# Patient Record
Sex: Male | Born: 1960 | Race: White | Hispanic: No | Marital: Single | Smoking: Current every day smoker
Health system: Southern US, Community
[De-identification: ages and names within clinical notes are randomized; demographics above are authoritative.]

## PROBLEM LIST (undated history)

## (undated) DIAGNOSIS — F1010 Alcohol abuse, uncomplicated: Secondary | ICD-10-CM

## (undated) DIAGNOSIS — M1990 Unspecified osteoarthritis, unspecified site: Secondary | ICD-10-CM

## (undated) DIAGNOSIS — Z720 Tobacco use: Secondary | ICD-10-CM

## (undated) DIAGNOSIS — E119 Type 2 diabetes mellitus without complications: Secondary | ICD-10-CM

## (undated) DIAGNOSIS — I4891 Unspecified atrial fibrillation: Secondary | ICD-10-CM

## (undated) DIAGNOSIS — I5022 Chronic systolic (congestive) heart failure: Secondary | ICD-10-CM

## (undated) DIAGNOSIS — C3490 Malignant neoplasm of unspecified part of unspecified bronchus or lung: Secondary | ICD-10-CM

## (undated) HISTORY — DX: Type 2 diabetes mellitus without complications: E11.9

## (undated) HISTORY — DX: Unspecified atrial fibrillation: I48.91

## (undated) HISTORY — DX: Chronic systolic (congestive) heart failure: I50.22

---

## 2000-10-22 ENCOUNTER — Encounter: Admission: RE | Admit: 2000-10-22 | Discharge: 2000-10-22 | Payer: Self-pay | Admitting: *Deleted

## 2000-10-22 ENCOUNTER — Encounter: Payer: Self-pay | Admitting: *Deleted

## 2011-01-17 ENCOUNTER — Other Ambulatory Visit: Payer: Self-pay

## 2011-01-17 ENCOUNTER — Emergency Department (HOSPITAL_COMMUNITY): Payer: BC Managed Care – PPO

## 2011-01-17 ENCOUNTER — Encounter: Payer: Self-pay | Admitting: Emergency Medicine

## 2011-01-17 ENCOUNTER — Inpatient Hospital Stay (HOSPITAL_COMMUNITY)
Admission: EM | Admit: 2011-01-17 | Discharge: 2011-01-22 | DRG: 544 | Disposition: A | Discharge status: Home / Self Care | Payer: BC Managed Care – PPO | Attending: Internal Medicine | Admitting: Internal Medicine

## 2011-01-17 DIAGNOSIS — I5022 Chronic systolic (congestive) heart failure: Secondary | ICD-10-CM

## 2011-01-17 DIAGNOSIS — E119 Type 2 diabetes mellitus without complications: Secondary | ICD-10-CM | POA: Diagnosis present

## 2011-01-17 DIAGNOSIS — Z6841 Body Mass Index (BMI) 40.0 and over, adult: Secondary | ICD-10-CM

## 2011-01-17 DIAGNOSIS — I429 Cardiomyopathy, unspecified: Secondary | ICD-10-CM

## 2011-01-17 DIAGNOSIS — I4891 Unspecified atrial fibrillation: Principal | ICD-10-CM | POA: Diagnosis present

## 2011-01-17 DIAGNOSIS — I5021 Acute systolic (congestive) heart failure: Secondary | ICD-10-CM | POA: Diagnosis present

## 2011-01-17 DIAGNOSIS — F17200 Nicotine dependence, unspecified, uncomplicated: Secondary | ICD-10-CM

## 2011-01-17 DIAGNOSIS — I509 Heart failure, unspecified: Secondary | ICD-10-CM | POA: Diagnosis present

## 2011-01-17 DIAGNOSIS — I519 Heart disease, unspecified: Secondary | ICD-10-CM | POA: Diagnosis present

## 2011-01-17 DIAGNOSIS — F1010 Alcohol abuse, uncomplicated: Secondary | ICD-10-CM | POA: Diagnosis present

## 2011-01-17 HISTORY — DX: Unspecified osteoarthritis, unspecified site: M19.90

## 2011-01-17 HISTORY — DX: Tobacco use: Z72.0

## 2011-01-17 HISTORY — DX: Alcohol abuse, uncomplicated: F10.10

## 2011-01-17 LAB — CBC
HCT: 42.4 % (ref 39.0–52.0)
Hemoglobin: 14.2 g/dL (ref 13.0–17.0)
MCH: 32.9 pg (ref 26.0–34.0)
MCHC: 33.5 g/dL (ref 30.0–36.0)
MCV: 98.4 fL (ref 78.0–100.0)
Platelets: 266 10*3/uL (ref 150–400)
RBC: 4.31 MIL/uL (ref 4.22–5.81)
RDW: 12.3 % (ref 11.5–15.5)
WBC: 9.2 10*3/uL (ref 4.0–10.5)

## 2011-01-17 LAB — DIFFERENTIAL
Basophils Absolute: 0.1 10*3/uL (ref 0.0–0.1)
Basophils Relative: 1 % (ref 0–1)
Lymphocytes Relative: 23 % (ref 12–46)
Monocytes Absolute: 1.1 10*3/uL — ABNORMAL HIGH (ref 0.1–1.0)
Neutro Abs: 5.8 10*3/uL (ref 1.7–7.7)
Neutrophils Relative %: 63 % (ref 43–77)

## 2011-01-17 LAB — PROTIME-INR: INR: 1.16 (ref 0.00–1.49)

## 2011-01-17 LAB — APTT: aPTT: 30 seconds (ref 24–37)

## 2011-01-17 LAB — COMPREHENSIVE METABOLIC PANEL
ALT: 18 U/L (ref 0–53)
AST: 16 U/L (ref 0–37)
Albumin: 3.5 g/dL (ref 3.5–5.2)
Alkaline Phosphatase: 98 U/L (ref 39–117)
CO2: 23 mEq/L (ref 19–32)
Chloride: 100 mEq/L (ref 96–112)
Creatinine, Ser: 0.68 mg/dL (ref 0.50–1.35)
GFR calc non Af Amer: >90 mL/min (ref >90)
Potassium: 4.1 mEq/L (ref 3.5–5.1)
Total Bilirubin: 1.3 mg/dL — ABNORMAL HIGH (ref 0.3–1.2)

## 2011-01-17 LAB — GLUCOSE, CAPILLARY: Glucose-Capillary: 173 mg/dL — ABNORMAL HIGH (ref 70–99)

## 2011-01-17 LAB — T4: T4, Total: 8.8 ug/dL (ref 5.0–12.5)

## 2011-01-17 LAB — CARDIAC PANEL(CRET KIN+CKTOT+MB+TROPI)
Relative Index: INVALID (ref 0.0–2.5)
Total CK: 71 U/L (ref 7–232)

## 2011-01-17 LAB — TSH: TSH: 1.561 u[IU]/mL (ref 0.350–4.500)

## 2011-01-17 MED ORDER — METOPROLOL TARTRATE 1 MG/ML IV SOLN
5.0000 mg | Freq: Four times a day (QID) | INTRAVENOUS | Status: DC
  Administered 2011-01-17 – 2011-01-19 (×7): 5 mg via INTRAVENOUS
  Filled 2011-01-17 (×10): qty 5

## 2011-01-17 MED ORDER — DIGOXIN 0.25 MG/ML IJ SOLN
0.5000 mg | Freq: Once | INTRAMUSCULAR | Status: AC
  Administered 2011-01-17: 0.5 mg via INTRAVENOUS
  Filled 2011-01-17: qty 2

## 2011-01-17 MED ORDER — SODIUM CHLORIDE 0.9 % IV SOLN
Freq: Once | INTRAVENOUS | Status: AC
Start: 2011-01-17 — End: 2011-01-17
  Administered 2011-01-17: 13:00:00 via INTRAVENOUS

## 2011-01-17 MED ORDER — WARFARIN SODIUM 10 MG PO TABS
10.0000 mg | ORAL_TABLET | Freq: Once | ORAL | Status: AC
  Administered 2011-01-18: 10 mg via ORAL
  Filled 2011-01-17: qty 1

## 2011-01-17 MED ORDER — HEPARIN (PORCINE) IN NACL 100-0.45 UNIT/ML-% IJ SOLN
2200.0000 [IU]/h | INTRAMUSCULAR | Status: DC
  Administered 2011-01-17: 1700 [IU]/h via INTRAVENOUS
  Administered 2011-01-18: 2200 [IU]/h via INTRAVENOUS
  Filled 2011-01-17 (×5): qty 250

## 2011-01-17 MED ORDER — DILTIAZEM HCL 100 MG IV SOLR
5.0000 mg/h | INTRAVENOUS | Status: AC
  Administered 2011-01-17: 5 mg/h via INTRAVENOUS
  Filled 2011-01-17: qty 100

## 2011-01-17 MED ORDER — DILTIAZEM HCL 25 MG/5ML IV SOLN
15.0000 mg | Freq: Once | INTRAVENOUS | Status: AC
  Administered 2011-01-17: 15 mg via INTRAVENOUS

## 2011-01-17 MED ORDER — HEPARIN BOLUS VIA INFUSION
5000.0000 [IU] | Freq: Once | INTRAVENOUS | Status: AC
  Administered 2011-01-17: 5000 [IU] via INTRAVENOUS
  Filled 2011-01-17: qty 5000

## 2011-01-17 MED ORDER — ALBUTEROL SULFATE (5 MG/ML) 0.5% IN NEBU
INHALATION_SOLUTION | RESPIRATORY_TRACT | Status: AC
  Administered 2011-01-17: 12:00:00
  Filled 2011-01-17: qty 1

## 2011-01-17 MED ORDER — DEXTROSE 5 % IV SOLN
5.0000 mg/h | INTRAVENOUS | Status: DC
  Administered 2011-01-17 – 2011-01-18 (×2): 5 mg/h via INTRAVENOUS
  Filled 2011-01-17 (×2): qty 100

## 2011-01-17 NOTE — ED Notes (Signed)
Ordered pt dinner tray and gave him a glass of water

## 2011-01-17 NOTE — ED Notes (Signed)
Attempt to call report x 3, RN remains unable.  Asked to speak to charge RN, was told no.  ED CN made aware, called unit and was told that the RN was going to call back in 10 minutes.

## 2011-01-17 NOTE — Progress Notes (Signed)
ANTICOAGULATION CONSULT NOTE - Initial Consult  Pharmacy Consult for Heparin Indication: atrial fibrillation  No Known Allergies  Patient Measurements: Height: 5\' 10"  (177.8 cm) Weight: 310 lb (140.615 kg) IBW/kg (Calculated) : 73  Adjusted Body Weight: 106 kg  Vital Signs: Temp: 98 F (36.7 C) (11/23 1820) Temp src: Oral (11/23 1820) BP: 126/89 mmHg (11/23 1828) Pulse Rate: 109  (11/23 1820)  Labs:  Basename 01/17/11 1251 01/17/11 1247  HGB -- 14.2  HCT -- 42.4  PLT -- 266  APTT -- --  LABPROT -- --  INR -- --  HEPARINUNFRC -- --  CREATININE -- 0.68  CKTOTAL 71 --  CKMB 4.0 --  TROPONINI <0.30 --   Estimated Creatinine Clearance: 152.8 ml/min (by C-G formula based on Cr of 0.68).  Medical History: Past Medical History  Diagnosis Date  . Hyperglycemia     Noted in ER this admission. Strong family history of DM.  Marland Kitchen Alcohol abuse   . Tobacco abuse     Medications:  None PTA  Assessment: 50 yo male admitted with Afib/RVR - to be started on anticoagulation.  He has known history for ETOH use (8cans/week) but LFT's/Tbili stable.  He is morbid obese and heparin will be dosed using adjusted body weight.  Goal of Therapy:  Heparin level 0.3-0.7 units/ml INR 2.0-3.0   Plan:  1)  Heparin 5000 units IV bolus x1 2)  Heparin 1700 units/hr 3)  Warfarin 10mg  x 1 4)  Heparin level in six hours 5)  PT/INR daily 6)  Monitor for s/s of bleeding.  Nadara Mustard [place] 01/17/2011,6:48 PM

## 2011-01-17 NOTE — ED Notes (Signed)
Spoke with pt about taking asa, pt continues to refuse due to an ulcer that he has.  Pt reassured.

## 2011-01-17 NOTE — ED Notes (Signed)
Attempt to call report x 2, RN hanging blood, unable.  To call back.

## 2011-01-17 NOTE — ED Notes (Signed)
Attempt to call report x 1, RN unable.  

## 2011-01-17 NOTE — ED Provider Notes (Signed)
History     CSN: [id number] Arrival date & time: 01/17/2011 11:55 AM   First MD Initiated Contact with Patient 01/17/11 1220      Chief Complaint  Patient presents with  . Atrial Fibrillation  . Wheezing    (Consider location/radiation/quality/duration/timing/severity/associated sxs/prior treatment) Patient is a 50 y.o. male presenting with atrial fibrillation and wheezing. The history is provided by the patient.  Atrial Fibrillation  Wheezing  Associated symptoms include wheezing.   patient with acute onset of his dyspnea for 48 hours. He notes orthopnea and dyspnea on exertion. Denies any chest pain or chest pressure. Does note some lower extremity swelling, it denies any palpitations. No syncopal events noted. No prior history of atrial fibrillation or congestive heart failure. Patient went to his primary care doctor's office today where he was found to be in possible heart failure with wheezing and new onset atrial fibrillation, EMS was called patient given oxygen and transported here.  History reviewed. No pertinent past medical history.  No past surgical history on file.  No family history on file.  History  Substance Use Topics  . Smoking status: Not on file  . Smokeless tobacco: Not on file  . Alcohol Use: Not on file      Review of Systems  Respiratory: Positive for wheezing.   All other systems reviewed and are negative.    Allergies  Review of patient's allergies indicates no known allergies.  Home Medications  No current outpatient prescriptions on file.  BP 126/95  Pulse 164  Temp(Src) 98 F (36.7 C) (Oral)  Resp 22  SpO2 97%  Physical Exam  Nursing note and vitals reviewed. Constitutional: He is oriented to person, place, and time. He appears well-developed and well-nourished.  Non-toxic appearance. No distress.  HENT:  Head: Normocephalic and atraumatic.  Eyes: Conjunctivae, EOM and lids are normal. Pupils are equal, round, and reactive to  light.  Neck: Normal range of motion. Neck supple. No tracheal deviation present. No mass present.  Cardiovascular: Normal heart sounds.  An irregularly irregular rhythm present. Tachycardia present.  Exam reveals no gallop.   No murmur heard. Pulmonary/Chest: No stridor. Tachypnea noted. No respiratory distress. He has no decreased breath sounds. He has wheezes. He has rhonchi. He has no rales.  Abdominal: Soft. Normal appearance and bowel sounds are normal. He exhibits no distension. There is no tenderness. There is no rebound and no CVA tenderness.  Musculoskeletal: Normal range of motion. He exhibits no edema and no tenderness.  Neurological: He is alert and oriented to person, place, and time. He has normal strength. No cranial nerve deficit or sensory deficit. GCS eye subscore is 4. GCS verbal subscore is 5. GCS motor subscore is 6.  Skin: Skin is warm and dry. No abrasion and no rash noted.  Psychiatric: He has a normal mood and affect. His speech is normal and behavior is normal.    ED Course  Procedures (including critical care time)   Labs Reviewed  CBC  DIFFERENTIAL  COMPREHENSIVE METABOLIC PANEL  PRO B NATRIURETIC PEPTIDE  CARDIAC PANEL(CRET KIN+CKTOT+MB+TROPI)   No results found.   No diagnosis found.    MDM   Date: 01/17/2011  Rate: 156  Rhythm: atrial fibrillation  QRS Axis: normal  Intervals: normal  ST/T Wave abnormalities: nonspecific ST changes  Conduction Disutrbances:none  Narrative Interpretation:   Old EKG Reviewed: none available   2:05 PM Patient given Cardizem IV push 20 mg and started on a Cardizem drip at 5 mg  per minute. We assessed 30 minutes later and remains in A. fib with a rapid response of between 1:30 to 140. The patient's Cardizem drip rate increased to 2 mg per minute. Patient remains hemodynamically stable. Discussed with cardiology will come to admit patient  CRITICAL CARE Performed by: Toy Baker   Total critical care  time: 75  Critical care time was exclusive of separately billable procedures and treating other patients.  Critical care was necessary to treat or prevent imminent or life-threatening deterioration.  Critical care was time spent personally by me on the following activities: development of treatment plan with patient and/or surrogate as well as nursing, discussions with consultants, evaluation of patient's response to treatment, examination of patient, obtaining history from patient or surrogate, ordering and performing treatments and interventions, ordering and review of laboratory studies, ordering and review of radiographic studies, pulse oximetry and re-evaluation of patient's condition.        Toy Baker, MD 01/17/11 [phone]

## 2011-01-17 NOTE — ED Notes (Signed)
Pt here with wheezing and SOB for 1 month, went to dr today for bilateral swollen lower extremities for past 3 days.  Put on monitor at dr's and found to be in afib with rvr- rate 150-180

## 2011-01-17 NOTE — ED Notes (Signed)
Assumed care of pt.  No distress noted.  Pt sitting on the side of bed.  Pt denies needs, SOB or pain.  Skin warm, dry and intact.  Updated on POC.

## 2011-01-17 NOTE — H&P (Signed)
History and Physical  Patient ID: Bryan Rice MRN: [id number], DOB/AGE: [birth year] 52 y.o. Date of Encounter: 01/17/2011  Primary Physician: No current PCP. Today was his first appointment with Dr. Cathey Endow. Primary Cardiologist: New, being seen by Dr. Tenny Craw  Chief Complaint: shortness of breath Reason for Admission: new onset atrial fibrillation with RVR  HPI: Mr. Bryan Rice is a 50 y/o M with no prior cardiac history but hx of EtOH abuse, tobacco abuse, and hyperglycemia noted here in the ER who presents with a 3-week history of worsening dyspnea on exertion. He denies any SOB at rest, but has noted over the last several days he is unable to do his ADLs without becoming quite dyspneic. He also has had a nonproductive cough and possibly some orthopnea. About 3 weeks ago, he had several episodes of PND during the night which he attributed to a clogged furnace filter, but even after changing it, he still hasn't felt back to baseline. 2 days ago he woke up with "elephant legs" (LEE) which was much worse than usual. This has since gone down, but he made an appointment to establish care today with a PCP to be evaluated. At that visit he was found to be in atrial fibrillation with RVR, HR in the 160's. He was subsequently transferred to [facility]. He received 20mg  IV diltiazem bolus followed by 5mg /hr gtt which was uptitrated to 10mg /hr, and is receiving another 15mg  bolus per the EDP since his HR is still in the 130's. The patient is completely asymptomatic at rest. He is unaware of his heart rhythm/rate and denies any palpitations or chest pain whatsoever. CE's neg x 1, BNP 1218, glucose 170, and CXR shows a R pleural effusion.  Past Medical History  Diagnosis Date  . Hyperglycemia     Noted in ER this admission. Strong family history of DM.  Marland Kitchen Alcohol abuse   . Tobacco abuse    No prior cardiac workup, or history of bleeding problems or TIA/CVA  Surgical History: None  Home Meds:  None  Allergies: No Known Allergies  History   Social History  . Marital Status: Single. Lives alone   Occupational History  . Service technician     Working with [employer]   Social History Main Topics  . Smoking status: Current Everyday Smoker -- 2.0 packs/day for 35 years    Types: Cigarettes  . Smokeless tobacco: Not on file  . Alcohol Use: 4.8 oz/week    8 Cans of beer per week     In winter, 4 beers 1-2x/week. In summer, >6 beers daily.  . Drug Use: No  . Sexually Active: Not on file   Family History  Problem Relation Age of Onset  . Diabetes Father     Multiple relatives in his family as well  . Peripheral vascular disease Mother     Died from complications from an aortic surgery   Review of Systems: General: negative for chills, fever, night sweats Cardiovascular: negative for chest pain or palpitations. Positive for dyspnea on exertion, edema, orthopnea, paroxysmal nocturnal dyspnea Dermatological: negative for rash Respiratory: Positive for nonproductive cough Urologic: negative for hematuria Abdominal: negative for nausea, vomiting, diarrhea, bright red blood per rectum, melena, or hematemesis Neurologic: negative for visual changes, syncope, or dizziness All other systems reviewed and are otherwise negative except as noted above.  Labs:   Lab Results  Component Value Date   WBC 9.2 01/17/2011   HGB 14.2 01/17/2011  HCT 42.4 01/17/2011   MCV 98.4 01/17/2011   PLT 266 01/17/2011    Lab 01/17/11 1247  NA 134*  K 4.1  CL 100  CO2 23  BUN 10  CREATININE 0.68  CALCIUM 9.4  PROT 7.3  BILITOT 1.3*  ALKPHOS 98  ALT 18  AST 16  GLUCOSE 170*    Basename 01/17/11 1251  CKTOTAL 71  CKMB 4.0  TROPONINI <0.30   Radiology/Studies:  1. Chest 2 View 01/17/2011 IMPRESSION:  1.  Right pleural effusion with decreased aeration to the right lung base.     EKG: Atrial fibrillation with RVR 156 bpm with Q's anteriorly  and NSST changes (difficult to tell, but appears that he has TWI I, avL, V5-V6). Otherwise no acute changes  Physical Exam: Blood pressure 126/95, pulse 164, temperature 98 F (36.7 C), temperature source Oral, resp. rate 22, SpO2 97.00%. General: Overweight WM, well developed in no acute distress. Head: Normocephalic, atraumatic, sclera are mildly icteric. No xanthomas, nares are without discharge.  Neck: Negative for carotid bruits. JVD is elevated. Lungs: Absent breath sounds at the right base to about 1/3 of the way up. Faint rales right base. Otherwise no wheezes or rhonchi. Breathing is unlabored while sitting stationary, but does have noticeable increased WOB after changing positions Heart: Irregularly irregular, tachycardic with S1 S2. No murmurs, rubs, or gallops appreciated. Abdomen: Obese, soft, non-tender, non-distended with normoactive bowel sounds. No hepatomegaly. No rebound/guarding. No obvious abdominal masses. Msk:  Strength and tone appear normal for age. Extremities: No clubbing or cyanosis. 1+ LEE with some erythematous stasis changes on LEs.  Distal pedal pulses are 1+ and equal bilaterally. Neuro: Alert and oriented X 3. Moves all extremities spontaneously. Psych:  Responds to questions appropriately with a normal affect.   ASSESSMENT AND PLAN:  1. New onset atrial fibrillation with RVR, duration unknown. This is likely what has been driving his underlying DOE. CHADS2 score pre-admission would've been 0, but he may have newly diagnosed CHF and diabetes this admission. Will discuss anticoagulation with MD in light of his EtOH use (although H/H are normal). He needs a different strategy for rate control as diltiazem doesn't seem to be slowing him down. 2. Possible acute diastolic CHF, although EF is unknown. 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 Has right pleural effusion on CXR, recent increase in LEE. Check 2D echocardiogram. Will discuss possible  diuresis with MD. 3. Hyperglycemia - check A1C to rule out diabetes mellitus. 4. EtOH abuse - place on CIWA protocol. Patient was counseled on cutting down. Elevated total bilirubin at baseline and patient's sclera and mildly icteric. Check baseline coags.  5. Tobacco abuse - patient counseled regarding cessation.  ADDENDUM: patient seen and examined with Dr. Tenny Craw - see below for her comments. EF is down on bedside echo. Will decrease dilt and rx digoxin, IV lopressor, IV Lasix 40mg  bid and check formal echo. Will start heparin/Coumadin as well.  Signed, Dayna Dunn PA-C 01/17/2011, 2:52 PM I have seen and examined patient.  Worsening SOB>  Portable echo shows severe LV dysfunction. Plan to Rx with b blocker/heparin/coumadin.  ON exam evidence of marked volume overload.  Plan to diurese with IV lasix.   If rates cannot be controlled TEE cardioversion.  OHterwise cardiovert after 4 wks of adeq anticoagulation.

## 2011-01-18 LAB — PROTIME-INR
INR: 1.16 (ref 0.00–1.49)
INR: 1.16 (ref 0.00–1.49)
Prothrombin Time: 15 seconds (ref 11.6–15.2)
Prothrombin Time: 15 seconds (ref 11.6–15.2)

## 2011-01-18 LAB — HEMOGLOBIN A1C: Hgb A1c MFr Bld: 7.1 % — ABNORMAL HIGH (ref <5.7)

## 2011-01-18 LAB — BASIC METABOLIC PANEL
Chloride: 101 mEq/L (ref 96–112)
Creatinine, Ser: 0.77 mg/dL (ref 0.50–1.35)
GFR calc Af Amer: >90 mL/min (ref >90)
GFR calc non Af Amer: >90 mL/min (ref >90)
Potassium: 3.8 mEq/L (ref 3.5–5.1)

## 2011-01-18 LAB — CARDIAC PANEL(CRET KIN+CKTOT+MB+TROPI)
CK, MB: 3.3 ng/mL (ref 0.3–4.0)
Relative Index: INVALID (ref 0.0–2.5)
Troponin I: <0.3 ng/mL (ref <0.30)
Troponin I: <0.3 ng/mL (ref <0.30)

## 2011-01-18 LAB — GLUCOSE, CAPILLARY: Glucose-Capillary: 153 mg/dL — ABNORMAL HIGH (ref 70–99)

## 2011-01-18 MED ORDER — HEPARIN (PORCINE) IN NACL 100-0.45 UNIT/ML-% IJ SOLN
2800.0000 [IU]/h | INTRAMUSCULAR | Status: DC
  Administered 2011-01-18 – 2011-01-20 (×5): 2600 [IU]/h via INTRAVENOUS
  Administered 2011-01-21 – 2011-01-22 (×3): 2800 [IU]/h via INTRAVENOUS
  Filled 2011-01-18 (×13): qty 250

## 2011-01-18 MED ORDER — WARFARIN VIDEO
Freq: Once | Status: AC
  Administered 2011-01-18: 12:00:00
  Filled 2011-01-18: qty 1

## 2011-01-18 MED ORDER — LORAZEPAM 2 MG/ML IJ SOLN: 1.0000 mg | Freq: Four times a day (QID) | INTRAMUSCULAR | Status: AC | PRN

## 2011-01-18 MED ORDER — FOLIC ACID 1 MG PO TABS
1.0000 mg | ORAL_TABLET | Freq: Every day | ORAL | Status: DC
  Administered 2011-01-18 – 2011-01-22 (×5): 1 mg via ORAL
  Filled 2011-01-18 (×5): qty 1

## 2011-01-18 MED ORDER — SODIUM CHLORIDE 0.9 % IJ SOLN
3.0000 mL | Freq: Two times a day (BID) | INTRAMUSCULAR | Status: DC
  Administered 2011-01-18 – 2011-01-22 (×3): 3 mL via INTRAVENOUS

## 2011-01-18 MED ORDER — LORAZEPAM 1 MG PO TABS: 0.0000 mg | ORAL_TABLET | Freq: Two times a day (BID) | ORAL | Status: AC

## 2011-01-18 MED ORDER — WARFARIN SODIUM 10 MG PO TABS
10.0000 mg | ORAL_TABLET | Freq: Once | ORAL | Status: AC
  Administered 2011-01-18: 10 mg via ORAL
  Filled 2011-01-18: qty 1

## 2011-01-18 MED ORDER — DIGOXIN 0.25 MG/ML IJ SOLN
0.2500 mg | Freq: Every day | INTRAMUSCULAR | Status: AC
  Administered 2011-01-18: 0.25 mg via INTRAVENOUS
  Filled 2011-01-18 (×2): qty 1

## 2011-01-18 MED ORDER — VITAMIN B-1 100 MG PO TABS
100.0000 mg | ORAL_TABLET | Freq: Every day | ORAL | Status: DC
  Administered 2011-01-18 – 2011-01-22 (×5): 100 mg via ORAL
  Filled 2011-01-18 (×5): qty 1

## 2011-01-18 MED ORDER — THERA M PLUS PO TABS
1.0000 | ORAL_TABLET | Freq: Every day | ORAL | Status: DC
  Administered 2011-01-18 – 2011-01-22 (×5): 1 via ORAL
  Filled 2011-01-18 (×6): qty 1

## 2011-01-18 MED ORDER — ONDANSETRON HCL 4 MG/2ML IJ SOLN: 4.0000 mg | Freq: Four times a day (QID) | INTRAMUSCULAR | Status: DC | PRN

## 2011-01-18 MED ORDER — HEPARIN BOLUS VIA INFUSION
4000.0000 [IU] | Freq: Once | INTRAVENOUS | Status: AC
  Administered 2011-01-18: 4000 [IU] via INTRAVENOUS
  Filled 2011-01-18: qty 4000

## 2011-01-18 MED ORDER — SODIUM CHLORIDE 0.9 % IV SOLN: 250.0000 mL | INTRAVENOUS | Status: DC

## 2011-01-18 MED ORDER — COUMADIN BOOK
Freq: Once | Status: AC
  Administered 2011-01-18: 01:00:00
  Filled 2011-01-18: qty 1

## 2011-01-18 MED ORDER — LORAZEPAM 1 MG PO TABS: 1.0000 mg | ORAL_TABLET | Freq: Four times a day (QID) | ORAL | Status: AC | PRN

## 2011-01-18 MED ORDER — ASPIRIN EC 81 MG PO TBEC
81.0000 mg | DELAYED_RELEASE_TABLET | Freq: Every day | ORAL | Status: DC
  Administered 2011-01-18 – 2011-01-22 (×5): 81 mg via ORAL
  Filled 2011-01-18 (×5): qty 1

## 2011-01-18 MED ORDER — DIGOXIN 250 MCG PO TABS
0.2500 mg | ORAL_TABLET | Freq: Every day | ORAL | Status: DC
  Administered 2011-01-18 – 2011-01-22 (×5): 0.25 mg via ORAL
  Filled 2011-01-18 (×5): qty 1

## 2011-01-18 MED ORDER — THIAMINE HCL 100 MG/ML IJ SOLN
100.0000 mg | Freq: Every day | INTRAMUSCULAR | Status: DC
  Filled 2011-01-18 (×5): qty 1

## 2011-01-18 MED ORDER — LORAZEPAM 1 MG PO TABS
0.0000 mg | ORAL_TABLET | Freq: Four times a day (QID) | ORAL | Status: AC
  Administered 2011-01-18 – 2011-01-19 (×4): 1 mg via ORAL
  Filled 2011-01-18 (×4): qty 1

## 2011-01-18 MED ORDER — ACETAMINOPHEN 325 MG PO TABS: 650.0000 mg | ORAL_TABLET | ORAL | Status: DC | PRN

## 2011-01-18 MED ORDER — DIGOXIN 0.25 MG/ML IJ SOLN
0.5000 mg | Freq: Once | INTRAMUSCULAR | Status: AC
  Administered 2011-01-18: 0.5 mg via INTRAVENOUS
  Filled 2011-01-18: qty 2

## 2011-01-18 MED ORDER — HEPARIN BOLUS VIA INFUSION
3000.0000 [IU] | Freq: Once | INTRAVENOUS | Status: AC
  Administered 2011-01-18: 3000 [IU] via INTRAVENOUS
  Filled 2011-01-18: qty 3000

## 2011-01-18 MED ORDER — SODIUM CHLORIDE 0.9 % IJ SOLN: 3.0000 mL | INTRAMUSCULAR | Status: DC | PRN

## 2011-01-18 MED ORDER — FUROSEMIDE 10 MG/ML IJ SOLN
40.0000 mg | Freq: Two times a day (BID) | INTRAMUSCULAR | Status: DC
  Administered 2011-01-18 – 2011-01-21 (×9): 40 mg via INTRAVENOUS
  Filled 2011-01-18 (×12): qty 4

## 2011-01-18 NOTE — Progress Notes (Signed)
Patient seen and examined with Ms. [name].  He has rates that are better controlled at this point.  Will stop diltiazem presently, continue IV metoprolol until tomorrow, then transition to PO.  He will need rate control, with eventual conversion to NSR.   His dentition is also poor, and we will get dental consult Monday if he is still here.    Bryan Rice 01/18/2011 2:28 PM

## 2011-01-18 NOTE — Progress Notes (Signed)
ANTICOAGULATION CONSULT NOTE - Follow Up Consult  Pharmacy Consult for heparin Indication: atrial fibrillation  No Known Allergies  Patient Measurements: Height: 5\' 10"  (177.8 cm) Weight: 312 lb 9.8 oz (141.8 kg) (Scale A/Retake perRN) IBW/kg (Calculated) : 73  Adjusted Body Weight: 106.4  Vital Signs: Temp: 97.1 F (36.2 C) (11/23 2125) Temp src: Oral (11/23 2125) BP: 97/67 mmHg (11/23 2125) Pulse Rate: 91  (11/23 2125)  Labs:  Basename 01/18/11 0207 01/17/11 1828 01/17/11 1251 01/17/11 1247  HGB -- -- -- 14.2  HCT -- -- -- 42.4  PLT -- -- -- 266  APTT -- 30 -- --  LABPROT 15.0 15.0 -- --  INR 1.16 1.16 -- --  HEPARINUNFRC <0.10* -- -- --  CREATININE -- -- -- 0.68  CKTOTAL -- -- 71 --  CKMB -- -- 4.0 --  TROPONINI -- -- <0.30 --   Estimated Creatinine Clearance: 153.5 ml/min (by C-G formula based on Cr of 0.68).   Medications:  Scheduled:    . sodium chloride   Intravenous Once  . albuterol      . aspirin EC  81 mg Oral Daily  . coumadin book   Does not apply Once  . digoxin  0.5 mg Intravenous Once  . digoxin  0.5 mg Intravenous Once  . digoxin  0.25 mg Oral Daily  . diltiazem (CARDIZEM) infusion  5-15 mg/hr Intravenous To Major  . diltiazem  15 mg Intravenous Once  . folic acid  1 mg Oral Daily  . furosemide  40 mg Intravenous BID  . heparin  3,000 Units Intravenous Once  . heparin  5,000 Units Intravenous Once  . LORazepam  0-4 mg Oral Q6H   Followed by  . LORazepam  0-4 mg Oral Q12H  . metoprolol  5 mg Intravenous Q6H  . multivitamins ther. w/minerals  1 tablet Oral Daily  . sodium chloride  3 mL Intravenous Q12H  . thiamine  100 mg Oral Daily   Or  . thiamine  100 mg Intravenous Daily  . warfarin  10 mg Oral Once  . warfarin   Does not apply Once   Infusions:    . sodium chloride    . diltiazem (CARDIZEM) infusion 5 mg/hr (01/17/11 1823)  . heparin 1,700 Units/hr (01/17/11 1850)   Assessment: 50yo male undetectable on heparin with initial  dosing for Afib w/ RVR.  Goal of Therapy:  Heparin level 0.3-0.7 units/ml   Plan:  Will give bolus of 3000 units followed by rate increase of ~4 units/kg/hr to 2200 units/hr and check level in 6hr.  Colleen Can PharmD BCPS 01/18/2011,3:59 AM

## 2011-01-18 NOTE — Progress Notes (Signed)
INITIAL ADULT NUTRITION ASSESSMENT Date: 01/18/2011   Time: 3:41 PM Reason for Assessment: consult new onset heart failure  ASSESSMENT: Male 50 y.o.  Dx: new onset Afib, possible acute systolic CHF  Hx:  Past Medical History  Diagnosis Date  . Hyperglycemia     Noted in ER this admission. Strong family history of DM.  Marland Kitchen Alcohol abuse   . Tobacco abuse    Related Meds:     . aspirin EC  81 mg Oral Daily  . coumadin book   Does not apply Once  . digoxin  0.25 mg Intravenous Daily  . digoxin  0.5 mg Intravenous Once  . digoxin  0.5 mg Intravenous Once  . digoxin  0.25 mg Oral Daily  . folic acid  1 mg Oral Daily  . furosemide  40 mg Intravenous BID  . heparin  3,000 Units Intravenous Once  . heparin  5,000 Units Intravenous Once  . LORazepam  0-4 mg Oral Q6H   Followed by  . LORazepam  0-4 mg Oral Q12H  . metoprolol  5 mg Intravenous Q6H  . multivitamins ther. w/minerals  1 tablet Oral Daily  . sodium chloride  3 mL Intravenous Q12H  . thiamine  100 mg Oral Daily   Or  . thiamine  100 mg Intravenous Daily  . warfarin  10 mg Oral Once  . warfarin  10 mg Oral ONCE-1800  . warfarin   Does not apply Once   Ht: 5\' 10"  (177.8 cm)  Wt: 312 lb 9.8 oz (141.8 kg) (Scale A/Retake perRN)  Ideal Wt: 75.3 kg % Ideal Wt: 188%  Usual Wt: n/a % Usual Wt: n/a  Body mass index is 44.86 kg/(m^2). consistent with morbid obesity  Food/Nutrition Related Hx: Regular diet PTA.  8 cans beer per week in winter, >6 beers daily in summer  Labs: A1c 7.1  CBG (last 3)   Basename 01/17/11 2240 01/17/11 2036  GLUCAP 153* 173*   BMET    Component Value Date/Time   NA 136 01/18/2011 0700   K 3.8 01/18/2011 0700   CL 101 01/18/2011 0700   CO2 25 01/18/2011 0700   GLUCOSE 141* 01/18/2011 0700   BUN 11 01/18/2011 0700   CREATININE 0.77 01/18/2011 0700   CALCIUM 9.1 01/18/2011 0700   GFRNONAA >90 01/18/2011 0700   GFRAA >90 01/18/2011 0700    Intake/Output Summary (Last 24  hours) at 01/18/11 1546 Last data filed at 01/18/11 1200  Gross per 24 hour  Intake 544.47 ml  Output   1750 ml  Net -1205.53 ml   Diet Order: Cardiac, 2 g Na, 1500 ml FR  Supplements/Tube Feeding:  none  IVF:    sodium chloride   heparin Last Rate: 2,200 Units/hr (01/18/11 0900)  DISCONTD: diltiazem (CARDIZEM) infusion Last Rate: 5 mg/hr (01/18/11 1327)    Estimated Nutritional Needs:   Kcal: 2000-2200 Protein: 75-110 Fluid: 1.5 L fluid restriction  NUTRITION DIAGNOSIS: -Food and nutrition related knowledge deficit (NB-1.1).  Status: Ongoing  RELATED TO: no previous education  AS EVIDENCE BY: new onset heart failure, hyperglycemia noted this admission  MONITORING/EVALUATION(Goals): Compliance with therapeutic diet restrictions to promote normoglycemia and gradual weight loss  EDUCATION NEEDS: -Education needs addressed  INTERVENTION: Education provided on heart healthy diet and general, healthful eating patterns. RD to follow for further ed needs and changes in plan of care.   Dietitian [phone]  DOCUMENTATION CODES Per approved criteria  -Morbid Obesity    Sanjuan Dame, Sheliah Hatch 01/18/2011,  3:41 PM

## 2011-01-18 NOTE — Progress Notes (Addendum)
Patient Name: Bryan Rice Date of Encounter: 01/18/2011  Subjective: Feels as though breathing is better with ambulation, but not back to baseline. LEE is slightly better. Vigorous UOP. No CP/palps.  Objective: Telemetry: Continues to be in afib RVR - occasionally up to 150's (likely with ambulation) and now 90's-100's.  Physical Exam: T97.1, P 110, BP 97/67 (range is this - 126/90), RR 20, POx 93% RA General: Overweight WM, well developed in no acute distress.  Head: Normocephalic, atraumatic, sclera are mildly icteric. No xanthomas, nares are without discharge.  Neck: Negative for carotid bruits. JVD is elevated.  Lungs: Decreased breath sounds at the right base to about 1/3 of the way up but definitely more aeration than yesterday. Clear left side. Otherwise no wheezes or rhonchi.  Heart: Irregularly irregular, slightly tachycardic with S1 S2. No murmurs, rubs, or gallops appreciated.  Abdomen: Obese, soft, non-tender, non-distended with normoactive bowel sounds. No hepatomegaly. No rebound/guarding. No obvious abdominal masses.  Msk: Strength and tone appear normal for age.  Extremities: No clubbing or cyanosis. 1+ LEE with some erythematous stasis changes on LEs. Distal pedal pulses are 1+ and equal bilaterally.  Neuro: Alert and oriented X 3. Moves all extremities spontaneously.  Psych: Responds to questions appropriately with a normal affect.    Intake/Output Summary (Last 24 hours) at 01/18/11 1401 Last data filed at 01/18/11 1200  Gross per 24 hour  Intake 544.47 ml  Output   1750 ml  Net -1205.53 ml  Patient reports almost 2L UOP, states they started keeping track late.  Labs:  [facility] 01/18/11 0700 01/17/11 1247  NA 136 134*  K 3.8 4.1  CL 101 100  CO2 25 23  GLUCOSE 141* 170*  BUN 11 10  CREATININE 0.77 0.68  CALCIUM 9.1 9.4  MG -- --  PHOS -- --    Basename 01/17/11 1247  AST 16  ALT 18  ALKPHOS 98  BILITOT 1.3*  PROT 7.3  ALBUMIN 3.5     Basename  01/17/11 1247  WBC 9.2  NEUTROABS 5.8  HGB 14.2  HCT 42.4  MCV 98.4  PLT 266    Basename 01/18/11 0700 01/17/11 1251  CKTOTAL 64 71  CKMB 3.6 4.0  TROPONINI <0.30 <0.30    Basename 01/18/11 0700 01/17/11 1250  POCBNP 1014.0* 1218.0*    No results found for this basename: HGBA1C in the last 72 hours -- pending No results found for this basename: CHOL,HDL,LDLCALC,TRIG,CHOLHDL in the last 72 hours - pending  Basename 01/17/11 1405  TSH 1.561  T4TOTAL 8.8  T3FREE --  THYROIDAB --     Radiology/Studies:  Dg Chest 2 View 01/17/2011    IMPRESSION:  1.  Right pleural effusion with decreased aeration to the right lung base.    Assessment and Plan: 1. New onset atrial fibrillation with RVR, duration unknown. This is likely what has been driving his underlying DOE. On heparin -> Coumadin. Needs improved rate control. Will discuss with Dr. Riley Kill - will likely consider an additional dose of IV digoxin. D/C diltiazem as I don't think it is doing much at this point, and is not an ideal long-term med for him if his EF is indeed down.  2. Possible acute systolic CHF - EF down on bedside echo. 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 2D echo pending. Continue IV Lasix another day. Hold off on ACEI with hypotension.  3. Hyperglycemia - A1C pending.  4. EtOH abuse - on CIWA protocol. Patient was counseled  on cutting down. Elevated total bilirubin at baseline and patient's sclera and mildly icteric.   5. Tobacco abuse - patient counseled regarding cessation. Offered nicotine patch but he declined.   Signed, Ronie Spies PA-C

## 2011-01-18 NOTE — Progress Notes (Signed)
ANTICOAGULATION CONSULT NOTE - Follow Up Consult  Pharmacy Consult for heparin Indication: atrial fibrillation  No Known Allergies  Patient Measurements: Height: 5\' 10"  (177.8 cm) Weight: 312 lb 9.8 oz (141.8 kg) (Scale A/Retake perRN) IBW/kg (Calculated) : 73  Adjusted Body Weight: 106.4  Vital Signs: Temp: 97.2 F (36.2 C) (11/24 1545) Temp src: Oral (11/24 1545) BP: 118/62 mmHg (11/24 1545) Pulse Rate: 81  (11/24 1545)  Labs:  Basename 01/18/11 1535 01/18/11 0900 01/18/11 0700 01/18/11 0207 01/17/11 1828 01/17/11 1251 01/17/11 1247  HGB -- -- -- -- -- -- 14.2  HCT -- -- -- -- -- -- 42.4  PLT -- -- -- -- -- -- 266  APTT -- -- -- -- 30 -- --  LABPROT -- -- 15.0 15.0 15.0 -- --  INR -- -- 1.16 1.16 1.16 -- --  HEPARINUNFRC 0.11* <0.10* -- <0.10* -- -- --  CREATININE -- -- 0.77 -- -- -- 0.68  CKTOTAL 62 -- 64 -- -- 71 --  CKMB 3.3 -- 3.6 -- -- 4.0 --  TROPONINI <0.30 -- <0.30 -- -- <0.30 --   Estimated Creatinine Clearance: 153.5 ml/min (by C-G formula based on Cr of 0.77).   Medications:  Scheduled:     . aspirin EC  81 mg Oral Daily  . coumadin book   Does not apply Once  . digoxin  0.25 mg Intravenous Daily  . digoxin  0.5 mg Intravenous Once  . digoxin  0.25 mg Oral Daily  . folic acid  1 mg Oral Daily  . furosemide  40 mg Intravenous BID  . heparin  3,000 Units Intravenous Once  . heparin  5,000 Units Intravenous Once  . LORazepam  0-4 mg Oral Q6H   Followed by  . LORazepam  0-4 mg Oral Q12H  . metoprolol  5 mg Intravenous Q6H  . multivitamins ther. w/minerals  1 tablet Oral Daily  . sodium chloride  3 mL Intravenous Q12H  . thiamine  100 mg Oral Daily   Or  . thiamine  100 mg Intravenous Daily  . warfarin  10 mg Oral Once  . warfarin  10 mg Oral ONCE-1800  . warfarin   Does not apply Once   Infusions:     . sodium chloride    . heparin 2,200 Units/hr (01/18/11 0900)  . DISCONTD: diltiazem (CARDIZEM) infusion 5 mg/hr (01/18/11 1327)    Assessment: Afib with RVR: Heparin level remains subtherapeutic but is finally detectable after multiple rate increases.  Goal of Therapy:  Heparin level 0.3-0.7 units/ml   Plan:  Heparin 4000 units IV bolus Increase Heparin drip to 2600 units/hr Check Heparin level in 6 hours.  Estella Husk, Pharm.D., BCPS Clinical Pharmacist  Pager [phone] 01/18/2011, 5:43 PM

## 2011-01-19 DIAGNOSIS — I5022 Chronic systolic (congestive) heart failure: Secondary | ICD-10-CM

## 2011-01-19 DIAGNOSIS — E119 Type 2 diabetes mellitus without complications: Secondary | ICD-10-CM

## 2011-01-19 DIAGNOSIS — F17200 Nicotine dependence, unspecified, uncomplicated: Secondary | ICD-10-CM

## 2011-01-19 DIAGNOSIS — I4891 Unspecified atrial fibrillation: Principal | ICD-10-CM

## 2011-01-19 DIAGNOSIS — I369 Nonrheumatic tricuspid valve disorder, unspecified: Secondary | ICD-10-CM

## 2011-01-19 LAB — BASIC METABOLIC PANEL
BUN: 12 mg/dL (ref 6–23)
CO2: 28 mEq/L (ref 19–32)
Calcium: 9.3 mg/dL (ref 8.4–10.5)
Calcium: 9.2 mg/dL (ref 8.4–10.5)
Chloride: 95 mEq/L — ABNORMAL LOW (ref 96–112)
Creatinine, Ser: 0.78 mg/dL (ref 0.50–1.35)
Creatinine, Ser: 0.76 mg/dL (ref 0.50–1.35)
GFR calc non Af Amer: >90 mL/min (ref >90)
Glucose, Bld: 122 mg/dL — ABNORMAL HIGH (ref 70–99)
Glucose, Bld: 189 mg/dL — ABNORMAL HIGH (ref 70–99)
Sodium: 135 mEq/L (ref 135–145)

## 2011-01-19 LAB — CARDIAC PANEL(CRET KIN+CKTOT+MB+TROPI)
CK, MB: 2.8 ng/mL (ref 0.3–4.0)
Total CK: 57 U/L (ref 7–232)
Troponin I: <0.3 ng/mL (ref <0.30)

## 2011-01-19 LAB — LIPID PANEL
LDL Cholesterol: 57 mg/dL (ref 0–99)
Triglycerides: 96 mg/dL (ref <150)

## 2011-01-19 LAB — CBC
HCT: 41.3 % (ref 39.0–52.0)
Hemoglobin: 13.6 g/dL (ref 13.0–17.0)
MCH: 32.3 pg (ref 26.0–34.0)
MCHC: 32.9 g/dL (ref 30.0–36.0)
MCV: 98.1 fL (ref 78.0–100.0)
RBC: 4.21 MIL/uL — ABNORMAL LOW (ref 4.22–5.81)

## 2011-01-19 LAB — GLUCOSE, CAPILLARY: Glucose-Capillary: 152 mg/dL — ABNORMAL HIGH (ref 70–99)

## 2011-01-19 MED ORDER — METOPROLOL TARTRATE 25 MG PO TABS
25.0000 mg | ORAL_TABLET | Freq: Four times a day (QID) | ORAL | Status: DC
  Administered 2011-01-19 – 2011-01-22 (×12): 25 mg via ORAL
  Filled 2011-01-19 (×19): qty 1

## 2011-01-19 MED ORDER — WARFARIN SODIUM 7.5 MG PO TABS
15.0000 mg | ORAL_TABLET | Freq: Once | ORAL | Status: AC
  Administered 2011-01-19: 15 mg via ORAL
  Filled 2011-01-19: qty 2

## 2011-01-19 NOTE — Progress Notes (Signed)
  Echocardiogram 2D Echocardiogram has been performed.  Cathie Beams Deneen 01/19/2011, 1:57 PM

## 2011-01-19 NOTE — Progress Notes (Signed)
Heparin level is 0.36 at current rate of 2600 units/hr with no reported complications or bleeding. Will f/u daily levels and continue at current rate.

## 2011-01-19 NOTE — Progress Notes (Addendum)
ANTICOAGULATION CONSULT NOTE - Follow Up Consult  Pharmacy Consult for heparin and Coumadin Indication: atrial fibrillation  No Known Allergies  Patient Measurements: Height: 5\' 10"  (177.8 cm) Weight: 298 lb 8.1 oz (135.4 kg) IBW/kg (Calculated) : 73  Adjusted Body Weight: 106.4  Vital Signs: Temp: 98.2 F (36.8 C) (11/25 0619) Temp src: Oral (11/25 0619) BP: 130/71 mmHg (11/25 0619) Pulse Rate: 136  (11/25 0619)  Labs:  Basename 01/19/11 0700 01/19/11 0113 01/18/11 2351 01/18/11 1535 01/18/11 0700 01/18/11 0207 01/17/11 1828 01/17/11 1247  HGB 13.6 -- -- -- -- -- -- 14.2  HCT 41.3 -- -- -- -- -- -- 42.4  PLT 226 -- -- -- -- -- -- 266  APTT -- -- -- -- -- -- 30 --  LABPROT 15.0 -- -- -- 15.0 15.0 -- --  INR 1.16 -- -- -- 1.16 1.16 -- --  HEPARINUNFRC 0.35 0.36 -- 0.11* -- -- -- --  CREATININE 0.78 -- -- -- 0.77 -- -- 0.68  CKTOTAL -- -- 57 62 64 -- -- --  CKMB -- -- 2.8 3.3 3.6 -- -- --  TROPONINI -- -- <0.30 <0.30 <0.30 -- -- --   Estimated Creatinine Clearance: 149.7 ml/min (by C-G formula based on Cr of 0.78).   Medications:  Scheduled:     . aspirin EC  81 mg Oral Daily  . digoxin  0.25 mg Intravenous Daily  . digoxin  0.25 mg Oral Daily  . diltiazem (CARDIZEM) infusion  5-15 mg/hr Intravenous To Major  . folic acid  1 mg Oral Daily  . furosemide  40 mg Intravenous BID  . heparin  4,000 Units Intravenous Once  . LORazepam  0-4 mg Oral Q6H   Followed by  . LORazepam  0-4 mg Oral Q12H  . metoprolol tartrate  25 mg Oral QID  . multivitamins ther. w/minerals  1 tablet Oral Daily  . sodium chloride  3 mL Intravenous Q12H  . thiamine  100 mg Oral Daily   Or  . thiamine  100 mg Intravenous Daily  . warfarin  10 mg Oral ONCE-1800  . warfarin   Does not apply Once  . DISCONTD: metoprolol  5 mg Intravenous Q6H   Infusions:     . sodium chloride    . heparin 26 mL/hr (01/19/11 0640)  . DISCONTD: diltiazem (CARDIZEM) infusion 5 mg/hr (01/18/11 1327)  .  DISCONTD: heparin 2,200 Units/hr (01/18/11 0900)   Assessment: 50 yo male with atrial fibrillation on heparin and Coumadin. HL is at-goal, but INR has not increased despite 2 days of Coumadin 10mg  daily.   Goal of Therapy:  Heparin level 0.3-0.7 units/ml INR 2-3   Plan:  1. Continue heparin IV infusion at 2600 units/hr.  2. Coumadin 15mg  po today.  3. F/u AM INR, HL  Lorre Munroe, PharmD 01/19/2011, 9:09 AM

## 2011-01-19 NOTE — Progress Notes (Signed)
Subjective: Patient feels OK.  NO CP.  BReathing some better. Objective: Filed Vitals:   01/18/11 1545 01/18/11 2229 01/19/11 0100 01/19/11 0619  BP: 118/62 123/85 119/71 130/71  Pulse: 81 95 109 136  Temp: 97.2 F (36.2 C) 97.7 F (36.5 C) 98 F (36.7 C) 98.2 F (36.8 C)  TempSrc: Oral Oral Oral Oral  Resp: 18 20 20 20   Height:      Weight:    298 lb 8.1 oz (135.4 kg)  SpO2: 96% 92% 92% 93%   Weight change: -11 lb 8 oz (-5.215 kg)  Intake/Output Summary (Last 24 hours) at 01/19/11 0823 Last data filed at 01/19/11 0640  Gross per 24 hour  Intake 1366.96 ml  Output   3550 ml  Net -2183.04 ml    General: Alert, awake, oriented x3, in no acute distress.  HEENT: No bruits, no goiter.  Heart: Irregular rate and rhythm, without murmurs, rubs, gallops.  Lungs: Rel clear. bilateral air movement.  Abdomen: Soft, nontender, nondistended, positive bowel sounds.  Neuro: Grossly intact, nonfocal. Ext:  1-2+edema   Lab Results: Results for orders placed during the hospital encounter of 01/17/11 (from the past 24 hour(s))  HEPARIN LEVEL (UNFRACTIONATED)     Status: Abnormal   Collection Time   01/18/11  9:00 AM      Component Value Range   Heparin Unfractionated <0.10 (*) 0.30 - 0.70 (IU/mL)  CARDIAC PANEL(CRET KIN+CKTOT+MB+TROPI)     Status: Normal   Collection Time   01/18/11  3:35 PM      Component Value Range   Total CK 62  7 - 232 (U/L)   CK, MB 3.3  0.3 - 4.0 (ng/mL)   Troponin I <0.30  <0.30 (ng/mL)   Relative Index RELATIVE INDEX IS INVALID  0.0 - 2.5   HEPARIN LEVEL (UNFRACTIONATED)     Status: Abnormal   Collection Time   01/18/11  3:35 PM      Component Value Range   Heparin Unfractionated 0.11 (*) 0.30 - 0.70 (IU/mL)  CARDIAC PANEL(CRET KIN+CKTOT+MB+TROPI)     Status: Normal   Collection Time   01/18/11 11:51 PM      Component Value Range   Total CK 57  7 - 232 (U/L)   CK, MB 2.8  0.3 - 4.0 (ng/mL)   Troponin I <0.30  <0.30 (ng/mL)   Relative Index  RELATIVE INDEX IS INVALID  0.0 - 2.5   LIPID PANEL     Status: Abnormal   Collection Time   01/18/11 11:51 PM      Component Value Range   Cholesterol 110  0 - 200 (mg/dL)   Triglycerides 96  <865 (mg/dL)   HDL 34 (*) >78 (mg/dL)   Total CHOL/HDL Ratio 3.2     VLDL 19  0 - 40 (mg/dL)   LDL Cholesterol 57  0 - 99 (mg/dL)  HEPARIN LEVEL (UNFRACTIONATED)     Status: Normal   Collection Time   01/19/11  1:13 AM      Component Value Range   Heparin Unfractionated 0.36  0.30 - 0.70 (IU/mL)  BASIC METABOLIC PANEL     Status: Abnormal   Collection Time   01/19/11  7:00 AM      Component Value Range   Sodium 135  135 - 145 (mEq/L)   Potassium 3.9  3.5 - 5.1 (mEq/L)   Chloride 96  96 - 112 (mEq/L)   CO2 27  19 - 32 (mEq/L)   Glucose, Bld 122 (*)  70 - 99 (mg/dL)   BUN 12  6 - 23 (mg/dL)   Creatinine, Ser 5.28  0.50 - 1.35 (mg/dL)   Calcium 9.3  8.4 - 41.3 (mg/dL)   GFR calc non Af Amer >90  >90 (mL/min)   GFR calc Af Amer >90  >90 (mL/min)  PROTIME-INR     Status: Normal   Collection Time   01/19/11  7:00 AM      Component Value Range   Prothrombin Time 15.0  11.6 - 15.2 (seconds)   INR 1.16  0.00 - 1.49   HEPARIN LEVEL (UNFRACTIONATED)     Status: Normal   Collection Time   01/19/11  7:00 AM      Component Value Range   Heparin Unfractionated 0.35  0.30 - 0.70 (IU/mL)  CBC     Status: Abnormal   Collection Time   01/19/11  7:00 AM      Component Value Range   WBC 7.5  4.0 - 10.5 (K/uL)   RBC 4.21 (*) 4.22 - 5.81 (MIL/uL)   Hemoglobin 13.6  13.0 - 17.0 (g/dL)   HCT 24.4  01.0 - 27.2 (%)   MCV 98.1  78.0 - 100.0 (fL)   MCH 32.3  26.0 - 34.0 (pg)   MCHC 32.9  30.0 - 36.0 (g/dL)   RDW 53.6  64.4 - 03.4 (%)   Platelets 226  150 - 400 (K/uL)    Studies/Results: Dg Chest 2 View  01/17/2011  *RADIOLOGY REPORT*  Clinical Data: shortness of breath.  Cough and smoker.  CHEST - 2 VIEW  Comparison: None  Findings:  The heart size appears normal.  There is a moderate-sized right  pleural effusion.  There is associated decreased aeration to the right lower lobe and right middle lobe.  Left lung is clear.  IMPRESSION:  1.  Right pleural effusion with decreased aeration to the right lung base.  Original Report Authenticated By: Rosealee Albee, M.D.    Medications: I have reviewed the patient's current medications.  Active Problems:  Atrial fibrillation  Rates are not controlled now.  Will add Lopressor PO on top of IV dose.  If rates do not come down then possible TEE cardioversion tomorro.  CHF (congestive heart failure)  Volume status is improving.  COntinue IV lasxi  Watch K.  Formal echo pending.  DM (diabetes mellitus)  New dx.  Dietary to see.  Would like to add carb modified to diet.  Check CS>  Tobacco use disorder  COunselled.   Patient Active Hospital Problem List: No active hospital problems.   LOS: 2 days   Dietrich Pates 01/19/2011, 8:23 AM

## 2011-01-20 ENCOUNTER — Encounter (HOSPITAL_COMMUNITY): Payer: Self-pay | Admitting: *Deleted

## 2011-01-20 ENCOUNTER — Encounter (HOSPITAL_COMMUNITY): Payer: Self-pay | Admitting: Certified Registered"

## 2011-01-20 ENCOUNTER — Inpatient Hospital Stay (HOSPITAL_COMMUNITY): Payer: BC Managed Care – PPO | Admitting: Certified Registered"

## 2011-01-20 ENCOUNTER — Encounter (HOSPITAL_COMMUNITY)
Admission: EM | Disposition: A | Discharge status: Home / Self Care | Payer: Self-pay | Source: Home / Self Care | Attending: Internal Medicine

## 2011-01-20 HISTORY — PX: TEE WITHOUT CARDIOVERSION: SHX5443

## 2011-01-20 HISTORY — PX: CARDIOVERSION: SHX1299

## 2011-01-20 LAB — PROTIME-INR
INR: 1.34 (ref 0.00–1.49)
Prothrombin Time: 16.8 seconds — ABNORMAL HIGH (ref 11.6–15.2)

## 2011-01-20 LAB — BASIC METABOLIC PANEL
CO2: 28 mEq/L (ref 19–32)
CO2: 31 mEq/L (ref 19–32)
Calcium: 9.6 mg/dL (ref 8.4–10.5)
Calcium: 9.5 mg/dL (ref 8.4–10.5)
Chloride: 98 mEq/L (ref 96–112)
Creatinine, Ser: 0.84 mg/dL (ref 0.50–1.35)
Creatinine, Ser: 0.84 mg/dL (ref 0.50–1.35)
GFR calc non Af Amer: >90 mL/min (ref >90)
Glucose, Bld: 126 mg/dL — ABNORMAL HIGH (ref 70–99)
Sodium: 138 mEq/L (ref 135–145)

## 2011-01-20 LAB — GLUCOSE, CAPILLARY

## 2011-01-20 SURGERY — ECHOCARDIOGRAM, TRANSESOPHAGEAL
Anesthesia: Moderate Sedation

## 2011-01-20 SURGERY — CARDIOVERSION
Anesthesia: Monitor Anesthesia Care | Wound class: Clean

## 2011-01-20 MED ORDER — SODIUM CHLORIDE 0.9 % IJ SOLN: 3.0000 mL | INTRAMUSCULAR | Status: DC | PRN

## 2011-01-20 MED ORDER — MIDAZOLAM HCL 10 MG/2ML IJ SOLN: 10.0000 mg | Freq: Once | INTRAMUSCULAR | Status: DC

## 2011-01-20 MED ORDER — WARFARIN SODIUM 7.5 MG PO TABS
15.0000 mg | ORAL_TABLET | Freq: Once | ORAL | Status: AC
  Administered 2011-01-20: 15 mg via ORAL
  Filled 2011-01-20: qty 2

## 2011-01-20 MED ORDER — FENTANYL CITRATE 0.05 MG/ML IJ SOLN
INTRAMUSCULAR | Status: DC | PRN
  Administered 2011-01-20 (×2): 25 ug via INTRAVENOUS

## 2011-01-20 MED ORDER — FENTANYL CITRATE 0.05 MG/ML IJ SOLN: 250.0000 ug | Freq: Once | INTRAMUSCULAR | Status: DC

## 2011-01-20 MED ORDER — FENTANYL CITRATE 0.05 MG/ML IJ SOLN
INTRAMUSCULAR | Status: AC
  Filled 2011-01-20: qty 2

## 2011-01-20 MED ORDER — SODIUM CHLORIDE 0.9 % IV SOLN
250.0000 mL | INTRAVENOUS | Status: DC
  Administered 2011-01-20: 250 mL via INTRAVENOUS

## 2011-01-20 MED ORDER — PROPOFOL 10 MG/ML IV EMUL
INTRAVENOUS | Status: DC | PRN
  Administered 2011-01-20: 12 mL via INTRAVENOUS

## 2011-01-20 MED ORDER — LIDOCAINE VISCOUS 2 % MT SOLN
OROMUCOSAL | Status: DC | PRN
  Administered 2011-01-20: 1 via OROMUCOSAL

## 2011-01-20 MED ORDER — BENZOCAINE 20 % MT SOLN
1.0000 "application " | OROMUCOSAL | Status: DC | PRN
  Filled 2011-01-20: qty 57

## 2011-01-20 MED ORDER — MIDAZOLAM HCL 10 MG/2ML IJ SOLN
INTRAMUSCULAR | Status: DC | PRN
  Administered 2011-01-20 (×2): 2 mg via INTRAVENOUS

## 2011-01-20 MED ORDER — SODIUM CHLORIDE 0.9 % IJ SOLN
3.0000 mL | Freq: Two times a day (BID) | INTRAMUSCULAR | Status: DC
  Administered 2011-01-21: 3 mL via INTRAVENOUS

## 2011-01-20 MED ORDER — LIDOCAINE VISCOUS 2 % MT SOLN
OROMUCOSAL | Status: AC
  Filled 2011-01-20: qty 15

## 2011-01-20 MED ORDER — SODIUM CHLORIDE 0.45 % IV SOLN: INTRAVENOUS | Status: DC

## 2011-01-20 MED ORDER — MIDAZOLAM HCL 10 MG/2ML IJ SOLN
INTRAMUSCULAR | Status: AC
  Filled 2011-01-20: qty 2

## 2011-01-20 NOTE — Transfer of Care (Signed)
Immediate Anesthesia Transfer of Care Note  Patient: Bryan Rice  Procedure(s) Performed:  TRANSESOPHAGEAL ECHOCARDIOGRAM (TEE); CARDIOVERSION  Patient Location: PACU and Endoscopy Unit  Anesthesia Type: General  Level of Consciousness: awake, alert  and oriented  Airway & Oxygen Therapy: Patient Spontanous Breathing and Patient connected to nasal cannula oxygen  Post-op Assessment: Report given to PACU RN, Post -op Vital signs reviewed and stable and Patient moving all extremities  Post vital signs: Reviewed and stable  Complications: No apparent anesthesia complications

## 2011-01-20 NOTE — Progress Notes (Signed)
Nutrition Note  RD consult received 01/19/11 for DM and CHF education. RD assessed pt per consult new onset CHF 01/18/11. RD educated pt on heart healthy and DM diet. Followed up with pt no additional questions or concerns at this time. Education needs satisfied.   Pager [phone]

## 2011-01-20 NOTE — Anesthesia Preprocedure Evaluation (Signed)
Anesthesia Evaluation  Patient identified by MRN, date of birth, ID band Patient awake    Reviewed: Allergy & Precautions, H&P , NPO status , Patient's Chart, lab work & pertinent test results  Airway Mallampati: II TM Distance: >3 FB Neck ROM: full    Dental  (+) Teeth Intact   Pulmonary shortness of breath,          Cardiovascular + dysrhythmias Atrial Fibrillation     Neuro/Psych    GI/Hepatic   Endo/Other  Diabetes mellitus-, Well Controlled, Type 2  Renal/GU      Musculoskeletal   Abdominal   Peds  Hematology   Anesthesia Other Findings   Reproductive/Obstetrics                           Anesthesia Physical Anesthesia Plan  ASA: III  Anesthesia Plan: General   Post-op Pain Management:    Induction:   Airway Management Planned:   Additional Equipment:   Intra-op Plan:   Post-operative Plan:   Informed Consent: I have reviewed the patients History and Physical, chart, labs and discussed the procedure including the risks, benefits and alternatives for the proposed anesthesia with the patient or authorized representative who has indicated his/her understanding and acceptance.   Dental Advisory Given  Plan Discussed with: Anesthesiologist and Surgeon  Anesthesia Plan Comments:         Anesthesia Quick Evaluation

## 2011-01-20 NOTE — Brief Op Note (Signed)
01/17/2011 - 01/20/2011  2:56 PM  PATIENT:  Bryan Rice  50 y.o. male  PRE-OPERATIVE DIAGNOSIS:  Atrial fibrillation  POST-OPERATIVE DIAGNOSIS:  * No post-op diagnosis entered *  PROCEDURE:  Procedure(s): TRANSESOPHAGEAL ECHOCARDIOGRAM (TEE) CARDIOVERSION  SURGEON:  Surgeon(s): Pricilla Riffle, MD  Full report to follow.

## 2011-01-20 NOTE — Procedures (Signed)
Patient sedated per anesthesia with 120 mg propofol IV.  With pads in AP position patient cardioverted to SR with 200J synchronized biphasic energy.  No complication.  EKG pending.

## 2011-01-20 NOTE — Progress Notes (Signed)
Subjective: Not SOB at rest.  Never has been. Objective: Filed Vitals:   01/19/11 1026 01/19/11 1444 01/19/11 2056 01/20/11 0609  BP:  124/76 138/76 94/68  Pulse: 142 123 98 116  Temp:  98.1 F (36.7 C) 97.8 F (36.6 C) 97.6 F (36.4 C)  TempSrc:  Oral    Resp:  18 18 18   Height:      Weight:    292 lb 6.4 oz (132.632 kg)  SpO2:  91% 94% 94%   Weight change: -6 lb 1.7 oz (-2.768 kg)  Intake/Output Summary (Last 24 hours) at 01/20/11 0808 Last data filed at 01/20/11 0749  Gross per 24 hour  Intake 1302.27 ml  Output   3000 ml  Net -1697.73 ml    General: Alert, awake, oriented x3, in no acute distress.  HEENT: No bruits, no goiter.  Heart: Irregular rate and rhythm, without murmurs, rubs, gallops.  Lungs: Crackles left side, bilateral air movement.  Abdomen: Soft, nontender, nondistended, positive bowel sounds.  Ext:  1+ edema. Neuro: Grossly intact, nonfocal.   Lab Results: Results for orders placed during the hospital encounter of 01/17/11 (from the past 24 hour(s))  BASIC METABOLIC PANEL     Status: Abnormal   Collection Time   01/19/11  8:50 AM      Component Value Range   Sodium 134 (*) 135 - 145 (mEq/L)   Potassium 3.8  3.5 - 5.1 (mEq/L)   Chloride 95 (*) 96 - 112 (mEq/L)   CO2 28  19 - 32 (mEq/L)   Glucose, Bld 189 (*) 70 - 99 (mg/dL)   BUN 11  6 - 23 (mg/dL)   Creatinine, Ser 1.61  0.50 - 1.35 (mg/dL)   Calcium 9.2  8.4 - 09.6 (mg/dL)   GFR calc non Af Amer >90  >90 (mL/min)   GFR calc Af Amer >90  >90 (mL/min)  GLUCOSE, CAPILLARY     Status: Abnormal   Collection Time   01/19/11  8:59 PM      Component Value Range   Glucose-Capillary 152 (*) 70 - 99 (mg/dL)   Comment 1 Notify RN     Comment 2 Documented in Chart    PRO B NATRIURETIC PEPTIDE     Status: Abnormal   Collection Time   01/20/11  5:55 AM      Component Value Range   BNP, POC 1815.0 (*) 0 - 125 (pg/mL)  BASIC METABOLIC PANEL     Status: Abnormal   Collection Time   01/20/11  5:55 AM        Component Value Range   Sodium 136  135 - 145 (mEq/L)   Potassium 3.8  3.5 - 5.1 (mEq/L)   Chloride 98  96 - 112 (mEq/L)   CO2 28  19 - 32 (mEq/L)   Glucose, Bld 126 (*) 70 - 99 (mg/dL)   BUN 14  6 - 23 (mg/dL)   Creatinine, Ser 0.45  0.50 - 1.35 (mg/dL)   Calcium 9.6  8.4 - 40.9 (mg/dL)   GFR calc non Af Amer >90  >90 (mL/min)   GFR calc Af Amer >90  >90 (mL/min)  PROTIME-INR     Status: Abnormal   Collection Time   01/20/11  5:55 AM      Component Value Range   Prothrombin Time 16.8 (*) 11.6 - 15.2 (seconds)   INR 1.34  0.00 - 1.49   HEPARIN LEVEL (UNFRACTIONATED)     Status: Normal   Collection Time  01/20/11  5:55 AM      Component Value Range   Heparin Unfractionated 0.32  0.30 - 0.70 (IU/mL)  GLUCOSE, CAPILLARY     Status: Abnormal   Collection Time   01/20/11  6:25 AM      Component Value Range   Glucose-Capillary 128 (*) 70 - 99 (mg/dL)   Comment 1 Notify RN      Studies/Results: No results found.  Medications: I have reviewed the patient's current medications.   Patient Active Hospital Problem List: Atrial fibrillation (01/19/2011)   Assessment: Rates still high, greater than 100 most of time.  Up to 160.     Plan: TEE with possible cardioversion.  Continue meds CHF (congestive heart failure) (01/19/2011)   Assessment: IMproving.  Still increased volume on exam.   Plan: Contineu to follow U.O.  Not done yesterday. DM (diabetes mellitus) (01/19/2011)   Assessment:    Plan: Tobacco use disorder (01/19/2011)   Assessment:   Plan: COunselled.   LOS: 3 days   Bryan Rice 01/20/2011, 8:08 AM

## 2011-01-20 NOTE — Progress Notes (Signed)
UR Completed. Bryan Rice  01/20/2011 [phone]   

## 2011-01-20 NOTE — Progress Notes (Signed)
ANTICOAGULATION CONSULT NOTE - Follow Up Consult  Pharmacy Consult for heparin and Coumadin Indication: atrial fibrillation  No Known Allergies  Patient Measurements: Height: 5\' 10"  (177.8 cm) Weight: 292 lb 6.4 oz (132.632 kg) (scale C) IBW/kg (Calculated) : 73  Adjusted Body Weight: 106.4  Vital Signs: Temp: 97.6 F (36.4 C) (11/26 0609) BP: 94/68 mmHg (11/26 0609) Pulse Rate: 116  (11/26 0609)  Labs:  Basename 01/20/11 0555 01/19/11 0850 01/19/11 0700 01/19/11 0113 01/18/11 2351 01/18/11 1535 01/18/11 0700 01/17/11 1828 01/17/11 1247  HGB -- -- 13.6 -- -- -- -- -- 14.2  HCT -- -- 41.3 -- -- -- -- -- 42.4  PLT -- -- 226 -- -- -- -- -- 266  APTT -- -- -- -- -- -- -- 30 --  LABPROT 16.8* -- 15.0 -- -- -- 15.0 -- --  INR 1.34 -- 1.16 -- -- -- 1.16 -- --  HEPARINUNFRC 0.32 -- 0.35 0.36 -- -- -- -- --  CREATININE 0.84 0.76 0.78 -- -- -- -- -- --  CKTOTAL -- -- -- -- 57 62 64 -- --  CKMB -- -- -- -- 2.8 3.3 3.6 -- --  TROPONINI -- -- -- -- <0.30 <0.30 <0.30 -- --   Estimated Creatinine Clearance: 140.8 ml/min (by C-G formula based on Cr of 0.84).   Medications:  Scheduled:     . aspirin EC  81 mg Oral Daily  . digoxin  0.25 mg Oral Daily  . folic acid  1 mg Oral Daily  . furosemide  40 mg Intravenous BID  . LORazepam  0-4 mg Oral Q6H   Followed by  . LORazepam  0-4 mg Oral Q12H  . metoprolol tartrate  25 mg Oral QID  . multivitamins ther. w/minerals  1 tablet Oral Daily  . sodium chloride  3 mL Intravenous Q12H  . sodium chloride  3 mL Intravenous Q12H  . thiamine  100 mg Oral Daily   Or  . thiamine  100 mg Intravenous Daily  . warfarin  15 mg Oral ONCE-1800   Infusions:     . sodium chloride    . sodium chloride    . heparin 26 mL/hr (01/20/11 0636)   Assessment: 50 yo male with atrial fibrillation on heparin and Coumadin. HL is at-goal.  INR has increased slightly.   Goal of Therapy:  Heparin level 0.3-0.7 units/ml INR 2-3   Plan:  1. Continue  heparin IV infusion at 2600 units/hr.  2. Coumadin 15mg  po today.  3. F/u AM INR, HL  Suriyah Vergara,Pharm D 01/20/2011, 9:26 AM

## 2011-01-20 NOTE — Progress Notes (Signed)
Attempted to see patient for d/c needs. Patient is off the unit for cardioversion. CM will f/u tomorrow. Donn Pierini Sadrac Zeoli, rn,msn

## 2011-01-20 NOTE — Anesthesia Postprocedure Evaluation (Signed)
  Anesthesia Post-op Note  Patient: Bryan Rice  Procedure(s) Performed:  TRANSESOPHAGEAL ECHOCARDIOGRAM (TEE); CARDIOVERSION  Patient Location: PACU and Endoscopy Unit  Anesthesia Type: General  Level of Consciousness: awake, alert  and oriented  Airway and Oxygen Therapy: Patient Spontanous Breathing and Patient connected to nasal cannula oxygen  Post-op Pain: none  Post-op Assessment: Post-op Vital signs reviewed, Patient's Cardiovascular Status Stable, Respiratory Function Stable, Patent Airway and No signs of Nausea or vomiting  Post-op Vital Signs: Reviewed and stable  Complications: No apparent anesthesia complications

## 2011-01-21 ENCOUNTER — Other Ambulatory Visit: Payer: Self-pay

## 2011-01-21 ENCOUNTER — Encounter (HOSPITAL_COMMUNITY): Payer: Self-pay

## 2011-01-21 ENCOUNTER — Encounter (HOSPITAL_COMMUNITY): Payer: Self-pay | Admitting: Internal Medicine

## 2011-01-21 LAB — PRO B NATRIURETIC PEPTIDE: Pro B Natriuretic peptide (BNP): 1065 pg/mL — ABNORMAL HIGH (ref 0–125)

## 2011-01-21 LAB — HEPARIN LEVEL (UNFRACTIONATED): Heparin Unfractionated: 0.4 IU/mL (ref 0.30–0.70)

## 2011-01-21 LAB — PROTIME-INR
INR: 1.92 — ABNORMAL HIGH (ref 0.00–1.49)
Prothrombin Time: 22.3 seconds — ABNORMAL HIGH (ref 11.6–15.2)

## 2011-01-21 LAB — GLUCOSE, CAPILLARY
Glucose-Capillary: 177 mg/dL — ABNORMAL HIGH (ref 70–99)
Glucose-Capillary: 163 mg/dL — ABNORMAL HIGH (ref 70–99)

## 2011-01-21 MED ORDER — LISINOPRIL 5 MG PO TABS
5.0000 mg | ORAL_TABLET | Freq: Every day | ORAL | Status: DC
  Administered 2011-01-21 – 2011-01-22 (×2): 5 mg via ORAL
  Filled 2011-01-21 (×2): qty 1

## 2011-01-21 MED ORDER — WARFARIN SODIUM 2.5 MG PO TABS
12.5000 mg | ORAL_TABLET | Freq: Once | ORAL | Status: AC
  Administered 2011-01-21: 12.5 mg via ORAL
  Filled 2011-01-21: qty 1

## 2011-01-21 NOTE — Progress Notes (Signed)
Discussed discharge planning. Lives alone and still works. Patient has living better with HF booklet. Patient does not want HH services at this time. No other d/c needs identified. Gustabo Gordillo, rn,msn

## 2011-01-21 NOTE — Progress Notes (Signed)
ANTICOAGULATION CONSULT NOTE - Follow Up Consult  Pharmacy Consult for Heparin-->Coumadin Indication: atrial fibrillation  No Known Allergies  Patient Measurements: Height: 5\' 10"  (177.8 cm) Weight: 292 lb 6.4 oz (132.632 kg) (scale C) IBW/kg (Calculated) : 73  Heparin dosing weight 103.7kg  Vital Signs: Temp: 97.1 F (36.2 C) (11/27 0607) Temp src: Oral (11/27 0607) BP: 127/74 mmHg (11/27 0607) Pulse Rate: 87  (11/27 0607)  Labs:  Basename 01/21/11 0600 01/20/11 0922 01/20/11 0555 01/19/11 0850 01/19/11 0700 01/18/11 2351 01/18/11 1535  HGB -- -- -- -- 13.6 -- --  HCT -- -- -- -- 41.3 -- --  PLT -- -- -- -- 226 -- --  APTT -- -- -- -- -- -- --  LABPROT 22.3* -- 16.8* -- 15.0 -- --  INR 1.92* -- 1.34 -- 1.16 -- --  HEPARINUNFRC 0.10* -- 0.32 -- 0.35 -- --  CREATININE -- 0.84 0.84 0.76 -- -- --  CKTOTAL -- -- -- -- -- 57 62  CKMB -- -- -- -- -- 2.8 3.3  TROPONINI -- -- -- -- -- <0.30 <0.30   Estimated Creatinine Clearance: 140.8 ml/min (by C-G formula based on Cr of 0.84).   Medications:  Heparin at 2600 units/hr  Assessment: 50yo morbidly obese male now s/p cardioversion continues on heparin and coumadin for afib. Heparin level of 0.10 has dropped and is now below goal. Patient reports that infusion was leaking around 0500 and he may have not been getting the heparin for ~ 1 hour. INR of 1.92 below goal as well but finally increasing after 2 x 15mg  doses.  Goal of Therapy:  Heparin level 0.3-0.7 INR 2-3   Plan:  1) Increase heparin drip to 2800 units/hr 2) 6h heparin level 3) Coumadin 12.5mg  x 1  Fredrik Rigger 01/21/2011,8:25 AM

## 2011-01-21 NOTE — Progress Notes (Signed)
MD notified regarding CBG checks without current orders for coverage.   Nino Glow RN

## 2011-01-21 NOTE — Progress Notes (Signed)
Patient Name: Bryan Rice Date of Encounter: 01/21/2011  Subjective: Feels as though breathing is better. Hasn't really ambulated yet. LEE is slightly better. Vigorous UOP. No CP/palps.  Objective: Telemetry: NSR since cardioversion. Rate 90.  Physical Exam: BP 127/74  Pulse 87  Temp(Src) 97.1 F (36.2 C) (Oral)  Resp 18  Ht 5\' 10"  (1.778 m)  Wt 132.632 kg (292 lb 6.4 oz)  BMI 41.96 kg/m2  SpO2 93%  General: Overweight WM, well developed in no acute distress.  Head: Normocephalic, atraumatic, sclera are mildly icteric. No xanthomas, nares are without discharge.  Neck: Negative for carotid bruits. JVD is elevated.  Lungs: Clear Heart: Regular with normal S1 S2. No murmurs, rubs, or gallops appreciated.  Abdomen: Obese, soft, non-tender, non-distended with normoactive bowel sounds. No hepatomegaly. No rebound/guarding. No obvious abdominal masses.  Msk: Strength and tone appear normal for age.  Extremities: No clubbing or cyanosis. 2+ LEE with some erythematous stasis changes on LEs. Distal pedal pulses are 1+ and equal bilaterally.  Neuro: Alert and oriented X 3. Moves all extremities spontaneously.  Psych: Responds to questions appropriately with a normal affect.    Intake/Output Summary (Last 24 hours) at 01/21/11 0845 Last data filed at 01/21/11 1610  Gross per 24 hour  Intake 969.15 ml  Output   2400 ml  Net -1430.85 ml    Labs:  [facility] 01/20/11 0922 01/20/11 0555  NA 138 136  K 3.7 3.8  CL 98 98  CO2 31 28  GLUCOSE 128* 126*  BUN 14 14  CREATININE 0.84 0.84  CALCIUM 9.5 9.6  MG -- --  PHOS -- --   No results found for this basename: AST:2,ALT:2,ALKPHOS:2,BILITOT:2,PROT:2,ALBUMIN:2 in the last 72 hours   Basename 01/19/11 0700  WBC 7.5  NEUTROABS --  HGB 13.6  HCT 41.3  MCV 98.1  PLT 226    Basename 01/18/11 2351 01/18/11 1535  CKTOTAL 57 62  CKMB 2.8 3.3  TROPONINI <0.30 <0.30    Basename 01/20/11 2357 01/20/11 0555  POCBNP 1065.0*  1815.0*    No results found for this basename: HGBA1C in the last 72 hours -- pending No results found for this basename: CHOL,HDL,LDLCALC,TRIG,CHOLHDL in the last 72 hours - pending No results found for this basename: TSH,T4TOTAL,FREET3,T3FREE,THYROIDAB in the last 72 hours     Assessment and Plan: 1. New onset atrial fibrillation with RVR, duration unknown. Now status post DCCV, maintaining NSR for now.  2. Acute systolic CHF - EF 25-30%. Still edematous. Continue IV Lasix another day. On metoprolol. Will add low dose ACEi. Need to watch BP carefully. Increase activity today.  3. DM  4. EtOH abuse - on CIWA protocol. Patient was counseled on cutting down.    5. Tobacco abuse - patient counseled regarding cessation. Offered nicotine patch but he declined.  6. Anticoagulation. INR is 1.9 today. Will continue IV heparin until coumadin is therapeutic. Signed, Marda Breidenbach [place] MD

## 2011-01-21 NOTE — Progress Notes (Signed)
Pharmacy: Heparin (Afib) Follow-up heparin level of 0.40 is therapeutic on 2800 unit/hr. Continue heparin at 2800 units/hr and follow up AM heparin level.

## 2011-01-22 ENCOUNTER — Telehealth: Payer: Self-pay | Admitting: Physician Assistant

## 2011-01-22 DIAGNOSIS — I429 Cardiomyopathy, unspecified: Secondary | ICD-10-CM

## 2011-01-22 DIAGNOSIS — F1010 Alcohol abuse, uncomplicated: Secondary | ICD-10-CM

## 2011-01-22 LAB — BASIC METABOLIC PANEL
BUN: 16 mg/dL (ref 6–23)
CO2: 29 mEq/L (ref 19–32)
Chloride: 96 mEq/L (ref 96–112)
Creatinine, Ser: 0.84 mg/dL (ref 0.50–1.35)

## 2011-01-22 MED ORDER — METOPROLOL TARTRATE 50 MG PO TABS: 50.0000 mg | ORAL_TABLET | Freq: Two times a day (BID) | ORAL | Status: DC

## 2011-01-22 MED ORDER — ASPIRIN 81 MG PO TBEC: 81.0000 mg | DELAYED_RELEASE_TABLET | Freq: Every day | ORAL | Status: DC

## 2011-01-22 MED ORDER — FUROSEMIDE 40 MG PO TABS
40.0000 mg | ORAL_TABLET | Freq: Two times a day (BID) | ORAL | Status: DC
  Administered 2011-01-22: 40 mg via ORAL
  Filled 2011-01-22 (×3): qty 1

## 2011-01-22 MED ORDER — LISINOPRIL 5 MG PO TABS: 5.0000 mg | ORAL_TABLET | Freq: Every day | ORAL | Status: DC

## 2011-01-22 MED ORDER — DIGOXIN 250 MCG PO TABS: 0.2500 mg | ORAL_TABLET | Freq: Every day | ORAL | Status: DC

## 2011-01-22 MED ORDER — WARFARIN SODIUM 10 MG PO TABS: 10.0000 mg | ORAL_TABLET | ORAL | Status: DC

## 2011-01-22 MED ORDER — WARFARIN SODIUM 2.5 MG PO TABS
12.5000 mg | ORAL_TABLET | Freq: Once | ORAL | Status: DC
  Filled 2011-01-22: qty 1

## 2011-01-22 MED ORDER — FUROSEMIDE 40 MG PO TABS: 40.0000 mg | ORAL_TABLET | Freq: Two times a day (BID) | ORAL | Status: DC

## 2011-01-22 NOTE — Telephone Encounter (Signed)
Received call from discharging nurse on 4700. Pt was discharged this morning and she did not give him the prescriptions that I left in his chart. (Procedure on 4700 per unit secretary has been to put the prescriptions in the patient's ghost chart, which was done.) The nurse was under the impression I called them in so discharged him without them. The patient is now at the pharmacy requesting his prescriptions, so I called into [facility] the same Rx's in the patient's discharge summary.

## 2011-01-22 NOTE — Progress Notes (Signed)
ANTICOAGULATION CONSULT NOTE - Follow Up Consult  Pharmacy Consult:  Coumadin Indication: atrial fibrillation  No Known Allergies  Patient Measurements: Height: 5\' 10"  (177.8 cm) Weight: 274 lb 11.1 oz (124.6 kg) IBW/kg (Calculated) : 73  Heparin dosing weight 103.7kg  Vital Signs: Temp: 97.8 F (36.6 C) (11/28 0619) Temp src: Oral (11/27 2049) BP: 102/62 mmHg (11/28 0619) Pulse Rate: 80  (11/28 0619)  Labs:  Basename 01/22/11 0549 01/21/11 1502 01/21/11 0600 01/20/11 0922 01/20/11 0555  HGB -- -- -- -- --  HCT -- -- -- -- --  PLT -- -- -- -- --  APTT -- -- -- -- --  LABPROT 23.3* -- 22.3* -- 16.8*  INR 2.03* -- 1.92* -- 1.34  HEPARINUNFRC 0.33 0.40 0.10* -- --  CREATININE 0.84 -- -- 0.84 0.84  CKTOTAL -- -- -- -- --  CKMB -- -- -- -- --  TROPONINI -- -- -- -- --   Estimated Creatinine Clearance: 136.2 ml/min (by C-G formula based on Cr of 0.84).    Assessment: 50yo morbidly obese male now s/p cardioversion and continues on Coumadin for Afib. Agree with d/c'ing heparin as INR therapeutic.  Noted plan to d/c patient today.  Goal of Therapy:  INR 2-3   Plan:  1.  Repeat Coumadin 12.5mg  PO today, give prior to d/c. 2.  Recommend d/c on Coumadin 10mg  PO daily with INR check by 01/24/11.   Laney Potash, Ashlee Bewley Dien 01/22/2011,8:15 AM

## 2011-01-22 NOTE — Progress Notes (Signed)
Patient Name: Bryan Rice Date of Encounter: 01/22/2011  Subjective: Ambulating in halls without difficulty. No SOB. Edema better. No complaints.   Objective: Telemetry: NSR since cardioversion. Rate 85.  Physical Exam: BP 102/62  Pulse 80  Temp(Src) 97.8 F (36.6 C) (Oral)  Resp 18  Ht 5\' 10"  (1.778 m)  Wt 124.6 kg (274 lb 11.1 oz)  BMI 39.41 kg/m2  SpO2 92%  General: Overweight WM, well developed in no acute distress.  Head: Normocephalic, atraumatic, sclera are mildly icteric. No xanthomas, nares are without discharge.  Neck: Negative for carotid bruits. JVD is elevated.  Lungs: Clear Heart: Regular with normal S1 S2. No murmurs, rubs, or gallops appreciated.  Abdomen: Obese, soft, non-tender, non-distended with normoactive bowel sounds. No hepatomegaly. No rebound/guarding. No obvious abdominal masses.  Msk: Strength and tone appear normal for age.  Extremities: No clubbing or cyanosis.1- 2+ LEE with some  stasis changes on LEs. Distal pedal pulses are 1+ and equal bilaterally.  Neuro: Alert and oriented X 3. Moves all extremities spontaneously.  Psych: Responds to questions appropriately with a normal affect.    Intake/Output Summary (Last 24 hours) at 01/22/11 0745 Last data filed at 01/22/11 0500  Gross per 24 hour  Intake 1752.6 ml  Output   3200 ml  Net -1447.4 ml    Labs:  Basename 01/22/11 0549 01/20/11 0922  NA 136 138  K 3.6 3.7  CL 96 98  CO2 29 31  GLUCOSE 124* 128*  BUN 16 14  CREATININE 0.84 0.84  CALCIUM 9.4 9.5  MG -- --  PHOS -- --   No results found for this basename: AST:2,ALT:2,ALKPHOS:2,BILITOT:2,PROT:2,ALBUMIN:2 in the last 72 hours  No results found for this basename: WBC:2,NEUTROABS:2,HGB:2,HCT:2,MCV:2,PLT:2 in the last 72 hours No results found for this basename: CKTOTAL:4,CKMB:4,TROPONINI:4 in the last 72 hours  Basename 01/20/11 2357 01/20/11 0555  POCBNP 1065.0* 1815.0*    No results found for this basename: HGBA1C in the  last 72 hours -- pending No results found for this basename: CHOL,HDL,LDLCALC,TRIG,CHOLHDL in the last 72 hours - pending No results found for this basename: TSH,T4TOTAL,FREET3,T3FREE,THYROIDAB in the last 72 hours     Assessment and Plan: 1. New onset atrial fibrillation with RVR, duration unknown. Now status post DCCV, maintaining NSR for now.  2. Acute systolic CHF - EF 25-30%. Edema improved.  On metoprolol and ACEi. Will change lasix to po. Stressed importance of sodium restriction. Will plan on discharge today with close follow up as outpatient with Dr. Tenny Craw.  3. DM  4. EtOH abuse - on CIWA protocol. Patient was counseled on stopping.  He is agreeable to do so.  5. Tobacco abuse - patient counseled regarding cessation. Offered nicotine patch but he declined.  6. Anticoagulation. INR is 2.0 today. Will discontinue IV heparin. Signed, Peter Swaziland MD

## 2011-01-22 NOTE — Discharge Summary (Signed)
Discharge Summary   Patient ID: Bryan Rice MRN: [id number], DOB/AGE: [birth year] 50 y.o. Admit date: 01/17/2011 D/C date:     01/22/2011   Primary Discharge Diagnoses:  1. Atrial fibrillation with RVR, newly diagnosed this admission - s/p TEE/DCCV 01/20/11 - started on Coumadin this admission for CHADS2 score of 2 2. Diabetes mellitus, newly diagnosed this admission - A1c 7.1 3. LV dysfunction with EF of 30%, newly diagnosed by echo 01/19/11 - EF 25-30% by TEE 01/20/11 - with acute systolic CHF requiring IV diuresis - for consideration of outpatient risk stratification 4. EtOH abuse - maintained on CIWA protocol without signs of full withdrawal - instructed to follow-up with PCP to follow 5. Tobacco abuse, counselled.  Hospital Course: Bryan Rice is a 50 y/o M with no prior cardiac history but hx of EtOH abuse & tobacco abuse who presented to the ER with a 3-week history of worsening dyspnea on exertion, especially over the last several days keeping him from doing his ADLs without becoming quite dyspneic. He also noted orthopnea, PND, & LEE. He made an appointment to establish care today with a PCP to be evaluated, and at that visit he was found to be in atrial fibrillation with RVR, HR in the 160's. He was subsequently transferred to [facility]. He received multiple doses of diltiazem but HR continued to be elevated, thus BB/digoxin were introduced. He was unaware of his heart rhythm/rate and denied any palpitations or chest pain whatsoever. CE's were negative initially, BNP was 1218, glucose 170, and CXR showed a R pleural effusion. He was subsequently found to be be a new diabetic with an A1C of 7.1. Dr. Tenny Craw performed a bedside echo and felt his LV function was depressed. He was initiated on IV diuresis over the next several days in addition to uptitration of rate control meds. Cardiac enzymes remained negative. Formal 2D echo demonstrated EF of 30% on 01/19/11.  Despite rate  control medicines, his rates continued to be high. He was kept NPO for plan for TEE/DCCV. This was done on 01/20/11 by Dr. Tenny Craw; the patient was converted to SR with 200J synchronized biphasic energy. He maintained SR the next 2 days, and was felt to be compensated from a heart failure standpoint today and was thus changed to po lasix today. He has also been initiated on ACEI given his LV dysfunction. Dr. Swaziland has seen and examined him today and feels he is stable for discharge. Dr. Swaziland feels at some point he may benefit from risk stratification given his decreased EF, but will have Dr. Tenny Craw determine that as an outpatient. We will defer intitiation of anti-hyperglycemic agent to primary care. He was given a hand-written prescription for a glucometer, diabetic lancets, and diabetic test strips. He was instructed to follow up with his PCP to discuss continued follow-up and safe strategies while he is cutting down on alcohol, as well as to discuss diabetes management.   Discharge Vitals: Blood pressure 102/62, pulse 80, temperature 97.8 F (36.6 C), temperature source Oral, resp. rate 18, height 5\' 10"  (1.778 m), weight 274 lb 11.1 oz (124.6 kg), SpO2 92.00%.  Labs: Lab Results  Component Value Date   WBC 7.5 01/19/2011   HGB 13.6 01/19/2011   HCT 41.3 01/19/2011   MCV 98.1 01/19/2011   PLT 226 01/19/2011    Lab 01/22/11 0549 01/17/11 1247  NA 136 --  K 3.6 --  CL 96 --  CO2 29 --  BUN 16 --  CREATININE  0.84 --  CALCIUM 9.4 --  PROT -- 7.3  BILITOT -- 1.3*  ALKPHOS -- 98  ALT -- 18  AST -- 16  GLUCOSE 124* --    Lab Results  Component Value Date   CHOL 110 01/18/2011   HDL 34* 01/18/2011   LDLCALC 57 01/18/2011   TRIG 96 01/18/2011    Diagnostic Studies/Procedures:  1. Chest 2 View 01/17/2011 -  IMPRESSION:  1.  Right pleural effusion with decreased aeration to the right lung base.   2. 2D echo 01/19/2011 - - Left ventricle: Endocardium not well seen Diffuse hypokinesis  worse in the inferior wall and septum Consider rechecking EF when rate better controlled The cavity size was moderately dilated. Wall thickness was increased in a pattern of mild LVH. The estimated ejection fraction was 30%. - Left atrium: The atrium was mildly dilated. - Right atrium: The atrium was mildly dilated. - Atrial septum: No defect or patent foramen ovale was identified. 3. TEE 01/20/11 - Left atrium: No evidence of thrombus in the atrial cavity or appendage. - Right atrium: No evidence of thrombus in the appendage. - Atrial septum: No defect or patent foramen ovale was identified. LVEF is severely depressed at approximately25 to 30%.    Discharge Medications   Current Discharge Medication List    START taking these medications   Details  aspirin EC 81 MG EC tablet Take 1 tablet (81 mg total) by mouth daily.    digoxin (LANOXIN) 0.25 MG tablet Take 1 tablet (0.25 mg total) by mouth daily. Qty: 30 tablet, Refills: 3    furosemide (LASIX) 40 MG tablet Take 1 tablet (40 mg total) by mouth 2 (two) times daily. Qty: 60 tablet, Refills: 6    lisinopril (PRINIVIL,ZESTRIL) 5 MG tablet Take 1 tablet (5 mg total) by mouth daily. Qty: 30 tablet, Refills: 6    metoprolol tartrate (LOPRESSOR) 50 MG tablet Take 1 tablet (50 mg total) by mouth 2 (two) times daily. Qty: 60 tablet, Refills: 6    warfarin (COUMADIN) 10 MG tablet Take 1 tablet (10 mg total) by mouth as directed. Take 1 tablet tomorrow (01/23/11). On 01/24/11, you will have your INR checked before taking your next dose to determine if any changes are needed. Qty: 30 tablet, Refills: 6      Per pharmacy recommendation, he will receive a 12.5mg  dose of Coumadin prior to D/C, then go home on 10mg  daily with INR check on Friday.  Disposition   The patient will be discharged in stable condition to home. Discharge Orders    Future Appointments: Provider: Department: Dept Phone: Center:   01/24/2011 4:15 PM Raul Del, RN  [facility] [phone] None   02/03/2011 11:00 AM Beatrice Lecher, PA [facility] [phone] [facility]     Future Orders Please Complete By Expires   Diet - low sodium heart healthy      Comments:   And diabetic diet. Talk to PCP about strategies for following you while you cut down on alcohol.   Increase activity slowly      Discharge instructions      Comments:   It is very important to weigh yourself daily - please see below for instructions for heart failure patients. Check blood sugar once per day and follow up with Primary Care Provider in the next several days to discuss management of diabetes.     Follow-up Information    Follow up with  [facility] ([facility] at our office -  you will come in for your blood test on 01/24/11  at 4:15pm)    Contact information:   [address] [place] [address]       Follow up with Tereso Newcomer, PA. Tereso Newcomer PA-C works closely with Dr. Tenny Craw - you will follow up with him at our office 12/10 at 11am)    Contact information:   [address] [place] [address] [phone]       Follow up with Primary Care Provider. (Please meet with your primary care doctor within the next few days to discuss your new diabetes management as well as strategies for safely cutting down on alcohol use. Check blood sugar once per day and take log to this appointment.)          At discharge, I discussed return-to-work recommendations with Dr. Swaziland who would like him to be seen in clinic prior to clearing him - his appt is 02/03/11 (Dr. Tenny Craw is not in clinic next week so this is the next available date).  Duration of Discharge Encounter: Greater than 30 minutes including physician and PA time.  Signed, Ronie Spies PA-C 01/22/2011, 9:59 AM

## 2011-01-22 NOTE — Discharge Summary (Signed)
Patient seen and examined and history reviewed. Agree with above findings and plan.  Thedora Hinders 01/22/2011 12:08 PM

## 2011-01-24 ENCOUNTER — Ambulatory Visit (INDEPENDENT_AMBULATORY_CARE_PROVIDER_SITE_OTHER): Payer: BC Managed Care – PPO | Admitting: *Deleted

## 2011-01-24 DIAGNOSIS — I4891 Unspecified atrial fibrillation: Secondary | ICD-10-CM

## 2011-01-24 DIAGNOSIS — Z7901 Long term (current) use of anticoagulants: Secondary | ICD-10-CM

## 2011-01-24 NOTE — Patient Instructions (Addendum)

## 2011-01-31 ENCOUNTER — Encounter: Payer: Self-pay | Admitting: *Deleted

## 2011-01-31 ENCOUNTER — Ambulatory Visit (INDEPENDENT_AMBULATORY_CARE_PROVIDER_SITE_OTHER): Payer: BC Managed Care – PPO | Admitting: *Deleted

## 2011-01-31 DIAGNOSIS — I4891 Unspecified atrial fibrillation: Secondary | ICD-10-CM

## 2011-01-31 DIAGNOSIS — Z7901 Long term (current) use of anticoagulants: Secondary | ICD-10-CM | POA: Insufficient documentation

## 2011-01-31 LAB — POCT INR: INR: 2.6

## 2011-02-03 ENCOUNTER — Encounter: Payer: Self-pay | Admitting: Physician Assistant

## 2011-02-03 ENCOUNTER — Ambulatory Visit (INDEPENDENT_AMBULATORY_CARE_PROVIDER_SITE_OTHER): Payer: BC Managed Care – PPO | Admitting: Physician Assistant

## 2011-02-03 VITALS — BP 110/70 | HR 100 | Ht 71.0 in | Wt 260.4 lb

## 2011-02-03 DIAGNOSIS — I5022 Chronic systolic (congestive) heart failure: Secondary | ICD-10-CM

## 2011-02-03 DIAGNOSIS — R0683 Snoring: Secondary | ICD-10-CM | POA: Insufficient documentation

## 2011-02-03 DIAGNOSIS — E119 Type 2 diabetes mellitus without complications: Secondary | ICD-10-CM

## 2011-02-03 DIAGNOSIS — R0609 Other forms of dyspnea: Secondary | ICD-10-CM

## 2011-02-03 DIAGNOSIS — F1010 Alcohol abuse, uncomplicated: Secondary | ICD-10-CM

## 2011-02-03 DIAGNOSIS — I429 Cardiomyopathy, unspecified: Secondary | ICD-10-CM

## 2011-02-03 DIAGNOSIS — I4891 Unspecified atrial fibrillation: Secondary | ICD-10-CM

## 2011-02-03 DIAGNOSIS — F17200 Nicotine dependence, unspecified, uncomplicated: Secondary | ICD-10-CM

## 2011-02-03 DIAGNOSIS — M109 Gout, unspecified: Secondary | ICD-10-CM | POA: Insufficient documentation

## 2011-02-03 LAB — BASIC METABOLIC PANEL
GFR: 86.15 mL/min (ref >60.00)
Glucose, Bld: 174 mg/dL — ABNORMAL HIGH (ref 70–99)
Potassium: 4.2 mEq/L (ref 3.5–5.1)
Sodium: 133 mEq/L — ABNORMAL LOW (ref 135–145)

## 2011-02-03 MED ORDER — AMIODARONE HCL 400 MG PO TABS: 400.0000 mg | ORAL_TABLET | Freq: Two times a day (BID) | ORAL | Status: DC

## 2011-02-03 NOTE — Assessment & Plan Note (Signed)
I have asked him to followup with his PCP for management.

## 2011-02-03 NOTE — Assessment & Plan Note (Signed)
He likely has sleep apnea.  Controlling this will be important in controlling his rhythm.  We will need to consider sleep testing after his rhythm is better controlled.

## 2011-02-03 NOTE — Assessment & Plan Note (Signed)
He denies any current use.

## 2011-02-03 NOTE — Progress Notes (Signed)
[address] [place], [place]  [address] Phone: [phone] Fax:  [phone]  Date:  02/03/2011   Name:  Bryan Rice       DOB:  Feb 02, 1961 MRN:  [id number]  PCP:  Dr. Cathey Endow Primary Cardiologist:  Dr. Dietrich Pates    History of Present Illness: Bryan Rice is a 50 y.o. male who presents for post hospital follow up.  He has a history of tobacco and alcohol abuse.  He was admitted 11/23-11/28 with acute systolic heart failure secondary to newly diagnosed cardiomyopathy with an ejection fraction of 30% in the setting of persistent atrial fibrillation with rapid ventricular rate.  He presented with a 3 week history of worsening dyspnea on exertion, orthopnea, PND and edema.  He was noted to be in atrial fibrillation with heart rates in the 160s.  Heart rate control was attempted with diltiazem, beta blocker and digoxin.  He had a right pleural effusion on chest x-ray and a markedly elevated BNP.  He was diuresed with IV Lasix.  Echo 01/19/11:  Diffuse HK worse in Inf wall and septum, mod LVE, mild LVH, EF 30%, mild BAE.  Heart rate continued to be difficult to control.  He underwent TEE-DCCV 01/20/11.  He maintained sinus rhythm at discharge.  EF was 25-30% by TEE.  He was placed on Coumadin.  He was maintained on CIWA protocol without signs of withdrawal.  A1c was elevated at 7.1 which was a new diagnosis of diabetes and he has been instructed to follow up with his PCP.  At discharge, it was felt that he may need outpatient risk stratification but does be left up to Dr. Tenny Craw.  Labs: Hgb 13.6, K 3.6, creatinine 0.84, ALT 18, TC 110, TG 96, HDL 34, LDL 57, BNP 1065.    Overall, he feels better.  His energy is not quite back to normal but getting better.  He denies chest pain.  He describes class II-IIb dyspnea.  He denies orthopnea, PND or significant edema.  He denies palpitations.  However, when he checks his pulse he notes that it has been irregular.  He is trying to quit  cigarettes.  He has not had any alcohol to drink since he left the hospital.  He does note right great toe and heel pain consistent with his previous gout.  Past Medical History  Diagnosis Date  . DM2 (diabetes mellitus, type 2)     Hemoglobin A1c 7.1 in 11/12  . Alcohol abuse   . Tobacco abuse   . Atrial fibrillation     s/p TEE-DCCV 11/12;  amiodarone started 02/03/11  . Chronic systolic heart failure   . Arthritis     left elbow  . Gout   . Cardiomyopathy     In the setting of afib with rvr 11/12;  Echo 01/19/11:  Diffuse HK worse in Inf wall and septum, mod LVE, mild LVH, EF 30%, mild BAE    Current Outpatient Prescriptions  Medication Sig Dispense Refill  . aspirin EC 81 MG EC tablet Take 1 tablet (81 mg total) by mouth daily.      . digoxin (LANOXIN) 0.25 MG tablet Take 1 tablet (0.25 mg total) by mouth daily.  30 tablet  3  . furosemide (LASIX) 40 MG tablet Take 1 tablet (40 mg total) by mouth 2 (two) times daily.  60 tablet  6  . lisinopril (PRINIVIL,ZESTRIL) 5 MG tablet Take 1 tablet (5 mg total) by mouth daily.  30 tablet  6  . metoprolol tartrate (LOPRESSOR) 50 MG tablet Take 1 tablet (50 mg total) by mouth 2 (two) times daily.  60 tablet  6  . warfarin (COUMADIN) 10 MG tablet Take 1 tablet (10 mg total) by mouth as directed. Take 1 tablet tomorrow (01/23/11). On 01/24/11, you will have your INR checked before taking your next dose to determine if any changes are needed.  30 tablet  6    Allergies: No Known Allergies  History  Substance Use Topics  . Smoking status: Current Everyday Smoker -- 2.0 packs/day for 35 years    Types: Cigarettes  . Smokeless tobacco: Not on file  . Alcohol Use: 4.8 oz/week    8 Cans of beer per week     In winter, 4 beers 1-2x/week. In summer, >6 beers daily.     ROS:  Please see the history of present illness.   No bleeding problems.  He notes a history of snoring.  He also admits to daytime hypersomnolence.  All other systems reviewed  and negative.   PHYSICAL EXAM: VS:  BP 110/70  Pulse 100  Ht 5\' 11"  (1.803 m)  Wt 260 lb 6.4 oz (118.117 kg)  BMI 36.32 kg/m2 Well nourished, well developed, in no acute distress HEENT: normal Neck: no JVD Cardiac:  normal S1, S2; Irregularly irregular; no murmur Lungs:  Decreased breath sounds bilaterally, no wheezing, rhonchi or rales Abd: soft, nontender, no hepatomegaly Ext: no edema Skin: warm and dry MSK: No significant redness noted over right great toe; mild erythema noted over right heel with mild tenderness Neuro:  CNs 2-12 intact, no focal abnormalities noted Psych: Normal affect  EKG:   Atrial fibrillation, heart rate 100, normal axis, nonspecific ST-T wave changes  ASSESSMENT AND PLAN:

## 2011-02-03 NOTE — Assessment & Plan Note (Signed)
Volume appears stable.  Check a basic metabolic panel today.  Continue Lasix.

## 2011-02-03 NOTE — Assessment & Plan Note (Signed)
Likely tachycardia mediated.  He will need risk stratification at some point.  I suspect we can proceed to stress testing once his rhythm issue is controlled.

## 2011-02-03 NOTE — Patient Instructions (Addendum)
Your physician recommends that you schedule a follow-up appointment in: 2 WEEKS WITH DR. Tenny Craw, IF NOT AVAILABLE THEN WITH SCOTT WEAVER, PA-C SAME DAY DR. Tenny Craw IS IN THE OFFICE  Your physician has recommended you make the following change in your medication: START AMIODARONE 400 MG 1 TABLET TWICE DAILY.  Your physician has recommended that you have a pulmonary function test  WITH [facility]; DX STARTING AMIODARONE. Pulmonary Function Tests are a group of tests that measure how well air moves in and out of your lungs.  Your physician recommends that you schedule a follow-up appointment in: YOU NEED TO FOLLOW UP WITH COUMADIN CLINIC ON A WEEKLY BASIS AT THIS TIME PER SCOTT WEAVER, PA-C PENDING A POSSIBLE CARDIOVERSION    Your physician recommends that you return for lab work in: BMET 428.22, 427.31

## 2011-02-03 NOTE — Assessment & Plan Note (Signed)
There is a significant risk of using colchicine with amiodarone.  Therefore, I have asked him to use his indomethacin for 3 days only.  If this does not improve his gout, he should followup with his PCP.  At that point, I would consider prednisone.

## 2011-02-03 NOTE — Assessment & Plan Note (Addendum)
He is back in atrial fibrillation.  Given his cardiomyopathy was probably tachycardia mediated, restoring sinus rhythm as important.  I discussed his case today with Dr. Charlton Haws (DOD).  We will place him on amiodarone 400 mg twice daily.  I will make sure that he has weekly followup with the Coumadin clinic should he need recurrent cardioversion.  He will be brought back in followup in 2 weeks with Dr. Dietrich Pates or me.  TSH and LFTs were normal in the hospital.  He has a significant smoking history and I will set him up with pulmonary function testing with [facility].  Lab Results  Component Value Date   INR 2.6 01/31/2011   INR 2.9 01/24/2011   INR 2.03* 01/22/2011

## 2011-02-03 NOTE — Assessment & Plan Note (Signed)
He is trying to quit.  

## 2011-02-07 ENCOUNTER — Ambulatory Visit (INDEPENDENT_AMBULATORY_CARE_PROVIDER_SITE_OTHER): Payer: BC Managed Care – PPO | Admitting: *Deleted

## 2011-02-07 ENCOUNTER — Ambulatory Visit (HOSPITAL_COMMUNITY)
Admission: RE | Admit: 2011-02-07 | Discharge: 2011-02-07 | Disposition: A | Discharge status: Home / Self Care | Payer: BC Managed Care – PPO | Source: Ambulatory Visit | Attending: Physician Assistant | Admitting: Physician Assistant

## 2011-02-07 DIAGNOSIS — Z79899 Other long term (current) drug therapy: Secondary | ICD-10-CM | POA: Insufficient documentation

## 2011-02-07 DIAGNOSIS — R0683 Snoring: Secondary | ICD-10-CM

## 2011-02-07 DIAGNOSIS — Z7901 Long term (current) use of anticoagulants: Secondary | ICD-10-CM

## 2011-02-07 DIAGNOSIS — I5022 Chronic systolic (congestive) heart failure: Secondary | ICD-10-CM

## 2011-02-07 DIAGNOSIS — I4891 Unspecified atrial fibrillation: Secondary | ICD-10-CM

## 2011-02-07 LAB — PULMONARY FUNCTION TEST

## 2011-02-07 MED ORDER — ALBUTEROL SULFATE (5 MG/ML) 0.5% IN NEBU
2.5000 mg | INHALATION_SOLUTION | Freq: Once | RESPIRATORY_TRACT | Status: AC
  Administered 2011-02-07: 2.5 mg via RESPIRATORY_TRACT

## 2011-02-10 ENCOUNTER — Encounter: Payer: Self-pay | Admitting: Internal Medicine

## 2011-02-14 ENCOUNTER — Ambulatory Visit (INDEPENDENT_AMBULATORY_CARE_PROVIDER_SITE_OTHER): Payer: BC Managed Care – PPO | Admitting: *Deleted

## 2011-02-14 DIAGNOSIS — I4891 Unspecified atrial fibrillation: Secondary | ICD-10-CM

## 2011-02-14 DIAGNOSIS — Z7901 Long term (current) use of anticoagulants: Secondary | ICD-10-CM

## 2011-02-14 LAB — POCT INR: INR: 4.2

## 2011-02-21 ENCOUNTER — Ambulatory Visit (INDEPENDENT_AMBULATORY_CARE_PROVIDER_SITE_OTHER): Payer: BC Managed Care – PPO | Admitting: *Deleted

## 2011-02-21 ENCOUNTER — Encounter: Payer: Self-pay | Admitting: Physician Assistant

## 2011-02-21 ENCOUNTER — Ambulatory Visit (INDEPENDENT_AMBULATORY_CARE_PROVIDER_SITE_OTHER): Payer: BC Managed Care – PPO | Admitting: Physician Assistant

## 2011-02-21 ENCOUNTER — Encounter (HOSPITAL_COMMUNITY): Payer: Self-pay | Admitting: Pharmacy Technician

## 2011-02-21 ENCOUNTER — Other Ambulatory Visit: Payer: Self-pay | Admitting: Physician Assistant

## 2011-02-21 ENCOUNTER — Encounter: Payer: Self-pay | Admitting: *Deleted

## 2011-02-21 VITALS — BP 104/66 | HR 78 | Resp 18 | Ht 71.0 in | Wt 252.1 lb

## 2011-02-21 DIAGNOSIS — I4891 Unspecified atrial fibrillation: Secondary | ICD-10-CM

## 2011-02-21 DIAGNOSIS — Z7901 Long term (current) use of anticoagulants: Secondary | ICD-10-CM

## 2011-02-21 DIAGNOSIS — I5022 Chronic systolic (congestive) heart failure: Secondary | ICD-10-CM

## 2011-02-21 MED ORDER — SODIUM CHLORIDE 0.9 % IV SOLN: 250.0000 mL | INTRAVENOUS | Status: DC

## 2011-02-21 MED ORDER — HYDROCORTISONE 1 % EX CREA: 1.0000 "application " | TOPICAL_CREAM | Freq: Three times a day (TID) | CUTANEOUS | Status: DC | PRN

## 2011-02-21 MED ORDER — SODIUM CHLORIDE 0.9 % IJ SOLN: 3.0000 mL | INTRAMUSCULAR | Status: DC | PRN

## 2011-02-21 MED ORDER — SODIUM CHLORIDE 0.9 % IJ SOLN: 3.0000 mL | Freq: Two times a day (BID) | INTRAMUSCULAR | Status: DC

## 2011-02-21 NOTE — Assessment & Plan Note (Signed)
Volume is stable.  Continue current medical therapy.  Consider risk stratification once his rhythm is returned to sinus.

## 2011-02-21 NOTE — Progress Notes (Signed)
[address] [place], [place]  [address] Phone: [phone] Fax:  [phone]  Date:  02/21/2011   Name:  Bryan Rice       DOB:  1960/04/29 MRN:  [id number]  PCP:  Dr. Cathey Endow Primary Cardiologist:  Dr. Dietrich Pates    History of Present Illness: Bryan Rice is a 50 y.o. male who presents for follow up.  He has a history of tobacco and alcohol abuse.  He was admitted 12/2010 with acute systolic heart failure secondary to newly diagnosed cardiomyopathy with an ejection fraction of 30% in the setting of persistent atrial fibrillation with rapid ventricular rate.  He had a right pleural effusion on chest x-ray and was diuresed.  Echo 01/19/11:  Diffuse HK worse in Inf wall and septum, mod LVE, mild LVH, EF 30%, mild BAE.  Heart rate continued to be difficult to control and he underwent TEE-DCCV 01/20/11.  EF was 25-30% by TEE.  He was placed on Coumadin.  A1c was elevated at 7.1 which was a new diagnosis of diabetes and he has been instructed to follow up with his PCP.  I saw him 12/10 in follow up.  He was back in atrial fibrillation with a heart rate of 100.  I placed him on amiodarone.  PFTs have been arranged.  He was brought back today for follow up.  Follow up labs: Sodium 133, potassium 4.2, creatinine 1.0, glucose 174.  The patient denies chest pain, shortness of breath, syncope, orthopnea, PND or significant pedal edema.  Denies palpitations.  He thought he was back in NSR.  Past Medical History  Diagnosis Date  . DM2 (diabetes mellitus, type 2)     Hemoglobin A1c 7.1 in 11/12  . Alcohol abuse   . Tobacco abuse   . Atrial fibrillation     s/p TEE-DCCV 11/12;  amiodarone started 02/03/11  . Chronic systolic heart failure   . Arthritis     left elbow  . Gout   . Cardiomyopathy     In the setting of afib with rvr 11/12;  Echo 01/19/11:  Diffuse HK worse in Inf wall and septum, mod LVE, mild LVH, EF 30%, mild BAE    Current Outpatient Prescriptions    Medication Sig Dispense Refill  . amiodarone (PACERONE) 400 MG tablet Take 1 tablet (400 mg total) by mouth 2 (two) times daily.  60 tablet  11  . aspirin EC 81 MG EC tablet Take 1 tablet (81 mg total) by mouth daily.      . digoxin (LANOXIN) 0.25 MG tablet Take 1 tablet (0.25 mg total) by mouth daily.  30 tablet  3  . furosemide (LASIX) 40 MG tablet Take 1 tablet (40 mg total) by mouth 2 (two) times daily.  60 tablet  6  . lisinopril (PRINIVIL,ZESTRIL) 5 MG tablet Take 1 tablet (5 mg total) by mouth daily.  30 tablet  6  . metoprolol tartrate (LOPRESSOR) 50 MG tablet Take 1 tablet (50 mg total) by mouth 2 (two) times daily.  60 tablet  6  . warfarin (COUMADIN) 10 MG tablet Take 1 tablet (10 mg total) by mouth as directed. Take 1 tablet tomorrow (01/23/11). On 01/24/11, you will have your INR checked before taking your next dose to determine if any changes are needed.  30 tablet  6    Allergies: No Known Allergies  History  Substance Use Topics  . Smoking status: Current Everyday Smoker -- 2.0 packs/day for 35  years    Types: Cigarettes  . Smokeless tobacco: Not on file  . Alcohol Use: 4.8 oz/week    8 Cans of beer per week     In winter, 4 beers 1-2x/week. In summer, >6 beers daily.     ROS:  Please see the history of present illness.    All other systems reviewed and negative.   PHYSICAL EXAM: VS:  BP 104/66  Pulse 78  Resp 18  Ht 5\' 11"  (1.803 m)  Wt 252 lb 1.9 oz (114.361 kg)  BMI 35.16 kg/m2 Well nourished, well developed, in no acute distress HEENT: normal Neck: no JVD Cardiac:  normal S1, S2; Irregularly irregular; no murmur Lungs:  Decreased breath sounds bilaterally, no wheezing, rhonchi or rales Abd: soft, nontender, no hepatomegaly Ext: no edema Skin: warm and dry Neuro:  CNs 2-12 intact, no focal abnormalities noted Psych: Normal affect  EKG:    Atrial fibrillation, heart rate 78, normal axis, no significant change when compared to prior tracing  ASSESSMENT  AND PLAN:

## 2011-02-21 NOTE — Assessment & Plan Note (Addendum)
Lab Results  Component Value Date   INR 4.2 02/14/2011   INR 3.4 02/07/2011   INR 2.6 01/31/2011    He remains in atrial fibrillation.  He has had therapeutic INRs since 12/7.  His INR will be checked again today.  I discussed his case with Dr. Tenny Craw.  We will plan on proceeding with elective cardioversion next week.  His INR will be checked the day prior.  Decrease amiodarone to 400 mg daily.  Followup in 2-3 weeks with Dr. Tenny Craw.

## 2011-02-21 NOTE — Patient Instructions (Signed)
Your physician has recommended you make the following change in your medication: Decrease Amiodarone to 400 mg, 1 tablet once daily   Your physician has recommended that you have a Cardioversion (DCCV). Electrical Cardioversion uses a jolt of electricity to your heart either through paddles or wired patches attached to your chest. This is a controlled, usually prescheduled, procedure. Defibrillation is done under light anesthesia in the hospital, and you usually go home the day of the procedure. This is done to get your heart back into a normal rhythm. You are not awake for the procedure. Please see the instruction sheet given to you today.  HOLD DIGOXIN THE MORNING OF DCCV.  Your physician recommends that you schedule a follow-up appointment in: 2-3 weeks with Dr. Tenny Craw after DCCV  You will need to have your INR checked the day before your Cardioversion.  You have a standing appointment with our Coumadin Clinic February 26, 2011.

## 2011-02-24 ENCOUNTER — Other Ambulatory Visit: Payer: Self-pay | Admitting: *Deleted

## 2011-02-24 MED ORDER — WARFARIN SODIUM 10 MG PO TABS: 10.0000 mg | ORAL_TABLET | ORAL | Status: DC

## 2011-02-26 ENCOUNTER — Ambulatory Visit (INDEPENDENT_AMBULATORY_CARE_PROVIDER_SITE_OTHER): Payer: BC Managed Care – PPO | Admitting: *Deleted

## 2011-02-26 DIAGNOSIS — I4891 Unspecified atrial fibrillation: Secondary | ICD-10-CM

## 2011-02-26 DIAGNOSIS — Z7901 Long term (current) use of anticoagulants: Secondary | ICD-10-CM

## 2011-02-26 LAB — POCT INR: INR: 3.7

## 2011-02-27 ENCOUNTER — Other Ambulatory Visit: Payer: Self-pay

## 2011-02-27 ENCOUNTER — Ambulatory Visit (HOSPITAL_COMMUNITY)
Admission: RE | Admit: 2011-02-27 | Discharge: 2011-02-27 | Disposition: A | Discharge status: Home / Self Care | Payer: BC Managed Care – PPO | Source: Ambulatory Visit | Attending: Cardiology | Admitting: Cardiology

## 2011-02-27 ENCOUNTER — Ambulatory Visit (HOSPITAL_COMMUNITY): Payer: BC Managed Care – PPO | Admitting: *Deleted

## 2011-02-27 ENCOUNTER — Encounter (HOSPITAL_COMMUNITY): Payer: Self-pay | Admitting: *Deleted

## 2011-02-27 ENCOUNTER — Encounter (HOSPITAL_COMMUNITY)
Admission: RE | Disposition: A | Discharge status: Home / Self Care | Payer: Self-pay | Source: Ambulatory Visit | Attending: Cardiology

## 2011-02-27 DIAGNOSIS — I4891 Unspecified atrial fibrillation: Secondary | ICD-10-CM | POA: Insufficient documentation

## 2011-02-27 HISTORY — PX: CARDIOVERSION: SHX1299

## 2011-02-27 LAB — CBC
Hemoglobin: 16.6 g/dL (ref 13.0–17.0)
MCH: 32.3 pg (ref 26.0–34.0)
MCHC: 36.1 g/dL — ABNORMAL HIGH (ref 30.0–36.0)
Platelets: 203 10*3/uL (ref 150–400)
RBC: 5.14 MIL/uL (ref 4.22–5.81)

## 2011-02-27 LAB — BASIC METABOLIC PANEL
Calcium: 9.4 mg/dL (ref 8.4–10.5)
GFR calc non Af Amer: 85 mL/min — ABNORMAL LOW (ref >90)
Glucose, Bld: 154 mg/dL — ABNORMAL HIGH (ref 70–99)
Potassium: 3.9 mEq/L (ref 3.5–5.1)
Sodium: 136 mEq/L (ref 135–145)

## 2011-02-27 LAB — PROTIME-INR
INR: 3.42 — ABNORMAL HIGH (ref 0.00–1.49)
Prothrombin Time: 35 seconds — ABNORMAL HIGH (ref 11.6–15.2)

## 2011-02-27 LAB — GLUCOSE, CAPILLARY: Glucose-Capillary: 166 mg/dL — ABNORMAL HIGH (ref 70–99)

## 2011-02-27 SURGERY — CARDIOVERSION
Anesthesia: Monitor Anesthesia Care | Wound class: Clean

## 2011-02-27 MED ORDER — PROPOFOL 10 MG/ML IV BOLUS
INTRAVENOUS | Status: DC | PRN
  Administered 2011-02-27: 70 mg via INTRAVENOUS

## 2011-02-27 MED ORDER — SODIUM CHLORIDE 0.9 % IV SOLN
250.0000 mL | INTRAVENOUS | Status: DC
  Administered 2011-02-27: 1000 mL via INTRAVENOUS

## 2011-02-27 MED ORDER — SODIUM CHLORIDE 0.9 % IJ SOLN: 3.0000 mL | Freq: Two times a day (BID) | INTRAMUSCULAR | Status: DC

## 2011-02-27 MED ORDER — SODIUM CHLORIDE 0.9 % IJ SOLN: 3.0000 mL | INTRAMUSCULAR | Status: DC | PRN

## 2011-02-27 MED ORDER — HYDROCORTISONE 1 % EX CREA
1.0000 "application " | TOPICAL_CREAM | Freq: Three times a day (TID) | CUTANEOUS | Status: DC | PRN
  Filled 2011-02-27: qty 28

## 2011-02-27 NOTE — Transfer of Care (Signed)
Immediate Anesthesia Transfer of Care Note  Patient: Bryan Rice  Procedure(s) Performed:  CARDIOVERSION  Patient Location: PACU and Short Stay  Anesthesia Type: MAC  Level of Consciousness: awake, alert  and oriented  Airway & Oxygen Therapy: Patient Spontanous Breathing and Patient connected to nasal cannula oxygen  Post-op Assessment: Report given to PACU RN and Post -op Vital signs reviewed and stable  Post vital signs: Reviewed and stable  Complications: No apparent anesthesia complications

## 2011-02-27 NOTE — Preoperative (Signed)
Beta Blockers   Reason not to administer Beta Blockers:Not Applicable 

## 2011-02-27 NOTE — Anesthesia Preprocedure Evaluation (Addendum)
Anesthesia Evaluation  Patient identified by MRN, date of birth, ID band Patient awake    Reviewed: Allergy & Precautions, H&P , NPO status , Patient's Chart, lab work & pertinent test results, reviewed documented beta blocker date and time   Airway Mallampati: II TM Distance: >3 FB Neck ROM: Full    Dental  (+) Dental Advisory Given and Poor Dentition   Pulmonary          Cardiovascular hypertension, Pt. on home beta blockers + CAD + dysrhythmias     Neuro/Psych    GI/Hepatic   Endo/Other  Diabetes mellitus-, Type 2, Oral Hypoglycemic Agents  Renal/GU      Musculoskeletal   Abdominal   Peds  Hematology   Anesthesia Other Findings   Reproductive/Obstetrics                          Anesthesia Physical Anesthesia Plan  ASA: III  Anesthesia Plan: MAC   Post-op Pain Management:    Induction: Intravenous  Airway Management Planned: Mask  Additional Equipment:   Intra-op Plan:   Post-operative Plan:   Informed Consent: I have reviewed the patients History and Physical, chart, labs and discussed the procedure including the risks, benefits and alternatives for the proposed anesthesia with the patient or authorized representative who has indicated his/her understanding and acceptance.   Dental advisory given  Plan Discussed with: Anesthesiologist and Surgeon  Anesthesia Plan Comments:         Anesthesia Quick Evaluation

## 2011-02-27 NOTE — Anesthesia Postprocedure Evaluation (Signed)
  Anesthesia Post-op Note  Patient: Bryan Rice  Procedure(s) Performed:  CARDIOVERSION  Patient Location: PACU and Short Stay  Anesthesia Type: MAC  Level of Consciousness: awake, alert  and oriented  Airway and Oxygen Therapy: Patient Spontanous Breathing and Patient connected to nasal cannula oxygen  Post-op Pain: none  Post-op Assessment: Post-op Vital signs reviewed and Patient's Cardiovascular Status Stable  Post-op Vital Signs: Reviewed and stable  Complications: No apparent anesthesia complications

## 2011-02-27 NOTE — Procedures (Signed)
Electrical Cardioversion Procedure Note Bryan Rice [id number] Jul 02, 1960  Procedure: Electrical Cardioversion Indications:  Atrial Fibrillation  Procedure Details Consent: Risks of procedure as well as the alternatives and risks of each were explained to the (patient/caregiver).  Consent for procedure obtained. Time Out: Verified patient identification, verified procedure, site/side was marked, verified correct patient position, special equipment/implants available, medications/allergies/relevent history reviewed, required imaging and test results available.  Performed  Patient placed on cardiac monitor, pulse oximetry, supplemental oxygen as necessary.  Sedation given: Diprovan 70 mg IV; administered by anesthesia. Pacer pads placed anterior and posterior chest.  Cardioverted 1 time(s).  Cardioverted at 120J.  Evaluation Findings: Post procedure EKG shows: NSR Complications: None Patient did tolerate procedure well.   Olga Millers 02/27/2011, 10:57 AM

## 2011-02-28 ENCOUNTER — Encounter (HOSPITAL_COMMUNITY): Payer: Self-pay | Admitting: Cardiology

## 2011-03-07 ENCOUNTER — Ambulatory Visit (INDEPENDENT_AMBULATORY_CARE_PROVIDER_SITE_OTHER): Payer: BC Managed Care – PPO | Admitting: *Deleted

## 2011-03-07 DIAGNOSIS — I4891 Unspecified atrial fibrillation: Secondary | ICD-10-CM

## 2011-03-07 DIAGNOSIS — Z7901 Long term (current) use of anticoagulants: Secondary | ICD-10-CM

## 2011-03-07 LAB — POCT INR: INR: 2.6

## 2011-03-17 ENCOUNTER — Ambulatory Visit (INDEPENDENT_AMBULATORY_CARE_PROVIDER_SITE_OTHER): Payer: BC Managed Care – PPO | Admitting: Internal Medicine

## 2011-03-17 ENCOUNTER — Encounter: Payer: Self-pay | Admitting: Internal Medicine

## 2011-03-17 VITALS — BP 109/65 | HR 57 | Ht 71.0 in | Wt 244.0 lb

## 2011-03-17 DIAGNOSIS — I429 Cardiomyopathy, unspecified: Secondary | ICD-10-CM

## 2011-03-17 DIAGNOSIS — I4891 Unspecified atrial fibrillation: Secondary | ICD-10-CM

## 2011-03-17 NOTE — Progress Notes (Signed)
HPI  Patient is a 51 year old with a history of atrial fibrillation and cardiomyopathy.  He underwent QIO:NGEX in 11/12. He reverted to afib.  Placed on amiodarone.  Last seen by Wende Mott 12/28.  Still in afib.  Underwent DC cardioversion again. Since seen his breathing has been OK  NO CP.  No SOB.  No dizziness. No Known Allergies  Current Outpatient Prescriptions  Medication Sig Dispense Refill  . amiodarone (PACERONE) 400 MG tablet Take 400 mg by mouth daily.        Marland Kitchen aspirin 81 MG EC tablet Take 81 mg by mouth daily.        . digoxin (LANOXIN) 0.25 MG tablet Take 0.25 mg by mouth daily.        . furosemide (LASIX) 40 MG tablet Take 40 mg by mouth 2 (two) times daily.        Marland Kitchen lisinopril (PRINIVIL,ZESTRIL) 5 MG tablet Take 5 mg by mouth daily.        . metoprolol (LOPRESSOR) 50 MG tablet Take 50 mg by mouth 2 (two) times daily.        Marland Kitchen warfarin (COUMADIN) 10 MG tablet Take 1 tablet (10 mg total) by mouth as directed.  30 tablet  3  . DISCONTD: amiodarone (PACERONE) 400 MG tablet Take 1 tablet (400 mg total) by mouth 2 (two) times daily.  60 tablet  11   Current Facility-Administered Medications  Medication Dose Route Frequency Provider Last Rate Last Dose  . hydrocortisone cream 1 % 1 application  1 application Topical TID PRN Beatrice Lecher, PA        Past Medical History  Diagnosis Date  . DM2 (diabetes mellitus, type 2)     Hemoglobin A1c 7.1 in 11/12  . Alcohol abuse   . Tobacco abuse   . Atrial fibrillation     s/p TEE-DCCV 11/12;  amiodarone started 02/03/11  . Chronic systolic heart failure   . Arthritis     left elbow  . Gout   . Cardiomyopathy     In the setting of afib with rvr 11/12;  Echo 01/19/11:  Diffuse HK worse in Inf wall and septum, mod LVE, mild LVH, EF 30%, mild BAE    Past Surgical History  Procedure Date  . Cardioversion 01/20/2011    Procedure: CARDIOVERSION;  Surgeon: Pricilla Riffle, MD;  Location: [facility] OR;  Service: Cardiovascular;  Laterality: N/A;   . Tee without cardioversion 01/20/2011    Procedure: TRANSESOPHAGEAL ECHOCARDIOGRAM (TEE);  Surgeon: Pricilla Riffle, MD;  Location[facility] ENDOSCOPY;  Service: Cardiovascular;  Laterality: N/A;  . Cardioversion 01/20/2011    Procedure: CARDIOVERSION;  Surgeon: Pricilla Riffle, MD;  Location: [facility] ENDOSCOPY;  Service: Cardiovascular;  Laterality: N/A;  . Cardioversion 02/27/2011    Procedure: CARDIOVERSION;  Surgeon: Lewayne Bunting, MD;  Location: [facility] OR;  Service: Cardiovascular;  Laterality: N/A;    Family History  Problem Relation Age of Onset  . Diabetes Father     Multiple relatives in his family as well  . Peripheral vascular disease Mother     Died from complications from an aortic surgery  . Anesthesia problems Neg Hx   . Hypotension Neg Hx   . Malignant hyperthermia Neg Hx   . Pseudochol deficiency Neg Hx     History   Social History  . Marital Status: Single    Spouse Name: N/A    Number of Children: N/A  . Years of Education: N/A  Occupational History  . Service technician     Working with [employer]   Social History Main Topics  . Smoking status: Current Everyday Smoker -- 2.0 packs/day for 35 years    Types: Cigarettes  . Smokeless tobacco: Not on file  . Alcohol Use: 4.8 oz/week    8 Cans of beer per week     In winter, 4 beers 1-2x/week. In summer, >6 beers daily.  . Drug Use: No  . Sexually Active: Not on file   Other Topics Concern  . Not on file   Social History Narrative  . No narrative on file    Review of Systems:  All systems reviewed.  They are negative to the above problem except as previously stated.  Vital Signs: BP 109/65  Pulse 57  Ht 5\' 11"  (1.803 m)  Wt 244 lb (110.678 kg)  BMI 34.03 kg/m2  Physical Exam  Patient is in NAD  HEENT:  Normocephalic, atraumatic. EOMI, PERRLA.  Neck: JVP is normal. No thyromegaly. No bruits.  Lungs: clear to auscultation. No rales no wheezes.  Heart: Regular rate and  rhythm. Normal S1, S2. No S3.   No significant murmurs. PMI not displaced.  Abdomen:  Supple, nontender. Normal bowel sounds. No masses. No hepatomegaly.  Extremities:   Good distal pulses throughout. No lower extremity edema.  Musculoskeletal :moving all extremities.  Neuro:   alert and oriented x3.  CN II-XII grossly intact.  EKG:  Sinus bradycardia.  55 bpm.  ST depression with biphasic/T wave inversion II, IIII, AVF, V2 to V6.   Assessment and Plan:

## 2011-03-17 NOTE — Patient Instructions (Addendum)
Continue Amiodarone 400 mg until Feb. 1st , then decrease to 200 mg every day.  Your physician has requested that you have en exercise stress myoview. For further information please visit [email or web address] Please follow instruction sheet, as given.  Follow up with Dr.Ross in April 2013.

## 2011-03-18 NOTE — Assessment & Plan Note (Signed)
Volume status looks good.  Keep on same regimen.  WIll need to have repeat echo to reassess LVEF later this spring.

## 2011-03-18 NOTE — Assessment & Plan Note (Signed)
Remains in SR.  I would keep amio at current dose and decrease to 200 per day on Feb 1.  Keep on coumadin.  I would recomm stopping digoxin.

## 2011-03-19 ENCOUNTER — Ambulatory Visit (HOSPITAL_COMMUNITY): Payer: BC Managed Care – PPO | Attending: Cardiology | Admitting: Radiology

## 2011-03-19 VITALS — BP 111/62 | Ht 71.0 in | Wt 236.0 lb

## 2011-03-19 DIAGNOSIS — E669 Obesity, unspecified: Secondary | ICD-10-CM | POA: Insufficient documentation

## 2011-03-19 DIAGNOSIS — R5381 Other malaise: Secondary | ICD-10-CM | POA: Insufficient documentation

## 2011-03-19 DIAGNOSIS — E119 Type 2 diabetes mellitus without complications: Secondary | ICD-10-CM | POA: Insufficient documentation

## 2011-03-19 DIAGNOSIS — R0789 Other chest pain: Secondary | ICD-10-CM | POA: Insufficient documentation

## 2011-03-19 DIAGNOSIS — I429 Cardiomyopathy, unspecified: Secondary | ICD-10-CM

## 2011-03-19 DIAGNOSIS — F17200 Nicotine dependence, unspecified, uncomplicated: Secondary | ICD-10-CM | POA: Insufficient documentation

## 2011-03-19 DIAGNOSIS — R079 Chest pain, unspecified: Secondary | ICD-10-CM

## 2011-03-19 DIAGNOSIS — I4891 Unspecified atrial fibrillation: Secondary | ICD-10-CM | POA: Insufficient documentation

## 2011-03-19 MED ORDER — REGADENOSON 0.4 MG/5ML IV SOLN
0.4000 mg | Freq: Once | INTRAVENOUS | Status: AC
  Administered 2011-03-19: 0.4 mg via INTRAVENOUS

## 2011-03-19 MED ORDER — TECHNETIUM TC 99M TETROFOSMIN IV KIT
30.0000 | PACK | Freq: Once | INTRAVENOUS | Status: AC | PRN
  Administered 2011-03-19: 30 via INTRAVENOUS

## 2011-03-19 MED ORDER — TECHNETIUM TC 99M TETROFOSMIN IV KIT
10.0000 | PACK | Freq: Once | INTRAVENOUS | Status: AC | PRN
  Administered 2011-03-19: 10 via INTRAVENOUS

## 2011-03-19 NOTE — Progress Notes (Addendum)
[facility] SITE 3 NUCLEAR MED [address] [place] [address] [phone]  Cardiology Nuclear Med Study  Bryan Rice is a 51 y.o. male [id number] [birth year]   Nuclear Med Background Indication for Stress Test:  Evaluation for Ischemia and Follow-Up Post Cardioversion  History: 11/12 New AF and 02/27/11 Cardioversion; 11/12 Echo:EF=30% Cardiac Risk Factors: NIDDM, Obesity and Smoker  Symptoms:  Chest Tightness with Atrial Fibrillation (last episode of chest discomfort:none since cardioversion) and Fatigue   Nuclear Pre-Procedure Caffeine/Decaff Intake:  None NPO After: 7:00pm   Lungs:  Diminished, but clear.  O2 SAT 96% on RA. IV 0.9% NS with Angio Cath:  20g  IV Site: R Antecubital x 1, tolerated well IV Started by:  Irean Hong, RN  Chest Size (in):  48 Cup Size: n/a  Height: 5\' 11"  (1.803 m)  Weight:  236 lb (107.049 kg)  BMI:  Body mass index is 32.92 kg/(m^2). Tech Comments:  Metoprolol held this am; no medications taken today, per patient.    Nuclear Med Study 1 or 2 day study: 1 day  Stress Test Type:  Treadmill/Lexiscan  Reading MD: Marca Ancona, MD  Order Authorizing Provider:  Dietrich Pates, MD  Resting Radionuclide: Technetium 20m Tetrofosmin  Resting Radionuclide Dose: 11.0 mCi   Stress Radionuclide:  Technetium 18m Tetrofosmin  Stress Radionuclide Dose: 33.0 mCi           Stress Protocol Rest HR: 50 Stress HR: 82  Rest BP: Sitting 111/62  Standing 100/53 Stress BP: 125/60  Exercise Time (min): 2:00 METS: n/a   Predicted Max HR: 168 bpm % Max HR: 48.81 bpm Rate Pressure Product: 21308   Dose of Adenosine (mg):  n/a Dose of Lexiscan: 0.4 mg  Dose of Atropine (mg): n/a Dose of Dobutamine: n/a mcg/kg/min (at max HR)  Stress Test Technologist: Smiley Houseman, CMA-N  Nuclear Technologist:  Domenic Polite, CNMT     Rest Procedure:  Myocardial perfusion imaging was performed at rest 45 minutes following the intravenous administration of  Technetium 83m Tetrofosmin.  Rest ECG: Marked ST-T wave changes.  Stress Procedure:  The patient received IV Lexiscan 0.4 mg over 15-seconds with concurrent low level exercise and then Technetium 33m Tetrofosmin was injected at 30-seconds while the patient continued walking one more minute.  There were no significant changes with Lexiscan.  Quantitative spect images were obtained after a 45-minute delay.  Stress ECG: No significant change from baseline ECG  Baseline abnormal ECG with inferior and anterolateral ST depression and T wave inversions.  QPS Raw Data Images:  Normal; no motion artifact; normal heart/lung ratio. Stress Images:  Mild basal to mid inferior perfusion defect.  Rest Images:  Normal homogeneous uptake in all areas of the myocardium. Subtraction (SDS):  Mild primarily reversible basal to mid inferior perfusion defect.  Transient Ischemic Dilatation (Normal <1.22):  1.19 Lung/Heart Ratio (Normal <0.45):  0.33  Quantitative Gated Spect Images QGS EDV:  144 ml QGS ESV:  68 ml QGS cine images:  Mild global hypokinesis.  QGS EF: 52%  Impression Exercise Capacity:  Lexiscan with low level exercise. BP Response:  Normal blood pressure response. Clinical Symptoms:  Short of breath, lightheaded.  ECG Impression:  Baseline inferior and anterolateral ST depression and T wave inversions, no significant change with infusion.  Comparison with Prior Nuclear Study: No previous nuclear study performed  Overall Impression:  Low risk stress nuclear study.  There is a mild reversible basal to mid inferior perfusion defect.  This  could be attenuation-related but cannot rule out ischemia.  EF 52% with mild global hypokinesis.   Sydny Schnitzler [employer]

## 2011-03-20 ENCOUNTER — Encounter (HOSPITAL_COMMUNITY): Payer: BC Managed Care – PPO | Admitting: Radiology

## 2011-03-20 ENCOUNTER — Telehealth: Payer: Self-pay | Admitting: Internal Medicine

## 2011-03-20 NOTE — Telephone Encounter (Signed)
lmom for ptcb to discuss PFT's and recommendations per scott weaver, pa-c. I will try to reach pt again tomorrow 03/21/11. Danielle Rankin

## 2011-03-20 NOTE — Telephone Encounter (Signed)
Fu call  Pt is not at home please call cell

## 2011-03-20 NOTE — Telephone Encounter (Signed)
Fu call °Patient returning your call °

## 2011-03-21 ENCOUNTER — Encounter: Payer: BC Managed Care – PPO | Admitting: *Deleted

## 2011-03-21 ENCOUNTER — Telehealth: Payer: Self-pay | Admitting: Internal Medicine

## 2011-03-21 NOTE — Telephone Encounter (Addendum)
Called patient and reviewed results of PFT's. Advised will review with Dr.Ross the use of Amiodarone long term and call him back. Gave prelim. report of nuc scan but advised that Dr.Ross still needs to review.

## 2011-03-21 NOTE — Telephone Encounter (Signed)
FU Call: Pt returning call from our office regarding pt test results. Please return pt call to discuss further.  

## 2011-03-25 ENCOUNTER — Telehealth: Payer: Self-pay | Admitting: Internal Medicine

## 2011-03-25 ENCOUNTER — Ambulatory Visit (INDEPENDENT_AMBULATORY_CARE_PROVIDER_SITE_OTHER): Payer: BC Managed Care – PPO | Admitting: Pharmacist

## 2011-03-25 DIAGNOSIS — I4891 Unspecified atrial fibrillation: Secondary | ICD-10-CM

## 2011-03-25 DIAGNOSIS — Z7901 Long term (current) use of anticoagulants: Secondary | ICD-10-CM

## 2011-03-25 NOTE — Telephone Encounter (Signed)
Will forward to Dr. Tenny Craw and her nurse, Annice Pih.

## 2011-03-25 NOTE — Telephone Encounter (Signed)
New Problem   Patient returning a call he received from Dr. Tenny Craw, he can be reached on mobile# [phone].  Patient also has an appnt today 1/29 in coumadine at 4:15pm should she need to see him.

## 2011-03-26 ENCOUNTER — Telehealth: Payer: Self-pay | Admitting: Internal Medicine

## 2011-03-26 NOTE — Telephone Encounter (Signed)
Dr. Tenny Craw called patient and ordered an echo. Will place order and send to [facility]

## 2011-03-26 NOTE — Telephone Encounter (Signed)
Called patient. Myoview was a low  Risk study.  Reviewed.  Inferior wall with changes that were not clearly c/w signif ischemia.  May represetn artifact.  LVEF calc at 52%  I recomm:  OK to proceed with mouth surgery if needed  Get echo to confirm EF Stay on same meds  Will review echo before say ok to work (does [employer])  Regarding PFTs  Need to review with EP.  Would keep on amio for now.  He is due to decrease dose.

## 2011-03-27 ENCOUNTER — Telehealth: Payer: Self-pay | Admitting: Physician Assistant

## 2011-03-27 NOTE — Telephone Encounter (Signed)
Mailed Records to Pt Home Address, ROI On File  03/27/11/KM

## 2011-04-03 ENCOUNTER — Other Ambulatory Visit (HOSPITAL_COMMUNITY): Payer: BC Managed Care – PPO

## 2011-04-03 ENCOUNTER — Telehealth: Payer: Self-pay | Admitting: Internal Medicine

## 2011-04-03 NOTE — Telephone Encounter (Signed)
Patient came by the office. Disability form will be completed by Dr.Ross and then will call him.

## 2011-04-03 NOTE — Telephone Encounter (Signed)
New Problem   Patient request call back from nurse regarding status of disability statement.  He can be reached on mobile#

## 2011-04-04 NOTE — Telephone Encounter (Signed)
Dr.Ross advised me that patient will stay on Amiodarone for now and he is aware of this.

## 2011-04-08 ENCOUNTER — Ambulatory Visit (HOSPITAL_COMMUNITY): Payer: BC Managed Care – PPO | Attending: Cardiology

## 2011-04-08 ENCOUNTER — Ambulatory Visit (INDEPENDENT_AMBULATORY_CARE_PROVIDER_SITE_OTHER): Payer: BC Managed Care – PPO | Admitting: *Deleted

## 2011-04-08 ENCOUNTER — Other Ambulatory Visit: Payer: Self-pay

## 2011-04-08 DIAGNOSIS — F17200 Nicotine dependence, unspecified, uncomplicated: Secondary | ICD-10-CM | POA: Insufficient documentation

## 2011-04-08 DIAGNOSIS — I4891 Unspecified atrial fibrillation: Secondary | ICD-10-CM

## 2011-04-08 DIAGNOSIS — I428 Other cardiomyopathies: Secondary | ICD-10-CM | POA: Insufficient documentation

## 2011-04-08 DIAGNOSIS — F1010 Alcohol abuse, uncomplicated: Secondary | ICD-10-CM | POA: Insufficient documentation

## 2011-04-08 DIAGNOSIS — E119 Type 2 diabetes mellitus without complications: Secondary | ICD-10-CM | POA: Insufficient documentation

## 2011-04-08 DIAGNOSIS — Z7901 Long term (current) use of anticoagulants: Secondary | ICD-10-CM

## 2011-04-08 LAB — POCT INR: INR: 2.8

## 2011-04-08 MED ORDER — WARFARIN SODIUM 5 MG PO TABS: ORAL_TABLET | ORAL | Status: DC

## 2011-04-11 ENCOUNTER — Other Ambulatory Visit (INDEPENDENT_AMBULATORY_CARE_PROVIDER_SITE_OTHER): Payer: BC Managed Care – PPO | Admitting: *Deleted

## 2011-04-11 ENCOUNTER — Other Ambulatory Visit: Payer: Self-pay | Admitting: *Deleted

## 2011-04-11 DIAGNOSIS — IMO0002 Reserved for concepts with insufficient information to code with codable children: Secondary | ICD-10-CM

## 2011-04-11 DIAGNOSIS — I4891 Unspecified atrial fibrillation: Secondary | ICD-10-CM

## 2011-04-11 LAB — BASIC METABOLIC PANEL
CO2: 28 mEq/L (ref 19–32)
Calcium: 9 mg/dL (ref 8.4–10.5)
GFR: 96.33 mL/min (ref >60.00)
Potassium: 3.6 mEq/L (ref 3.5–5.1)
Sodium: 136 mEq/L (ref 135–145)

## 2011-04-11 LAB — PROTIME-INR
INR: 2.2 ratio — ABNORMAL HIGH (ref 0.8–1.0)
Prothrombin Time: 24.1 s — ABNORMAL HIGH (ref 10.2–12.4)

## 2011-04-11 MED ORDER — AMIODARONE HCL 400 MG PO TABS: ORAL_TABLET | ORAL | Status: DC

## 2011-04-11 NOTE — Telephone Encounter (Signed)
Patient came by today and Dr.Ross gave him the completed disability form.

## 2011-04-16 ENCOUNTER — Telehealth: Payer: Self-pay | Admitting: Internal Medicine

## 2011-04-16 NOTE — Telephone Encounter (Signed)
Fu call °Pt returning your call  °

## 2011-04-16 NOTE — Telephone Encounter (Signed)
Lab results given to pt.

## 2011-04-22 ENCOUNTER — Ambulatory Visit (INDEPENDENT_AMBULATORY_CARE_PROVIDER_SITE_OTHER): Payer: BC Managed Care – PPO | Admitting: *Deleted

## 2011-04-22 DIAGNOSIS — Z7901 Long term (current) use of anticoagulants: Secondary | ICD-10-CM

## 2011-04-22 DIAGNOSIS — I4891 Unspecified atrial fibrillation: Secondary | ICD-10-CM

## 2011-04-29 ENCOUNTER — Encounter: Payer: BC Managed Care – PPO | Admitting: *Deleted

## 2011-06-16 ENCOUNTER — Ambulatory Visit (INDEPENDENT_AMBULATORY_CARE_PROVIDER_SITE_OTHER): Payer: BC Managed Care – PPO | Admitting: *Deleted

## 2011-06-16 ENCOUNTER — Encounter: Payer: Self-pay | Admitting: Internal Medicine

## 2011-06-16 ENCOUNTER — Ambulatory Visit (INDEPENDENT_AMBULATORY_CARE_PROVIDER_SITE_OTHER): Payer: BC Managed Care – PPO | Admitting: Internal Medicine

## 2011-06-16 VITALS — BP 140/76 | HR 48 | Ht 71.0 in | Wt 241.8 lb

## 2011-06-16 DIAGNOSIS — Z7901 Long term (current) use of anticoagulants: Secondary | ICD-10-CM

## 2011-06-16 DIAGNOSIS — I4891 Unspecified atrial fibrillation: Secondary | ICD-10-CM

## 2011-06-16 DIAGNOSIS — E119 Type 2 diabetes mellitus without complications: Secondary | ICD-10-CM

## 2011-06-16 LAB — CBC WITH DIFFERENTIAL/PLATELET
Basophils Absolute: 0.1 10*3/uL (ref 0.0–0.1)
Eosinophils Absolute: 0.2 10*3/uL (ref 0.0–0.7)
HCT: 39.9 % (ref 39.0–52.0)
Lymphs Abs: 1.8 10*3/uL (ref 0.7–4.0)
MCHC: 34 g/dL (ref 30.0–36.0)
Monocytes Relative: 9.5 % (ref 3.0–12.0)
Platelets: 257 10*3/uL (ref 150.0–400.0)
RDW: 13 % (ref 11.5–14.6)

## 2011-06-16 LAB — BASIC METABOLIC PANEL
BUN: 17 mg/dL (ref 6–23)
Chloride: 98 mEq/L (ref 96–112)
Glucose, Bld: 174 mg/dL — ABNORMAL HIGH (ref 70–99)
Potassium: 3.8 mEq/L (ref 3.5–5.1)
Sodium: 135 mEq/L (ref 135–145)

## 2011-06-16 LAB — HEPATIC FUNCTION PANEL
AST: 16 U/L (ref 0–37)
Albumin: 3.9 g/dL (ref 3.5–5.2)
Alkaline Phosphatase: 82 U/L (ref 39–117)
Bilirubin, Direct: 0.1 mg/dL (ref 0.0–0.3)
Total Protein: 7.3 g/dL (ref 6.0–8.3)

## 2011-06-16 LAB — HEMOGLOBIN A1C: Hgb A1c MFr Bld: 6 % (ref 4.6–6.5)

## 2011-06-16 LAB — POCT INR: INR: 3

## 2011-06-16 LAB — LIPID PANEL
Cholesterol: 157 mg/dL (ref 0–200)
VLDL: 27.2 mg/dL (ref 0.0–40.0)

## 2011-06-16 LAB — TSH: TSH: 1.05 u[IU]/mL (ref 0.35–5.50)

## 2011-06-16 NOTE — Patient Instructions (Signed)
Lab work today. Will call you with results. 

## 2011-06-16 NOTE — Progress Notes (Signed)
HPI Patient is a 51 year old with a history of atrial fibrillation and CHF.  I last saw him in Jan 2013.  He was s/p cardioversion.  Continues on coumadin and amiodarone. Myoview showed inferior defect (base, mid) that on my review appeared consistent with probable diaphragm and not signif ischemia.  LVEF was 52%  Echo showed LVEF had normalized. Since seen no SOB.  Not dizzy. Never felt heart race.  Energy is good.  Back to work fully.  Still smoking less than a ppd.  Trying to quit. No Known Allergies  Current Outpatient Prescriptions  Medication Sig Dispense Refill  . amiodarone (PACERONE) 400 MG tablet One half a tab every day      . furosemide (LASIX) 40 MG tablet Take 40 mg by mouth 2 (two) times daily.        Marland Kitchen lisinopril (PRINIVIL,ZESTRIL) 5 MG tablet Take 5 mg by mouth daily.        . metoprolol (LOPRESSOR) 50 MG tablet Take 50 mg by mouth 2 (two) times daily.        Marland Kitchen warfarin (COUMADIN) 5 MG tablet Take 5mg  tablet by mouth as directed (up to 10mg  daily)  45 tablet  3  . DISCONTD: amiodarone (PACERONE) 400 MG tablet Take 1 tablet (400 mg total) by mouth 2 (two) times daily.  60 tablet  11  . DISCONTD: furosemide (LASIX) 40 MG tablet Take 1 tablet (40 mg total) by mouth 2 (two) times daily.  60 tablet  6  . DISCONTD: lisinopril (PRINIVIL,ZESTRIL) 5 MG tablet Take 1 tablet (5 mg total) by mouth daily.  30 tablet  6  . DISCONTD: metoprolol tartrate (LOPRESSOR) 50 MG tablet Take 1 tablet (50 mg total) by mouth 2 (two) times daily.  60 tablet  6   Current Facility-Administered Medications  Medication Dose Route Frequency Provider Last Rate Last Dose  . hydrocortisone cream 1 % 1 application  1 application Topical TID PRN Beatrice Lecher, PA        Past Medical History  Diagnosis Date  . DM2 (diabetes mellitus, type 2)     Hemoglobin A1c 7.1 in 11/12  . Alcohol abuse   . Tobacco abuse   . Atrial fibrillation     s/p TEE-DCCV 11/12;  amiodarone started 02/03/11  . Chronic systolic  heart failure   . Arthritis     left elbow  . Gout   . Cardiomyopathy     In the setting of afib with rvr 11/12;  Echo 01/19/11:  Diffuse HK worse in Inf wall and septum, mod LVE, mild LVH, EF 30%, mild BAE    Past Surgical History  Procedure Date  . Cardioversion 01/20/2011    Procedure: CARDIOVERSION;  Surgeon: Pricilla Riffle, MD;  Location: [facility] OR;  Service: Cardiovascular;  Laterality: N/A;  . Tee without cardioversion 01/20/2011    Procedure: TRANSESOPHAGEAL ECHOCARDIOGRAM (TEE);  Surgeon: Pricilla Riffle, MD;  Location: [facility] ENDOSCOPY;  Service: Cardiovascular;  Laterality: N/A;  . Cardioversion 01/20/2011    Procedure: CARDIOVERSION;  Surgeon: Pricilla Riffle, MD;  Location: [facility] ENDOSCOPY;  Service: Cardiovascular;  Laterality: N/A;  . Cardioversion 02/27/2011    Procedure: CARDIOVERSION;  Surgeon: Lewayne Bunting, MD;  Location: [facility] OR;  Service: Cardiovascular;  Laterality: N/A;    Family History  Problem Relation Age of Onset  . Diabetes Father     Multiple relatives in his family as well  . Peripheral vascular disease Mother     Died  from complications from an aortic surgery  . Anesthesia problems Neg Hx   . Hypotension Neg Hx   . Malignant hyperthermia Neg Hx   . Pseudochol deficiency Neg Hx     History   Social History  . Marital Status: Single    Spouse Name: N/A    Number of Children: N/A  . 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 Smoking status: Current Everyday Smoker -- 2.0 packs/day for 35 years    Types: Cigarettes  . Smokeless tobacco: Not on file  . Alcohol Use: 4.8 oz/week    8 Cans of beer per week     In winter, 4 beers 1-2x/week. In summer, >6 beers daily.  . Drug Use: No  . Sexually Active: Not on file   Other Topics Concern  . Not on file   Social History Narrative  . No narrative on file    Review of Systems:  All  systems reviewed.  They are negative to the above problem except as previously stated.  Vital Signs: BP 140/76  Pulse 48  Ht 5\' 11"  (1.803 m)  Wt 241 lb 12.8 oz (109.68 kg)  BMI 33.72 kg/m2  Physical Exam  Patinet is in NAD  HEENT:  Normocephalic, atraumatic. EOMI, PERRLA.  Neck: JVP is normal. No thyromegaly. No bruits.  Lungs: clear to auscultation. No rales no wheezes.  Heart: Regular rate and rhythm. Normal S1, S2. No S3.   No significant murmurs. PMI not displaced.  Abdomen:  Supple, nontender. Normal bowel sounds. No masses. No hepatomegaly.  Extremities:   Good distal pulses throughout. No lower extremity edema.  Musculoskeletal :moving all extremities.  Neuro:   alert and oriented x3.  CN II-XII grossly intact.  EKG:  SB  48     Assessment and Plan:  1.  afib  Remains in SR>  ON Amiodarone.  LV function has normalized Will get labs including INR.  Will review and then decide how to pull back some on regimen.  2.  CM:  Probably tachycardic induced.  LV function has normalized by echo. WIll get labs  Would like to see labs before making changes  3.  Health care maintenance:  Will check Hgb A1c now that more active.  COunselled on tobacco.  Will check lipids.  3.

## 2011-06-20 ENCOUNTER — Telehealth: Payer: Self-pay | Admitting: *Deleted

## 2011-06-20 MED ORDER — AMIODARONE HCL 200 MG PO TABS: 200.0000 mg | ORAL_TABLET | Freq: Every day | ORAL | Status: DC

## 2011-06-20 NOTE — Telephone Encounter (Signed)
Called patient and advised to decrease Amiodarone to 100mg  on Saturday and Sunday and stay on 200mg  other days. Will send script to [facility] for 200mg  every day. Patient aware of dose issue. Made follow up appointment for August 12th.

## 2011-07-07 ENCOUNTER — Ambulatory Visit (INDEPENDENT_AMBULATORY_CARE_PROVIDER_SITE_OTHER): Payer: BC Managed Care – PPO | Admitting: *Deleted

## 2011-07-07 DIAGNOSIS — I4891 Unspecified atrial fibrillation: Secondary | ICD-10-CM

## 2011-07-07 DIAGNOSIS — Z7901 Long term (current) use of anticoagulants: Secondary | ICD-10-CM

## 2011-07-07 LAB — POCT INR: INR: 5.6

## 2011-07-08 MED ORDER — AMIODARONE HCL 200 MG PO TABS: 200.0000 mg | ORAL_TABLET | Freq: Every day | ORAL | Status: DC

## 2011-07-23 ENCOUNTER — Ambulatory Visit (INDEPENDENT_AMBULATORY_CARE_PROVIDER_SITE_OTHER): Payer: BC Managed Care – PPO | Admitting: Pharmacist

## 2011-07-23 DIAGNOSIS — I4891 Unspecified atrial fibrillation: Secondary | ICD-10-CM

## 2011-07-23 DIAGNOSIS — Z7901 Long term (current) use of anticoagulants: Secondary | ICD-10-CM

## 2011-07-23 LAB — POCT INR: INR: 1.6

## 2011-08-06 ENCOUNTER — Ambulatory Visit (INDEPENDENT_AMBULATORY_CARE_PROVIDER_SITE_OTHER): Payer: BC Managed Care – PPO | Admitting: *Deleted

## 2011-08-06 DIAGNOSIS — Z7901 Long term (current) use of anticoagulants: Secondary | ICD-10-CM

## 2011-08-06 DIAGNOSIS — I4891 Unspecified atrial fibrillation: Secondary | ICD-10-CM

## 2011-08-20 ENCOUNTER — Ambulatory Visit (INDEPENDENT_AMBULATORY_CARE_PROVIDER_SITE_OTHER): Payer: BC Managed Care – PPO | Admitting: *Deleted

## 2011-08-20 DIAGNOSIS — Z7901 Long term (current) use of anticoagulants: Secondary | ICD-10-CM

## 2011-08-20 DIAGNOSIS — I4891 Unspecified atrial fibrillation: Secondary | ICD-10-CM

## 2011-08-31 ENCOUNTER — Other Ambulatory Visit: Payer: Self-pay | Admitting: Internal Medicine

## 2011-09-10 ENCOUNTER — Ambulatory Visit (INDEPENDENT_AMBULATORY_CARE_PROVIDER_SITE_OTHER): Payer: BC Managed Care – PPO | Admitting: *Deleted

## 2011-09-10 DIAGNOSIS — Z7901 Long term (current) use of anticoagulants: Secondary | ICD-10-CM

## 2011-09-10 DIAGNOSIS — I4891 Unspecified atrial fibrillation: Secondary | ICD-10-CM

## 2011-09-18 ENCOUNTER — Other Ambulatory Visit: Payer: Self-pay

## 2011-09-18 MED ORDER — LISINOPRIL 5 MG PO TABS: 5.0000 mg | ORAL_TABLET | Freq: Every day | ORAL | Status: DC

## 2011-10-06 ENCOUNTER — Ambulatory Visit (INDEPENDENT_AMBULATORY_CARE_PROVIDER_SITE_OTHER): Payer: BC Managed Care – PPO | Admitting: Internal Medicine

## 2011-10-06 ENCOUNTER — Encounter: Payer: Self-pay | Admitting: Internal Medicine

## 2011-10-06 ENCOUNTER — Ambulatory Visit (INDEPENDENT_AMBULATORY_CARE_PROVIDER_SITE_OTHER): Payer: BC Managed Care – PPO | Admitting: Pharmacist

## 2011-10-06 VITALS — BP 139/74 | HR 59 | Ht 71.0 in | Wt 260.0 lb

## 2011-10-06 DIAGNOSIS — I4891 Unspecified atrial fibrillation: Secondary | ICD-10-CM

## 2011-10-06 DIAGNOSIS — Z7901 Long term (current) use of anticoagulants: Secondary | ICD-10-CM

## 2011-10-06 NOTE — Progress Notes (Signed)
HPI Patient is a 51 year old with a history of afib and CHF.  I saw him in April.  He underwent cardioversion earlier this year.  On amio and coumadin.  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 ALso history of dyslipidemia and tob use  LDL in April was 87, HDL 43 Since seen he has done ok  He notes only one day when it was hot that he felt funny.    No Known Allergies  Current Outpatient Prescriptions  Medication Sig Dispense Refill  . amiodarone (PACERONE) 200 MG tablet Take 1 tablet (200 mg total) by mouth daily. Take 200 mg po daily except for Saturday and Sunday take 100 mg po per phone message/call to patient from nurse  30 tablet  6  . furosemide (LASIX) 40 MG tablet Take 40 mg by mouth 2 (two) times daily.        Marland Kitchen lisinopril (PRINIVIL,ZESTRIL) 5 MG tablet Take 1 tablet (5 mg total) by mouth daily.  30 tablet  5  . metoprolol (LOPRESSOR) 50 MG tablet Take 50 mg by mouth 2 (two) times daily.        Marland Kitchen warfarin (COUMADIN) 5 MG tablet TAKE 1 TABLET BY MOUTH EVERY DAY AS DIRECTED (UP TO 10 MG DAILY )  45 tablet  3  . DISCONTD: furosemide (LASIX) 40 MG tablet Take 1 tablet (40 mg total) by mouth 2 (two) times daily.  60 tablet  6  . DISCONTD: lisinopril (PRINIVIL,ZESTRIL) 5 MG tablet Take 1 tablet (5 mg total) by mouth daily.  30 tablet  6  . DISCONTD: metoprolol tartrate (LOPRESSOR) 50 MG tablet Take 1 tablet (50 mg total) by mouth 2 (two) times daily.  60 tablet  6   Current Facility-Administered Medications  Medication Dose Route Frequency Provider Last Rate Last Dose  . DISCONTD: hydrocortisone cream 1 % 1 application  1 application Topical TID PRN Beatrice Lecher, PA        Past Medical History  Diagnosis Date  . DM2 (diabetes mellitus, type 2)     Hemoglobin A1c 7.1 in 11/12  . Alcohol abuse   . Tobacco abuse   . Atrial fibrillation     s/p TEE-DCCV 11/12;  amiodarone started 02/03/11  . Chronic systolic heart failure   . Arthritis     left elbow  . Gout     . Cardiomyopathy     In the setting of afib with rvr 11/12;  Echo 01/19/11:  Diffuse HK worse in Inf wall and septum, mod LVE, mild LVH, EF 30%, mild BAE    Past Surgical History  Procedure Date  . Cardioversion 01/20/2011    Procedure: CARDIOVERSION;  Surgeon: Pricilla Riffle, MD;  Location: [facility] OR;  Service: Cardiovascular;  Laterality: N/A;  . Tee without cardioversion 01/20/2011    Procedure: TRANSESOPHAGEAL ECHOCARDIOGRAM (TEE);  Surgeon: Pricilla Riffle, MD;  Location: [facility] ENDOSCOPY;  Service: Cardiovascular;  Laterality: N/A;  . Cardioversion 01/20/2011    Procedure: CARDIOVERSION;  Surgeon: Pricilla Riffle, MD;  Location: [facility] ENDOSCOPY;  Service: Cardiovascular;  Laterality: N/A;  . Cardioversion 02/27/2011    Procedure: CARDIOVERSION;  Surgeon: Lewayne Bunting, MD;  Location: [facility] OR;  Service: Cardiovascular;  Laterality: N/A;    Family History  Problem Relation Age of Onset  . Diabetes Father     Multiple relatives in his family as well  . Peripheral vascular disease Mother     Died from complications from an  aortic surgery  . Anesthesia problems Neg Hx   . Hypotension Neg Hx   . Malignant hyperthermia Neg Hx   . Pseudochol deficiency Neg Hx     History   Social History  . Marital Status: Single    Spouse Name: N/A    Number of Children: N/A  . 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 Smoking status: Current Everyday Smoker -- 2.0 packs/day for 35 years    Types: Cigarettes  . Smokeless tobacco: Not on file  . Alcohol Use: 4.8 oz/week    8 Cans of beer per week     In winter, 4 beers 1-2x/week. In summer, >6 beers daily.  . Drug Use: No  . Sexually Active: Not on file   Other Topics Concern  . Not on file   Social History Narrative  . No narrative on file    Review of Systems:  All systems reviewed.  They are negative to the above problem  except as previously stated.  Vital Signs: BP 139/74  Pulse 59  Ht 5\' 11"  (1.803 m)  Wt 260 lb (117.935 kg)  BMI 36.26 kg/m2  Physical Exam Patient is in NAD HEENT:  Normocephalic, atraumatic. EOMI, PERRLA.  Neck: JVP is normal.  Lungs: cear to auscultation. No rales no wheezes.  Heart: Regular rate and rhythm. Normal S1, S2. No S3.   No significant murmurs. PMI not displaced.  Abdomen:  Supple, nontender. Normal bowel sounds. No masses. No hepatomegaly.  Extremities:   Good distal pulses throughout. No lower extremity edema.  Musculoskeletal :moving all extremities.  Neuro:   alert and oriented x3.  CN II-XII grossly intact.  Impr:  SB  59 bpm. Assessment and Plan:  1.  AFib  C ontinue on coumadin and amio.  Will discuss taper  Discussed with EP.  Plan current regimen of Amio for 6 months total.  Recommend until end of October.  Then go to 200 4x per wk; 100 3x per wk for 6 months. 2.  CM  LV function has improved.   3.  Tob  Still smoking 1 1/2 ppd  Will try ecig   4.  Lipids  Good

## 2011-10-06 NOTE — Patient Instructions (Signed)
Your physician recommends that you return for lab work on 10/22/11: CBC and BMP  Your physician wants you to follow-up in: 6 MONTHS with Dr Tenny Craw.  You will receive a reminder letter in the mail two months in advance. If you don't receive a letter, please call our office to schedule the follow-up appointment.  Your physician recommends that you continue on your current medications as directed. Please refer to the Current Medication list given to you today.

## 2011-10-22 ENCOUNTER — Other Ambulatory Visit (INDEPENDENT_AMBULATORY_CARE_PROVIDER_SITE_OTHER): Payer: BC Managed Care – PPO

## 2011-10-22 ENCOUNTER — Ambulatory Visit (INDEPENDENT_AMBULATORY_CARE_PROVIDER_SITE_OTHER): Payer: BC Managed Care – PPO | Admitting: Pharmacist

## 2011-10-22 DIAGNOSIS — I4891 Unspecified atrial fibrillation: Secondary | ICD-10-CM

## 2011-10-22 DIAGNOSIS — Z7901 Long term (current) use of anticoagulants: Secondary | ICD-10-CM

## 2011-10-23 LAB — BASIC METABOLIC PANEL WITH GFR
BUN: 15 mg/dL (ref 6–23)
CO2: 25 meq/L (ref 19–32)
Calcium: 9.4 mg/dL (ref 8.4–10.5)
Chloride: 104 meq/L (ref 96–112)
Creatinine, Ser: 1.2 mg/dL (ref 0.4–1.5)
GFR: 70.59 mL/min (ref >60.00)
Glucose, Bld: 103 mg/dL — ABNORMAL HIGH (ref 70–99)
Potassium: 4.5 meq/L (ref 3.5–5.1)
Sodium: 137 meq/L (ref 135–145)

## 2011-10-23 LAB — CBC WITH DIFFERENTIAL/PLATELET
Basophils Absolute: 0.2 10*3/uL — ABNORMAL HIGH (ref 0.0–0.1)
Eosinophils Absolute: 0.2 10*3/uL (ref 0.0–0.7)
Lymphocytes Relative: 28.2 % (ref 12.0–46.0)
Lymphs Abs: 2.2 10*3/uL (ref 0.7–4.0)
MCHC: 33.3 g/dL (ref 30.0–36.0)
Monocytes Relative: 9.9 % (ref 3.0–12.0)
Neutro Abs: 4.5 10*3/uL (ref 1.4–7.7)
Platelets: 227 10*3/uL (ref 150.0–400.0)
RDW: 14.9 % — ABNORMAL HIGH (ref 11.5–14.6)

## 2011-10-24 ENCOUNTER — Other Ambulatory Visit: Payer: Self-pay

## 2011-10-24 MED ORDER — METOPROLOL TARTRATE 50 MG PO TABS: 50.0000 mg | ORAL_TABLET | Freq: Two times a day (BID) | ORAL | Status: DC

## 2011-11-05 ENCOUNTER — Ambulatory Visit (INDEPENDENT_AMBULATORY_CARE_PROVIDER_SITE_OTHER): Payer: BC Managed Care – PPO | Admitting: *Deleted

## 2011-11-05 DIAGNOSIS — I4891 Unspecified atrial fibrillation: Secondary | ICD-10-CM

## 2011-11-05 DIAGNOSIS — Z7901 Long term (current) use of anticoagulants: Secondary | ICD-10-CM

## 2011-11-05 LAB — POCT INR: INR: 2.3

## 2012-01-06 ENCOUNTER — Other Ambulatory Visit: Payer: Self-pay | Admitting: *Deleted

## 2012-01-06 MED ORDER — FUROSEMIDE 40 MG PO TABS: 40.0000 mg | ORAL_TABLET | Freq: Two times a day (BID) | ORAL | Status: DC

## 2012-02-22 ENCOUNTER — Other Ambulatory Visit: Payer: Self-pay | Admitting: Internal Medicine

## 2012-02-23 NOTE — Telephone Encounter (Signed)
Pt states he ran out of Coumadin and stopped taking it on his own for several months and that is why he has not f/u in clinic.  Pt wishes to resume Coumadin, needs rx refill.  Pt is aware he must keep f/u appts while on Coumadin.  Advised pt I would send in a 10 day supply and made f/u appt for 03/04/11 at pt's request.  Pt is going to resume Coumadin at his previous dosage.

## 2012-03-03 ENCOUNTER — Ambulatory Visit (INDEPENDENT_AMBULATORY_CARE_PROVIDER_SITE_OTHER): Payer: BC Managed Care – PPO | Admitting: *Deleted

## 2012-03-03 DIAGNOSIS — I4891 Unspecified atrial fibrillation: Secondary | ICD-10-CM

## 2012-03-03 DIAGNOSIS — Z7901 Long term (current) use of anticoagulants: Secondary | ICD-10-CM

## 2012-03-03 MED ORDER — WARFARIN SODIUM 5 MG PO TABS: 5.0000 mg | ORAL_TABLET | ORAL | Status: DC

## 2012-03-17 ENCOUNTER — Ambulatory Visit (INDEPENDENT_AMBULATORY_CARE_PROVIDER_SITE_OTHER): Payer: BC Managed Care – PPO | Admitting: *Deleted

## 2012-03-17 DIAGNOSIS — I4891 Unspecified atrial fibrillation: Secondary | ICD-10-CM

## 2012-03-17 DIAGNOSIS — Z7901 Long term (current) use of anticoagulants: Secondary | ICD-10-CM

## 2012-03-17 LAB — POCT INR: INR: 2.4

## 2012-04-05 ENCOUNTER — Ambulatory Visit (INDEPENDENT_AMBULATORY_CARE_PROVIDER_SITE_OTHER): Payer: BC Managed Care – PPO | Admitting: Pharmacist

## 2012-04-05 DIAGNOSIS — I4891 Unspecified atrial fibrillation: Secondary | ICD-10-CM

## 2012-04-05 DIAGNOSIS — Z7901 Long term (current) use of anticoagulants: Secondary | ICD-10-CM

## 2012-04-05 LAB — POCT INR: INR: 2.4

## 2012-04-06 ENCOUNTER — Other Ambulatory Visit: Payer: Self-pay | Admitting: Internal Medicine

## 2012-04-22 ENCOUNTER — Encounter: Payer: Self-pay | Admitting: Internal Medicine

## 2012-04-22 ENCOUNTER — Ambulatory Visit (INDEPENDENT_AMBULATORY_CARE_PROVIDER_SITE_OTHER): Payer: BC Managed Care – PPO | Admitting: Internal Medicine

## 2012-04-22 VITALS — BP 152/77 | HR 84 | Ht 71.0 in | Wt 287.0 lb

## 2012-04-22 MED ORDER — LISINOPRIL 10 MG PO TABS: 10.0000 mg | ORAL_TABLET | Freq: Every day | ORAL | Status: DC

## 2012-04-22 NOTE — Addendum Note (Signed)
Addended by: Pricilla Riffle on: 04/22/2012 08:14 PM   Modules accepted: Level of Service

## 2012-04-22 NOTE — Progress Notes (Addendum)
HPI Patient is a 52 year old with a history of afib and CHF. I saw him in April. He underwent cardioversion early 2013 . On amio and coumadin. LVEF has normalized LV dysfunction felt to be tachy induced.  ALso history of dyslipidemia and tob use  I saw him in AUg 2013.  In October he decreased amio to 200 5x per wk and 100 2x per wk SInce seen he denies palpitations.  Breathing is stable  He is still smoking but is cutting back. No CP  No Known Allergies  Current Outpatient Prescriptions  Medication Sig Dispense Refill  . amiodarone (PACERONE) 200 MG tablet Take 1 tablet (200 mg total) by mouth daily. Take 200 mg po daily except for Saturday and Sunday take 100 mg po per phone message/call to patient from nurse  30 tablet  6  . furosemide (LASIX) 40 MG tablet Take 1 tablet (40 mg total) by mouth 2 (two) times daily.  30 tablet  6  . lisinopril (PRINIVIL,ZESTRIL) 5 MG tablet Take 1 tablet (5 mg total) by mouth daily.  30 tablet  5  . metoprolol (LOPRESSOR) 50 MG tablet Take 1 tablet (50 mg total) by mouth 2 (two) times daily.  60 tablet  5  . warfarin (COUMADIN) 5 MG tablet Take as directed by coumadin clinic  40 tablet  3   No current facility-administered medications for this visit.    Past Medical History  Diagnosis Date  . DM2 (diabetes mellitus, type 2)     Hemoglobin A1c 7.1 in 11/12  . Alcohol abuse   . Tobacco abuse   . Atrial fibrillation     s/p TEE-DCCV 11/12;  amiodarone started 02/03/11  . Chronic systolic heart failure   . Arthritis     left elbow  . Gout   . Cardiomyopathy     In the setting of afib with rvr 11/12;  Echo 01/19/11:  Diffuse HK worse in Inf wall and septum, mod LVE, mild LVH, EF 30%, mild BAE    Past Surgical History  Procedure Laterality Date  . Cardioversion  01/20/2011    Procedure: CARDIOVERSION;  Surgeon: Pricilla Riffle, MD;  Location: [facility] OR;  Service: Cardiovascular;  Laterality: N/A;  . Tee without cardioversion  01/20/2011    Procedure:  TRANSESOPHAGEAL ECHOCARDIOGRAM (TEE);  Surgeon: Pricilla Riffle, MD;  Location: [facility] ENDOSCOPY;  Service: Cardiovascular;  Laterality: N/A;  . Cardioversion  01/20/2011    Procedure: CARDIOVERSION;  Surgeon: Pricilla Riffle, MD;  Location: [facility] ENDOSCOPY;  Service: Cardiovascular;  Laterality: N/A;  . Cardioversion  02/27/2011    Procedure: CARDIOVERSION;  Surgeon: Lewayne Bunting, MD;  Location: [facility] OR;  Service: Cardiovascular;  Laterality: N/A;    Family History  Problem Relation Age of Onset  . Diabetes Father     Multiple relatives in his family as well  . Peripheral vascular disease Mother     Died from complications from an aortic surgery  . Anesthesia problems Neg Hx   . Hypotension Neg Hx   . Malignant hyperthermia Neg Hx   . Pseudochol deficiency Neg Hx     History   Social History  . Marital Status: Single    Spouse Name: N/A    Number of Children: N/A  . 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 Smoking status: Current Every Day  Smoker -- 2.00 packs/day for 35 years    Types: Cigarettes  . Smokeless tobacco: Not on file  . Alcohol Use: 4.8 oz/week    8 Cans of beer per week     Comment: In winter, 4 beers 1-2x/week. In summer, >6 beers daily.  . Drug Use: No  . Sexually Active: Not on file   Other Topics Concern  . Not on file   Social History Narrative  . No narrative on file    Review of Systems:  All systems reviewed.  They are negative to the above problem except as previously stated.  Vital Signs: BP 152/77  Pulse 84  Ht 5\' 11"  (1.803 m)  Wt 287 lb (130.182 kg)  BMI 40.05 kg/m2  Physical Exam Patient is a morbidly obese 52 yo in NAD HEENT:  Normocephalic, atraumatic. EOMI, PERRLA.  Neck: JVP is normal.  No bruits.  Lungs: clear to auscultation. No rales no wheezes.  Heart: Regular rate and rhythm. Normal S1, S2. No S3.   No  significant murmurs. PMI not displaced.  Abdomen:  Supple, nontender. Normal bowel sounds. No masses. No hepatomegaly.  Extremities:   Good distal pulses throughout. No lower extremity edema.  Musculoskeletal :moving all extremities.  Neuro:   alert and oriented x3.  CN II-XII grossly intact.  EKG  SR 84  Qtc 472 Assessment and Plan:  1.  Afib  Maintaining SR.  Will chck labs  Prob decrease amio further to 200 qd x 4 days per wk and 100 for 3 days per wk.  Continue for 6 months  2.  HL  Need to follow  Good Is not on statin  3.  Tob  Counselled again on cessation  4.  Obesity.  Counselled on wt loss.    5.  HTN  BP is up  Will increase lisinopril  To 10 mg  F/U BP in coumadin clinic in 2 wks.  Check BMET in 2 wks.

## 2012-04-22 NOTE — Patient Instructions (Addendum)
Increase Lisinopril to 10mg  daily.  Your physician recommends that you return for lab work on 05/05/2012.  Lipids, CMET, CBC & TSH  Your physician wants you to follow-up in September with Dr. Tenny Craw. You will receive a reminder letter in the mail two months in advance. If you don't receive a letter, please call our office to schedule the follow-up appointment.

## 2012-04-27 ENCOUNTER — Telehealth: Payer: Self-pay | Admitting: *Deleted

## 2012-04-27 ENCOUNTER — Other Ambulatory Visit: Payer: Self-pay | Admitting: *Deleted

## 2012-04-27 MED ORDER — AMIODARONE HCL 200 MG PO TABS: 200.0000 mg | ORAL_TABLET | Freq: Every day | ORAL | Status: DC

## 2012-04-27 NOTE — Telephone Encounter (Signed)
Advised pt, per Dr. Tenny Craw, to Take Amiodarone 200mg  Mon/Tues/Wed/Thur and 100mg  Fri/Sat/Sun.  Pt agreed.

## 2012-05-05 ENCOUNTER — Other Ambulatory Visit (INDEPENDENT_AMBULATORY_CARE_PROVIDER_SITE_OTHER): Payer: BC Managed Care – PPO

## 2012-05-05 ENCOUNTER — Telehealth: Payer: Self-pay | Admitting: *Deleted

## 2012-05-05 ENCOUNTER — Ambulatory Visit (INDEPENDENT_AMBULATORY_CARE_PROVIDER_SITE_OTHER): Payer: BC Managed Care – PPO | Admitting: *Deleted

## 2012-05-05 NOTE — Telephone Encounter (Signed)
As request we checked B/P 138/72

## 2012-05-06 LAB — CBC WITH DIFFERENTIAL/PLATELET
Basophils Absolute: 0.1 10*3/uL (ref 0.0–0.1)
Lymphocytes Relative: 23.5 % (ref 12.0–46.0)
Monocytes Relative: 12.1 % — ABNORMAL HIGH (ref 3.0–12.0)
Neutrophils Relative %: 60 % (ref 43.0–77.0)
Platelets: 238 10*3/uL (ref 150.0–400.0)
RDW: 13 % (ref 11.5–14.6)
WBC: 6.3 10*3/uL (ref 4.5–10.5)

## 2012-05-06 LAB — COMPREHENSIVE METABOLIC PANEL
ALT: 22 U/L (ref 0–53)
Albumin: 3.6 g/dL (ref 3.5–5.2)
CO2: 25 mEq/L (ref 19–32)
Calcium: 8.9 mg/dL (ref 8.4–10.5)
Chloride: 103 mEq/L (ref 96–112)
GFR: 71.16 mL/min (ref >60.00)
Potassium: 4.2 mEq/L (ref 3.5–5.1)
Sodium: 135 mEq/L (ref 135–145)
Total Bilirubin: 0.7 mg/dL (ref 0.3–1.2)
Total Protein: 7.2 g/dL (ref 6.0–8.3)

## 2012-05-06 LAB — LIPID PANEL
HDL: 42.1 mg/dL (ref >39.00)
LDL Cholesterol: 80 mg/dL (ref 0–99)
Total CHOL/HDL Ratio: 4
Triglycerides: 132 mg/dL (ref 0.0–149.0)

## 2012-05-07 NOTE — Telephone Encounter (Signed)
BP is better  Keep on same meds

## 2012-05-13 ENCOUNTER — Other Ambulatory Visit: Payer: Self-pay | Admitting: Internal Medicine

## 2012-06-23 ENCOUNTER — Ambulatory Visit (INDEPENDENT_AMBULATORY_CARE_PROVIDER_SITE_OTHER): Payer: BC Managed Care – PPO | Admitting: *Deleted

## 2012-06-23 DIAGNOSIS — I4891 Unspecified atrial fibrillation: Secondary | ICD-10-CM

## 2012-06-23 DIAGNOSIS — Z7901 Long term (current) use of anticoagulants: Secondary | ICD-10-CM

## 2012-07-23 ENCOUNTER — Other Ambulatory Visit: Payer: Self-pay | Admitting: Emergency Medicine

## 2012-07-23 MED ORDER — WARFARIN SODIUM 5 MG PO TABS: ORAL_TABLET | ORAL | Status: DC

## 2012-08-05 ENCOUNTER — Ambulatory Visit (INDEPENDENT_AMBULATORY_CARE_PROVIDER_SITE_OTHER): Payer: BC Managed Care – PPO

## 2012-08-05 DIAGNOSIS — I4891 Unspecified atrial fibrillation: Secondary | ICD-10-CM

## 2012-08-05 DIAGNOSIS — Z7901 Long term (current) use of anticoagulants: Secondary | ICD-10-CM

## 2012-09-01 ENCOUNTER — Ambulatory Visit (INDEPENDENT_AMBULATORY_CARE_PROVIDER_SITE_OTHER): Payer: BC Managed Care – PPO | Admitting: *Deleted

## 2012-09-01 DIAGNOSIS — Z7901 Long term (current) use of anticoagulants: Secondary | ICD-10-CM

## 2012-09-01 DIAGNOSIS — I4891 Unspecified atrial fibrillation: Secondary | ICD-10-CM

## 2012-09-01 LAB — POCT INR: INR: 2.4

## 2012-09-23 ENCOUNTER — Other Ambulatory Visit: Payer: Self-pay | Admitting: Internal Medicine

## 2012-09-23 NOTE — Telephone Encounter (Signed)
Please refill this 

## 2012-10-05 ENCOUNTER — Ambulatory Visit (INDEPENDENT_AMBULATORY_CARE_PROVIDER_SITE_OTHER): Payer: BC Managed Care – PPO | Admitting: Pharmacist

## 2012-10-05 DIAGNOSIS — Z7901 Long term (current) use of anticoagulants: Secondary | ICD-10-CM

## 2012-10-05 DIAGNOSIS — I4891 Unspecified atrial fibrillation: Secondary | ICD-10-CM

## 2012-10-05 LAB — POCT INR: INR: 1.1

## 2012-10-12 ENCOUNTER — Ambulatory Visit (INDEPENDENT_AMBULATORY_CARE_PROVIDER_SITE_OTHER): Payer: BC Managed Care – PPO | Admitting: *Deleted

## 2012-10-12 DIAGNOSIS — I4891 Unspecified atrial fibrillation: Secondary | ICD-10-CM

## 2012-10-12 DIAGNOSIS — Z7901 Long term (current) use of anticoagulants: Secondary | ICD-10-CM

## 2012-10-12 LAB — POCT INR: INR: 1.7

## 2012-12-24 ENCOUNTER — Ambulatory Visit (INDEPENDENT_AMBULATORY_CARE_PROVIDER_SITE_OTHER): Payer: BC Managed Care – PPO | Admitting: General Practice

## 2012-12-24 DIAGNOSIS — Z7901 Long term (current) use of anticoagulants: Secondary | ICD-10-CM

## 2012-12-24 DIAGNOSIS — I4891 Unspecified atrial fibrillation: Secondary | ICD-10-CM

## 2012-12-24 LAB — POCT INR: INR: 1.6

## 2013-01-10 ENCOUNTER — Encounter: Payer: Self-pay | Admitting: Internal Medicine

## 2013-01-10 ENCOUNTER — Ambulatory Visit (INDEPENDENT_AMBULATORY_CARE_PROVIDER_SITE_OTHER): Payer: BC Managed Care – PPO | Admitting: Internal Medicine

## 2013-01-10 ENCOUNTER — Ambulatory Visit (INDEPENDENT_AMBULATORY_CARE_PROVIDER_SITE_OTHER): Payer: BC Managed Care – PPO | Admitting: *Deleted

## 2013-01-10 VITALS — BP 130/78 | HR 86 | Ht 71.0 in | Wt 280.0 lb

## 2013-01-10 DIAGNOSIS — Z7901 Long term (current) use of anticoagulants: Secondary | ICD-10-CM

## 2013-01-10 DIAGNOSIS — I4891 Unspecified atrial fibrillation: Secondary | ICD-10-CM

## 2013-01-10 DIAGNOSIS — C3490 Malignant neoplasm of unspecified part of unspecified bronchus or lung: Secondary | ICD-10-CM

## 2013-01-10 DIAGNOSIS — I5022 Chronic systolic (congestive) heart failure: Secondary | ICD-10-CM

## 2013-01-10 DIAGNOSIS — I10 Essential (primary) hypertension: Secondary | ICD-10-CM

## 2013-01-10 LAB — POCT INR: INR: 2.3

## 2013-01-10 NOTE — Progress Notes (Signed)
HPI Patient is a 52 year old with a history of afib and CHF. I saw him in Feb.   He underwent cardioversion early 2013 . On amio and coumadin. LVEF has normalized LV dysfunction felt to be tachy induced.  On last visit i decrease amio to 200 qd except 100 on F/S/S ALso history of dyslipidemia and tob use   Since I saw him he thinks he had 1 episdoe of afib Occurred halloween weekend  Camping  Felt SOB  Felt HR irregular  Lasted less than 24 hours  Woke up next am and gone Did have a little LE edema that has improved. 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  He is being treated at [facility] (Dr Julio Alm, oncologist)  Plan for chemo and then XRT to brain.   No Known Allergies  Current Outpatient Prescriptions  Medication Sig Dispense Refill  . amiodarone (PACERONE) 200 MG tablet Take 1 tablet (200 mg total) by mouth as directed. 1 TAB MON-THUR. ...1/2 TAB ON FRI, SAT, AND SUN  30 tablet  11  . folic acid (FOLVITE) 1 MG tablet Take by mouth daily.      Marland Kitchen lisinopril (PRINIVIL,ZESTRIL) 10 MG tablet Take 1 tablet (10 mg total) by mouth daily.  90 tablet  3  . warfarin (COUMADIN) 5 MG tablet TAKE AS DIRECTED BY COUMADIN CLINIC  40 tablet  3   No current facility-administered medications for this visit.    Past Medical History  Diagnosis Date  . DM2 (diabetes mellitus, type 2)     Hemoglobin A1c 7.1 in 11/12  . Alcohol abuse   . Tobacco abuse   . Atrial fibrillation     s/p TEE-DCCV 11/12;  amiodarone started 02/03/11  . Chronic systolic heart failure   . Arthritis     left elbow  . Gout   . Cardiomyopathy     In the setting of afib with rvr 11/12;  Echo 01/19/11:  Diffuse HK worse in Inf wall and septum, mod LVE, mild LVH, EF 30%, mild BAE    Past Surgical History  Procedure Laterality Date  . Cardioversion  01/20/2011    Procedure: CARDIOVERSION;  Surgeon: Pricilla Riffle, MD;  Location: [facility] OR;  Service:  Cardiovascular;  Laterality: N/A;  . Tee without cardioversion  01/20/2011    Procedure: TRANSESOPHAGEAL ECHOCARDIOGRAM (TEE);  Surgeon: Pricilla Riffle, MD;  Location: [facility] ENDOSCOPY;  Service: Cardiovascular;  Laterality: N/A;  . Cardioversion  01/20/2011    Procedure: CARDIOVERSION;  Surgeon: Pricilla Riffle, MD;  Location: [facility] ENDOSCOPY;  Service: Cardiovascular;  Laterality: N/A;  . Cardioversion  02/27/2011    Procedure: CARDIOVERSION;  Surgeon: Lewayne Bunting, MD;  Location: [facility] OR;  Service: Cardiovascular;  Laterality: N/A;    Family History  Problem Relation Age of Onset  . Diabetes Father     Multiple relatives in his family as well  . Peripheral vascular disease Mother     Died from complications from an aortic surgery  . Anesthesia problems Neg Hx   . Hypotension Neg Hx   . Malignant hyperthermia Neg Hx   . Pseudochol deficiency Neg Hx     History   Social History  . Marital Status: Single    Spouse Name: N/A    Number of Children: N/A  . Years of Education: N/A   Occupational History  . Service technician  Working with [employer]   Social History Main Topics  . Smoking status: Current Every Day Smoker -- 2.00 packs/day for 35 years    Types: Cigarettes  . Smokeless tobacco: Not on file  . Alcohol Use: 4.8 oz/week    8 Cans of beer per week     Comment: In winter, 4 beers 1-2x/week. In summer, >6 beers daily.  . Drug Use: No  . Sexual Activity: Not on file   Other Topics Concern  . Not on file   Social History Narrative  . No narrative on file    Review of Systems:  All systems reviewed.  They are negative to the above problem except as previously stated.  Vital Signs: BP 130/78  Pulse 86  Ht 5\' 11"  (1.803 m)  Wt 280 lb (127.007 kg)  BMI 39.07 kg/m2  Physical Exam Patient is a morbidly obese 52 yo in NAD HEENT:  Normocephalic, atraumatic. EOMI, PERRLA.  Neck: JVP is normal.  No bruits.  Lungs: clear to  auscultation. No rales no wheezes.  Heart: Regular rate and rhythm. Normal S1, S2. No S3.   No significant murmurs. PMI not displaced.  Abdomen:  Supple, nontender. Normal bowel sounds. No masses. No hepatomegaly.  Extremities:   Good distal pulses throughout. Tr lower extremity edema.  Musculoskeletal :moving all extremities.  Neuro:   alert and oriented x3.  CN II-XII grossly intact.  EKG  SR 90 Assessment and Plan:  1.  Afib  It sounds like he had one spell a couple wks ago  Agree witn increasing amio to 200 mg per day.  On coumadin with hx DM and CHF history.  WIll need to follow use of anticoag as he progresses in treatment of lung CA  2. .  HTN  Bp is good  Keep on same medicines.    3.  Lung Ca  Patient with metastatic dz  Followed at [facility].  Will try to follow clinic Rx and labs.

## 2013-01-10 NOTE — Patient Instructions (Signed)
Your physician recommends that you continue on your current medications as directed. Please refer to the Current Medication list given to you today.  Your physician wants you to follow-up in: 4 months with Dr. Ross.  You will receive a reminder letter in the mail two months in advance. If you don't receive a letter, please call our office to schedule the follow-up appointment.   

## 2013-01-31 ENCOUNTER — Ambulatory Visit (INDEPENDENT_AMBULATORY_CARE_PROVIDER_SITE_OTHER): Payer: BC Managed Care – PPO | Admitting: *Deleted

## 2013-01-31 DIAGNOSIS — I4891 Unspecified atrial fibrillation: Secondary | ICD-10-CM

## 2013-01-31 DIAGNOSIS — Z7901 Long term (current) use of anticoagulants: Secondary | ICD-10-CM

## 2013-01-31 LAB — POCT INR: INR: 1.4

## 2013-02-02 ENCOUNTER — Other Ambulatory Visit: Payer: Self-pay | Admitting: Internal Medicine

## 2013-02-21 ENCOUNTER — Ambulatory Visit (INDEPENDENT_AMBULATORY_CARE_PROVIDER_SITE_OTHER): Payer: BC Managed Care – PPO | Admitting: Pharmacist

## 2013-02-21 DIAGNOSIS — Z7901 Long term (current) use of anticoagulants: Secondary | ICD-10-CM

## 2013-02-21 DIAGNOSIS — I4891 Unspecified atrial fibrillation: Secondary | ICD-10-CM

## 2013-03-07 ENCOUNTER — Ambulatory Visit (INDEPENDENT_AMBULATORY_CARE_PROVIDER_SITE_OTHER): Payer: BC Managed Care – PPO | Admitting: Pharmacist

## 2013-03-07 DIAGNOSIS — I4891 Unspecified atrial fibrillation: Secondary | ICD-10-CM

## 2013-03-07 DIAGNOSIS — Z7901 Long term (current) use of anticoagulants: Secondary | ICD-10-CM

## 2013-03-07 LAB — POCT INR: INR: 2.6

## 2013-03-28 ENCOUNTER — Ambulatory Visit (INDEPENDENT_AMBULATORY_CARE_PROVIDER_SITE_OTHER): Payer: BC Managed Care – PPO

## 2013-03-28 DIAGNOSIS — I4891 Unspecified atrial fibrillation: Secondary | ICD-10-CM

## 2013-03-28 DIAGNOSIS — Z7901 Long term (current) use of anticoagulants: Secondary | ICD-10-CM

## 2013-03-28 LAB — POCT INR: INR: 3.3

## 2013-05-20 ENCOUNTER — Other Ambulatory Visit: Payer: Self-pay | Admitting: Internal Medicine

## 2013-06-06 ENCOUNTER — Ambulatory Visit (INDEPENDENT_AMBULATORY_CARE_PROVIDER_SITE_OTHER): Payer: BC Managed Care – PPO | Admitting: Internal Medicine

## 2013-06-06 ENCOUNTER — Ambulatory Visit (INDEPENDENT_AMBULATORY_CARE_PROVIDER_SITE_OTHER): Payer: BC Managed Care – PPO

## 2013-06-06 ENCOUNTER — Encounter: Payer: Self-pay | Admitting: Internal Medicine

## 2013-06-06 VITALS — BP 132/68 | HR 88 | Ht 71.0 in | Wt 278.0 lb

## 2013-06-06 DIAGNOSIS — Z7901 Long term (current) use of anticoagulants: Secondary | ICD-10-CM

## 2013-06-06 DIAGNOSIS — I4891 Unspecified atrial fibrillation: Secondary | ICD-10-CM

## 2013-06-06 DIAGNOSIS — Z79899 Other long term (current) drug therapy: Secondary | ICD-10-CM

## 2013-06-06 LAB — COMPREHENSIVE METABOLIC PANEL
ALT: 19 U/L (ref 0–53)
AST: 20 U/L (ref 0–37)
Albumin: 3.6 g/dL (ref 3.5–5.2)
Alkaline Phosphatase: 79 U/L (ref 39–117)
BUN: 14 mg/dL (ref 6–23)
CALCIUM: 9.4 mg/dL (ref 8.4–10.5)
CO2: 26 meq/L (ref 19–32)
CREATININE: 1.1 mg/dL (ref 0.4–1.5)
Chloride: 100 mEq/L (ref 96–112)
GFR: 73.86 mL/min (ref >60.00)
Glucose, Bld: 123 mg/dL — ABNORMAL HIGH (ref 70–99)
Potassium: 4.3 mEq/L (ref 3.5–5.1)
Sodium: 135 mEq/L (ref 135–145)
Total Bilirubin: 0.5 mg/dL (ref 0.3–1.2)
Total Protein: 7.2 g/dL (ref 6.0–8.3)

## 2013-06-06 LAB — CBC
HCT: 35.7 % — ABNORMAL LOW (ref 39.0–52.0)
Hemoglobin: 12.2 g/dL — ABNORMAL LOW (ref 13.0–17.0)
MCHC: 34.2 g/dL (ref 30.0–36.0)
MCV: 100 fl (ref 78.0–100.0)
Platelets: 221 10*3/uL (ref 150.0–400.0)
RBC: 3.57 Mil/uL — AB (ref 4.22–5.81)
RDW: 13.3 % (ref 11.5–14.6)
WBC: 4.6 10*3/uL (ref 4.5–10.5)

## 2013-06-06 LAB — POCT INR: INR: 1.7

## 2013-06-06 LAB — TSH: TSH: 0.93 u[IU]/mL (ref 0.35–5.50)

## 2013-06-06 NOTE — Progress Notes (Signed)
HPI Patient is a 53 year old with a history of afib and CHF. I saw him in November 2014    He underwent cardioversion early 2013 . On amio and coumadin. LVEF  normalized LV dysfunction felt to be tachy induced.  I had decreased amio to 200 qd except 100 on F/S/S but on last visit due to breakthrough increased back to 200 qd.  ALso history of dyslipidemia and tob use  As well as metastatic lung cnacer. Since seen he has undergone chemo and XRT He says he has had one spell of afib since seen. Breathing is good  No CP  Says his strength is building since finishing XRT No Known Allergies  Current Outpatient Prescriptions  Medication Sig Dispense Refill  . amiodarone (PACERONE) 200 MG tablet TAKE 1 TABLET BY MOUTH daily      . lisinopril (PRINIVIL,ZESTRIL) 10 MG tablet Take 1 tablet (10 mg total) by mouth daily.  90 tablet  3  . warfarin (COUMADIN) 5 MG tablet TAKE AS DIRECTED BY COUMADIN CLINIC  45 tablet  3   No current facility-administered medications for this visit.    Past Medical History  Diagnosis Date  . DM2 (diabetes mellitus, type 2)     Hemoglobin A1c 7.1 in 11/12  . Alcohol abuse   . Tobacco abuse   . Atrial fibrillation     s/p TEE-DCCV 11/12;  amiodarone started 02/03/11  . Chronic systolic heart failure   . Arthritis     left elbow  . Gout   . Cardiomyopathy     In the setting of afib with rvr 11/12;  Echo 01/19/11:  Diffuse HK worse in Inf wall and septum, mod LVE, mild LVH, EF 30%, mild BAE    Past Surgical History  Procedure Laterality Date  . Cardioversion  01/20/2011    Procedure: CARDIOVERSION;  Surgeon: Fay Records, MD;  Location: [place];  Service: Cardiovascular;  Laterality: N/A;  . Tee without cardioversion  01/20/2011    Procedure: TRANSESOPHAGEAL ECHOCARDIOGRAM (TEE);  Surgeon: Fay Records, MD;  Location[facility] ENDOSCOPY;  Service: Cardiovascular;  Laterality: N/A;  . Cardioversion  01/20/2011    Procedure: CARDIOVERSION;  Surgeon: Fay Records, MD;   Location: [place];  Service: Cardiovascular;  Laterality: N/A;  . Cardioversion  02/27/2011    Procedure: CARDIOVERSION;  Surgeon: Lelon Perla, MD;  Location: [facility] OR;  Service: Cardiovascular;  Laterality: N/A;    Family History  Problem Relation Age of Onset  . Diabetes Father     Multiple relatives in his family as well  . Peripheral vascular disease Mother     Died from complications from an aortic surgery  . Anesthesia problems Neg Hx   . Hypotension Neg Hx   . Malignant hyperthermia Neg Hx   . Pseudochol deficiency Neg Hx     History   Social History  . Marital Status: Single    Spouse Name: N/A    Number of Children: N/A  . 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 Smoking status: Current Every Day Smoker -- 2.00 packs/day for 35 years    Types: Cigarettes  . Smokeless tobacco: Not on file  . Alcohol Use: 4.8 oz/week    8 Cans of beer per week     Comment: In winter, 4 beers 1-2x/week. In summer, >6 beers  daily.  . Drug Use: No  . Sexual Activity: Not on file   Other Topics Concern  . Not on file   Social History Narrative  . No narrative on file    Review of Systems:  All systems reviewed.  They are negative to the above problem except as previously stated.  Vital Signs: BP 132/68  Pulse 88  Ht 5\' 11"  (1.803 m)  Wt 278 lb (126.1 kg)  BMI 38.79 kg/m2  Physical Exam Patient is a morbidly obese 53 yo in NAD HEENT:  Normocephalic, atraumatic. EOMI, PERRLA.  Neck: JVP is normal.  No bruits.  Lungs: clear to auscultation. No rales no wheezes.  Heart: Regular rate and rhythm. Normal S1, S2. No S3.   No significant murmurs. PMI not displaced.  Abdomen:  Supple, nontender. Normal bowel sounds. No masses. No hepatomegaly.  Extremities:   Good distal pulses throughout. Tr lower extremity edema.  Musculoskeletal :moving all  extremities.  Neuro:   alert and oriented x3.  CN II-XII grossly intact  Assessment and Plan:  1.  Afib  He reports one spell  Would keep on same regimen  Check labs  He is now on amio 200 qd  I had increased on last visit due to breakthrough  May be able to back down  2. .  HTN  Bp is good  Keep on same medicines.    3.  Lung Ca  Patient with metastatic dz He has finished chemo and XRT

## 2013-06-06 NOTE — Patient Instructions (Signed)
Your physician recommends that you return for lab work in: TODAY (CMET, TSH, CBC)  Your physician recommends that you continue on your current medications as directed. Please refer to the Current Medication list given to you today. Your physician wants you to follow-up in: [place].  You will receive a reminder letter in the mail two months in advance. If you don't receive a letter, please call our office to schedule the follow-up appointment.

## 2013-06-29 ENCOUNTER — Other Ambulatory Visit: Payer: Self-pay | Admitting: Internal Medicine

## 2013-07-05 ENCOUNTER — Other Ambulatory Visit: Payer: Self-pay | Admitting: *Deleted

## 2013-07-05 MED ORDER — WARFARIN SODIUM 5 MG PO TABS: ORAL_TABLET | ORAL | Status: DC

## 2013-07-11 ENCOUNTER — Other Ambulatory Visit: Payer: Self-pay | Admitting: Internal Medicine

## 2013-09-09 ENCOUNTER — Non-Acute Institutional Stay (SKILLED_NURSING_FACILITY): Payer: BC Managed Care – PPO | Admitting: Internal Medicine

## 2013-09-09 ENCOUNTER — Encounter: Payer: Self-pay | Admitting: Internal Medicine

## 2013-09-09 DIAGNOSIS — K219 Gastro-esophageal reflux disease without esophagitis: Secondary | ICD-10-CM

## 2013-09-09 DIAGNOSIS — I629 Nontraumatic intracranial hemorrhage, unspecified: Secondary | ICD-10-CM

## 2013-09-09 DIAGNOSIS — C801 Malignant (primary) neoplasm, unspecified: Secondary | ICD-10-CM

## 2013-09-09 DIAGNOSIS — K5900 Constipation, unspecified: Secondary | ICD-10-CM

## 2013-09-09 DIAGNOSIS — I4891 Unspecified atrial fibrillation: Secondary | ICD-10-CM

## 2013-09-09 DIAGNOSIS — D638 Anemia in other chronic diseases classified elsewhere: Secondary | ICD-10-CM

## 2013-09-09 DIAGNOSIS — R5381 Other malaise: Secondary | ICD-10-CM

## 2013-09-09 DIAGNOSIS — R5383 Other fatigue: Secondary | ICD-10-CM

## 2013-09-09 DIAGNOSIS — R531 Weakness: Secondary | ICD-10-CM

## 2013-09-09 DIAGNOSIS — Z720 Tobacco use: Secondary | ICD-10-CM

## 2013-09-09 DIAGNOSIS — F17200 Nicotine dependence, unspecified, uncomplicated: Secondary | ICD-10-CM

## 2013-09-09 DIAGNOSIS — C3490 Malignant neoplasm of unspecified part of unspecified bronchus or lung: Secondary | ICD-10-CM

## 2013-09-09 NOTE — Progress Notes (Signed)
Patient ID: Bryan Rice, male   DOB: 1960/07/08, 53 y.o.   MRN: [id number]     Facility: [place]   PCP: Curly Rim, MD  Code Status: full code  No Known Allergies  Chief Complaint: new admission  HPI:  53 y/o male patient is here for STR after hospital admission from 08/26/13-09/06/13 with increased difficulty walking and problem with co-ordination. Ct of brain showed small parenchymal hemorrhage with no mass effect and stable metastatic lesions in the brain. He also had brain MRI showing multiple brain and spinal cord metastases. He was supratherapeutic with his inr upto 16.5 and this was reversed with kcentra and vitamin k. He was seen by oncology and radiation oncology team. 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 Goal is for him to return home and follow up with oncology. He is seen in his room. He is using a walker to get around. Denies dyspnea. Feels weak and gets tired easily. No other concerns   Review of Systems:  Constitutional: Negative for fever, chills, diaphoresis.  HENT: Negative for congestion, hearing loss and sore throat.   Eyes: Negative for eye pain, blurred vision, double vision and discharge.  Respiratory: Negative for cough, sputum production, shortness of breath and wheezing.   Cardiovascular: Negative for chest pain, palpitations Gastrointestinal: Negative for heartburn, nausea, vomiting, abdominal pain, diarrhea and constipation. poor appetite Genitourinary: Negative for dysuria Musculoskeletal: Negative for back pain, falls, joint pain  Skin: Negative for itching and rash.  Neurological: Negative for tingling, focal weakness and headaches.  Psychiatric/Behavioral: Negative for depression and memory loss. The patient is not nervous/anxious.     Past Medical History  Diagnosis Date  . DM2 (diabetes mellitus, type 2)     Hemoglobin A1c 7.1 in 11/12  . Alcohol  abuse   . Tobacco abuse   . Atrial fibrillation     s/p TEE-DCCV 11/12;  amiodarone started 02/03/11  . Chronic systolic heart failure   . Arthritis     left elbow  . Gout   . Cardiomyopathy     In the setting of afib with rvr 11/12;  Echo 01/19/11:  Diffuse HK worse in Inf wall and septum, mod LVE, mild LVH, EF 30%, mild BAE   Past Surgical History  Procedure Laterality Date  . Cardioversion  01/20/2011    Procedure: CARDIOVERSION;  Surgeon: Fay Records, MD;  Location: [place];  Service: Cardiovascular;  Laterality: N/A;  . Tee without cardioversion  01/20/2011    Procedure: TRANSESOPHAGEAL ECHOCARDIOGRAM (TEE);  Surgeon: Fay Records, MD;  Location: [facility] ENDOSCOPY;  Service: Cardiovascular;  Laterality: N/A;  . Cardioversion  01/20/2011    Procedure: CARDIOVERSION;  Surgeon: Fay Records, MD;  Location: [place];  Service: Cardiovascular;  Laterality: N/A;  . Cardioversion  02/27/2011    Procedure: CARDIOVERSION;  Surgeon: Lelon Perla, MD;  Location: [place];  Service: Cardiovascular;  Laterality: N/A;   Social History:   reports that he has been smoking Cigarettes.  He has a 70 pack-year smoking history. He does not have any smokeless tobacco history on file. He reports that he drinks about 4.8 ounces of alcohol per week. He reports that he does not use illicit drugs.  Family History  Problem Relation Age of Onset  . Diabetes Father     Multiple relatives in his family as well  . Peripheral vascular disease Mother     Died from  complications from an aortic surgery  . Anesthesia problems Neg Hx   . Hypotension Neg Hx   . Malignant hyperthermia Neg Hx   . Pseudochol deficiency Neg Hx     Medications: Patient's Medications  New Prescriptions   No medications on file  Previous Medications   ACETAMINOPHEN (TYLENOL) 325 MG TABLET    Take 650 mg by mouth every 6 (six) hours as needed.   BISACODYL (DULCOLAX) 5 MG EC TABLET    Take 5 mg by mouth daily as needed for moderate  constipation.   FAMOTIDINE (PEPCID) 20 MG TABLET    Take 20 mg by mouth 2 (two) times daily.   FLUTICASONE FUROATE-VILANTEROL 100-25 MCG/INH AEPB    Inhale 1 puff into the lungs at bedtime.   NICOTINE (NICODERM CQ - DOSED IN MG/24 HOURS) 21 MG/24HR PATCH    Place 21 mg onto the skin daily.   THIAMINE 100 MG TABLET    Take 100 mg by mouth daily.  Modified Medications   No medications on file  Discontinued Medications   AMIODARONE (PACERONE) 200 MG TABLET    TAKE 1 TABLET BY MOUTH DAILY MONDAY THROUGH THURSDAY, AND 1/2 TABLET DAILY FRIDAY THROUGH SUNDAY   LISINOPRIL (PRINIVIL,ZESTRIL) 10 MG TABLET    TAKE 1 TABLET BY MOUTH DAILY   WARFARIN (COUMADIN) 5 MG TABLET    1.5 tablets on all days of the week or as directed by coumadin clinic    Physical Exam: Filed Vitals:   09/09/13 0946  BP: 127/80  Pulse: 90  Temp: 98.1 F (36.7 C)  Resp: 18  Height: 5\' 10"  (1.778 m)  Weight: 216 lb (97.977 kg)  SpO2: 96%    General- adult male in no acute distress Head- atraumatic, normocephalic Eyes- PERRLA, EOMI, no pallor, no icterus, no discharge Neck- no lymphadenopathy, no thyromegaly Throat- moist mucus membrane Cardiovascular- normal s1,s2, no murmurs Respiratory- CTAB,  no wheeze, no rhonchi, no crackles, no use of accessory muscles Abdomen- bowel sounds present, soft, non tender Musculoskeletal- able to move all 4 extremities,  no leg edema, using a walker Neurological- no focal deficit Skin- warm and dry Psychiatry- alert and oriented to person, place and time, normal mood and affect  Labs reviewed: Basic Metabolic Panel:  Recent Labs  06/06/13 1643  NA 135  K 4.3  CL 100  CO2 26  GLUCOSE 123*  BUN 14  CREATININE 1.1  CALCIUM 9.4   Liver Function Tests:  Recent Labs  06/06/13 1643  AST 20  ALT 19  ALKPHOS 79  BILITOT 0.5  PROT 7.2  ALBUMIN 3.6   No results found for this basename: LIPASE, AMYLASE,  in the last 8760 hours No results found for this basename:  AMMONIA,  in the last 8760 hours CBC:  Recent Labs  06/06/13 1643  WBC 4.6  HGB 12.2*  HCT 35.7*  MCV 100.0  PLT 221.0   08/27/13 t.chol 104, hdl 29, ldl 41, tg 170, a1c 6 08/28/13 wbc 4, hb 7.3, hct 20.8, plt176 09/04/13 inr 1.2  Radiological Exams: Reviewed his imaging records from d/c summary. Review chart for full details  Assessment/Plan  Generalized weakness Will have patient work with PT/OT as tolerated to regain strength and restore function.  Fall precautions are in place.  Lung cancer with mets Follows with his oncologist. Not on any breathing treatment. 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 Conservative approach for now.  Pt off all medications  gerd Stable with famotidine, continue this. On thiamine, encouraged abstaining from alcohol  afib Off all medications, heart rate controlled. Off coumadin with ICH  Anemia From his malignancy and recent head bleed. Recheck cbc  Tobacco user Continues to smoke, using nicotine patch in facility   Family/ staff Communication: reviewed care plan with patient and nursing supervisor   Goals of care: short term rehabilitation   Labs/tests ordered: cbc    Blanchie Serve, MD  [place] (Monday-Friday 8 am - 5 pm) [phone] (afterhours)

## 2013-09-10 ENCOUNTER — Non-Acute Institutional Stay (SKILLED_NURSING_FACILITY): Payer: BC Managed Care – PPO | Admitting: Internal Medicine

## 2013-09-10 DIAGNOSIS — C3490 Malignant neoplasm of unspecified part of unspecified bronchus or lung: Secondary | ICD-10-CM

## 2013-09-10 DIAGNOSIS — C7949 Secondary malignant neoplasm of other parts of nervous system: Secondary | ICD-10-CM

## 2013-09-10 DIAGNOSIS — K219 Gastro-esophageal reflux disease without esophagitis: Secondary | ICD-10-CM

## 2013-09-10 DIAGNOSIS — D638 Anemia in other chronic diseases classified elsewhere: Secondary | ICD-10-CM

## 2013-09-10 DIAGNOSIS — C7931 Secondary malignant neoplasm of brain: Secondary | ICD-10-CM | POA: Insufficient documentation

## 2013-09-10 DIAGNOSIS — C801 Malignant (primary) neoplasm, unspecified: Secondary | ICD-10-CM

## 2013-09-10 DIAGNOSIS — K5900 Constipation, unspecified: Secondary | ICD-10-CM

## 2013-09-10 NOTE — Progress Notes (Signed)
Patient ID: Bryan Rice, male   DOB: 09/13/1960, 53 y.o.   MRN: [id number]    Facility: [facility]  Chief Complaint  Patient presents with  . Discharge Note    discharge visit   No Known Allergies  HPI:   53 y/o male patient is here for STR after hospital admission with increased difficulty walking and problem with co-ordination. He has history of lung cancer with metastases and leptomeningeal carcinomatosis. he is working with rehab team here and being discharged home on Monday. He refuses to go home with therapy team. He lives at home. He is going to a hotel with handicap access to be able to move around with a walker. He does not have a walker at home. He then eventually would like to move into apartment home. He will follow with his oncologist who is his PCP as well.  He is seen in his room. He is using a walker to get around. no concerns  Review of Systems:  Constitutional: Negative for fever, chills, diaphoresis.  Respiratory: Negative for cough, sputum production, shortness of breath and wheezing.   Cardiovascular: Negative for chest pain, palpitations Gastrointestinal: Negative for heartburn, nausea, vomiting, abdominal pain Musculoskeletal: tired easily Skin: Negative for itching and rash.  Neurological: Negative for headaches.  Psychiatric/Behavioral: Negative for depression   Current Outpatient Prescriptions on File Prior to Visit  Medication Sig Dispense Refill  . acetaminophen (TYLENOL) 325 MG tablet Take 650 mg by mouth every 6 (six) hours as needed.      . bisacodyl (DULCOLAX) 5 MG EC tablet Take 5 mg by mouth daily as needed for moderate constipation.      . famotidine (PEPCID) 20 MG tablet Take 20 mg by mouth 2 (two) times daily.      . Fluticasone Furoate-Vilanterol 100-25 MCG/INH AEPB Inhale 1 puff into the lungs at bedtime.      . nicotine (NICODERM CQ - DOSED IN MG/24 HOURS) 21 mg/24hr patch Place 21 mg onto the skin daily.      Marland Kitchen thiamine  100 MG tablet Take 100 mg by mouth daily.       No current facility-administered medications on file prior to visit.   Physical exam VSS, afebrile  General- adult male in no acute distress Throat- moist mucus membrane Cardiovascular- normal s1,s2, no murmurs Respiratory- CTAB,  no wheeze, no rhonchi, no crackles, no use of accessory muscles Abdomen- bowel sounds present, soft, non tender Musculoskeletal- able to move all 4 extremities,  no leg edema, using a walker Neurological- no focal deficit Skin- warm and dry Psychiatry- alert and oriented to person, place and time, normal mood and affect  Assessment/plan  Patient is stable to be discharged home, refusing home health services at present. Have spoken with social worker to see if outpatient therapy can be arranged for now. DME script for roller walker with skis provided. 30 days script fo his medication provided. To see Dr Para March on 09/14/13 at 1:45 pm.  Lung cancer with mets Follows with his oncologist. Not on any breathing treatment. Breathing currently stable. Encouraged to stop smoking, pt not willing at present  Cerebellar vermis ICH with brain mets  leptomeningeal carcinomatosis. Conservative approach for now. Pt off all medications  Constipation Stable on current regimen- bisacodyl  gerd Stable with famotidine, continue this. On thiamine, encouraged abstaining from alcohol  Anemia From his malignancy and recent head bleed. To follow with oncology

## 2013-09-16 NOTE — Progress Notes (Signed)
Patient ID: Bryan Rice, male   DOB: Aug 07, 1960, 53 y.o.   MRN: [id number]  Patient was supposed to be discharged last week but continues to be here with insurance extending his coverage. He will now be discharged next week. He will need a DME script for a wheelchiar to help him perform daily activities like toileting, grooming and bathing at home. A walker will not rsolve his issues. A wheelcahir will allow him to perform daily activities safely.

## 2013-09-30 ENCOUNTER — Telehealth: Payer: Self-pay | Admitting: Internal Medicine

## 2013-09-30 NOTE — Telephone Encounter (Signed)
Faxed to [place] at [facility].

## 2013-09-30 NOTE — Telephone Encounter (Signed)
New message      Pt is now living at [facility].  PCP needs Dr Harrington Challenger to fax to [facility] a presc for the amiodarone to be self administered.  Please fax to [place] at [phone].  Until they get a faxed presc from us--[facility] is giving him his amiodarone but it cost the pt 300.00 daily extra for them to administer the medication.

## 2013-10-03 ENCOUNTER — Encounter: Payer: Self-pay | Admitting: *Deleted

## 2013-10-03 ENCOUNTER — Telehealth: Payer: Self-pay | Admitting: *Deleted

## 2013-10-03 ENCOUNTER — Other Ambulatory Visit: Payer: Self-pay | Admitting: Medical Oncology

## 2013-10-03 DIAGNOSIS — C3490 Malignant neoplasm of unspecified part of unspecified bronchus or lung: Secondary | ICD-10-CM

## 2013-10-03 NOTE — [facility] Oncology Navigator Note (Unsigned)
Called referring office.  Requested CD of pt recent scans.

## 2013-10-03 NOTE — Telephone Encounter (Signed)
Nurse from [facility] does not understand what the prescription is saying. Please call and advise. Not sure of the number, I tried looking it up in [place] and can not find it.

## 2013-10-03 NOTE — [facility] Oncology Navigator Note (Unsigned)
Received referral from HIM department.  No images or pathology report regarding pt diagnosis of lung cancer.  Called assisted living, he is not at one listed.  Called referring physician's office unable to reach.

## 2013-10-03 NOTE — Telephone Encounter (Signed)
Called pt to update regarding appt for [facility] on 10/06/13.  He verbalized understanding of appt

## 2013-10-03 NOTE — Telephone Encounter (Signed)
Called and spoke with pt regarding appt for [facility] 10/06/13 at 1:30.  Pt verbalized understanding of appt time and place.

## 2013-10-04 ENCOUNTER — Inpatient Hospital Stay
Admission: RE | Admit: 2013-10-04 | Discharge: 2013-10-04 | Disposition: A | Discharge status: Home / Self Care | Payer: Self-pay | Source: Ambulatory Visit | Attending: Internal Medicine | Admitting: Internal Medicine

## 2013-10-04 ENCOUNTER — Other Ambulatory Visit: Payer: Self-pay | Admitting: Internal Medicine

## 2013-10-04 ENCOUNTER — Encounter: Payer: Self-pay | Admitting: *Deleted

## 2013-10-04 ENCOUNTER — Inpatient Hospital Stay: Admission: RE | Admit: 2013-10-04 | Payer: Self-pay | Source: Ambulatory Visit

## 2013-10-04 DIAGNOSIS — C801 Malignant (primary) neoplasm, unspecified: Secondary | ICD-10-CM

## 2013-10-04 NOTE — Telephone Encounter (Signed)
Patient is on 200 mg amidarone per day My prescription was that he should be able to take this 1x per day

## 2013-10-05 ENCOUNTER — Inpatient Hospital Stay
Admission: RE | Admit: 2013-10-05 | Discharge: 2013-10-05 | Disposition: A | Discharge status: Home / Self Care | Payer: Self-pay | Source: Ambulatory Visit | Attending: Internal Medicine | Admitting: Internal Medicine

## 2013-10-05 ENCOUNTER — Other Ambulatory Visit: Payer: Self-pay | Admitting: Internal Medicine

## 2013-10-05 ENCOUNTER — Telehealth: Payer: Self-pay | Admitting: Internal Medicine

## 2013-10-05 DIAGNOSIS — C801 Malignant (primary) neoplasm, unspecified: Secondary | ICD-10-CM

## 2013-10-05 NOTE — Telephone Encounter (Signed)
Needs RX/order to take Amiodarone 200mg  QD.  Original order stated once a mth.  Please have on there also that patient can self administer. Please call with any questions. Danelle Berry has been taking care of this. / tgs

## 2013-10-05 NOTE — Telephone Encounter (Signed)
Will forward to Dr. Ross.  

## 2013-10-06 ENCOUNTER — Encounter: Payer: Self-pay | Admitting: Internal Medicine

## 2013-10-06 ENCOUNTER — Ambulatory Visit (HOSPITAL_BASED_OUTPATIENT_CLINIC_OR_DEPARTMENT_OTHER): Payer: BC Managed Care – PPO | Admitting: Internal Medicine

## 2013-10-06 ENCOUNTER — Ambulatory Visit: Payer: BC Managed Care – PPO | Admitting: Internal Medicine

## 2013-10-06 ENCOUNTER — Encounter: Payer: Self-pay | Admitting: *Deleted

## 2013-10-06 ENCOUNTER — Ambulatory Visit: Payer: BC Managed Care – PPO | Admitting: Physical Therapy

## 2013-10-06 ENCOUNTER — Telehealth: Payer: Self-pay | Admitting: Internal Medicine

## 2013-10-06 VITALS — BP 128/75 | HR 85 | Temp 97.7°F | Resp 20 | Ht 70.0 in

## 2013-10-06 DIAGNOSIS — F17200 Nicotine dependence, unspecified, uncomplicated: Secondary | ICD-10-CM

## 2013-10-06 DIAGNOSIS — C3490 Malignant neoplasm of unspecified part of unspecified bronchus or lung: Secondary | ICD-10-CM

## 2013-10-06 DIAGNOSIS — R0989 Other specified symptoms and signs involving the circulatory and respiratory systems: Secondary | ICD-10-CM

## 2013-10-06 DIAGNOSIS — C3491 Malignant neoplasm of unspecified part of right bronchus or lung: Secondary | ICD-10-CM

## 2013-10-06 DIAGNOSIS — E119 Type 2 diabetes mellitus without complications: Secondary | ICD-10-CM

## 2013-10-06 DIAGNOSIS — C7970 Secondary malignant neoplasm of unspecified adrenal gland: Secondary | ICD-10-CM

## 2013-10-06 DIAGNOSIS — R0609 Other forms of dyspnea: Secondary | ICD-10-CM

## 2013-10-06 DIAGNOSIS — I4891 Unspecified atrial fibrillation: Secondary | ICD-10-CM

## 2013-10-06 DIAGNOSIS — R5381 Other malaise: Secondary | ICD-10-CM

## 2013-10-06 DIAGNOSIS — C7949 Secondary malignant neoplasm of other parts of nervous system: Secondary | ICD-10-CM

## 2013-10-06 DIAGNOSIS — C7931 Secondary malignant neoplasm of brain: Secondary | ICD-10-CM

## 2013-10-06 DIAGNOSIS — R5383 Other fatigue: Secondary | ICD-10-CM

## 2013-10-06 MED ORDER — CYANOCOBALAMIN 1000 MCG/ML IJ SOLN
1000.0000 ug | Freq: Once | INTRAMUSCULAR | Status: AC
  Administered 2013-10-06: 1000 ug via INTRAMUSCULAR

## 2013-10-06 MED ORDER — CYANOCOBALAMIN 1000 MCG/ML IJ SOLN
INTRAMUSCULAR | Status: AC
  Filled 2013-10-06: qty 1

## 2013-10-06 NOTE — Patient Instructions (Signed)
Smoking Cessation Quitting smoking is important to your health and has many advantages. However, it is not always easy to quit since nicotine is a very addictive drug. Oftentimes, people try 3 times or more before being able to quit. This document explains the best ways for you to prepare to quit smoking. Quitting takes hard work and a lot of effort, but you can do it. ADVANTAGES OF QUITTING SMOKING  You will live longer, feel better, and live better.  Your body will feel the impact of quitting smoking almost immediately.  Within 20 minutes, blood pressure decreases. Your pulse returns to its normal level.  After 8 hours, carbon monoxide levels in the blood return to normal. Your oxygen level increases.  After 24 hours, the chance of having a heart attack starts to decrease. Your breath, hair, and body stop smelling like smoke.  After 48 hours, damaged nerve endings begin to recover. Your sense of taste and smell improve.  After 72 hours, the body is virtually free of nicotine. Your bronchial tubes relax and breathing becomes easier.  After 2 to 12 weeks, lungs can hold more air. Exercise becomes easier and circulation improves.  The risk of having a heart attack, stroke, cancer, or lung disease is greatly reduced.  After 1 year, the risk of coronary heart disease is cut in half.  After 5 years, the risk of stroke falls to the same as a nonsmoker.  After 10 years, the risk of lung cancer is cut in half and the risk of other cancers decreases significantly.  After 15 years, the risk of coronary heart disease drops, usually to the level of a nonsmoker.  If you are pregnant, quitting smoking will improve your chances of having a healthy baby.  The people you live with, especially any children, will be healthier.  You will have extra money to spend on things other than cigarettes. QUESTIONS TO THINK ABOUT BEFORE ATTEMPTING TO QUIT You may want to talk about your answers with your  health care provider.  Why do you want to quit?  If you tried to quit in the past, what helped and what did not?  What will be the most difficult situations for you after you quit? How will you plan to handle them?  Who can help you through the tough times? Your family? Friends? A health care provider?  What pleasures do you get from smoking? What ways can you still get pleasure if you quit? Here are some questions to ask your health care provider:  How can you help me to be successful at quitting?  What medicine do you think would be best for me and how should I take it?  What should I do if I need more help?  What is smoking withdrawal like? How can I get information on withdrawal? GET READY  Set a quit date.  Change your environment by getting rid of all cigarettes, ashtrays, matches, and lighters in your home, car, or work. Do not let people smoke in your home.  Review your past attempts to quit. Think about what worked and what did not. GET SUPPORT AND ENCOURAGEMENT You have a better chance of being successful if you have help. You can get support in many ways.  Tell your family, friends, and coworkers that you are going to quit and need their support. Ask them not to smoke around you.  Get individual, group, or telephone counseling and support. Programs are available at local hospitals and health centers. Call   your local health department for information about programs in your area.  Spiritual beliefs and practices may help some smokers quit.  Download a "quit meter" on your computer to keep track of quit statistics, such as how long you have gone without smoking, cigarettes not smoked, and money saved.  Get a self-help book about quitting smoking and staying off tobacco. LEARN NEW SKILLS AND BEHAVIORS  Distract yourself from urges to smoke. Talk to someone, go for a walk, or occupy your time with a task.  Change your normal routine. Take a different route to work.  Drink tea instead of coffee. Eat breakfast in a different place.  Reduce your stress. Take a hot bath, exercise, or read a book.  Plan something enjoyable to do every day. Reward yourself for not smoking.  Explore interactive web-based programs that specialize in helping you quit. GET MEDICINE AND USE IT CORRECTLY Medicines can help you stop smoking and decrease the urge to smoke. Combining medicine with the above behavioral methods and support can greatly increase your chances of successfully quitting smoking.  Nicotine replacement therapy helps deliver nicotine to your body without the negative effects and risks of smoking. Nicotine replacement therapy includes nicotine gum, lozenges, inhalers, nasal sprays, and skin patches. Some may be available over-the-counter and others require a prescription.  Antidepressant medicine helps people abstain from smoking, but how this works is unknown. This medicine is available by prescription.  Nicotinic receptor partial agonist medicine simulates the effect of nicotine in your brain. This medicine is available by prescription. Ask your health care provider for advice about which medicines to use and how to use them based on your health history. Your health care provider will tell you what side effects to look out for if you choose to be on a medicine or therapy. Carefully read the information on the package. Do not use any other product containing nicotine while using a nicotine replacement product.  RELAPSE OR DIFFICULT SITUATIONS Most relapses occur within the first 3 months after quitting. Do not be discouraged if you start smoking again. Remember, most people try several times before finally quitting. You may have symptoms of withdrawal because your body is used to nicotine. You may crave cigarettes, be irritable, feel very hungry, cough often, get headaches, or have difficulty concentrating. The withdrawal symptoms are only temporary. They are strongest  when you first quit, but they will go away within 10-14 days. To reduce the chances of relapse, try to:  Avoid drinking alcohol. Drinking lowers your chances of successfully quitting.  Reduce the amount of caffeine you consume. Once you quit smoking, the amount of caffeine in your body increases and can give you symptoms, such as a rapid heartbeat, sweating, and anxiety.  Avoid smokers because they can make you want to smoke.  Do not let weight gain distract you. Many smokers will gain weight when they quit, usually less than 10 pounds. Eat a healthy diet and stay active. You can always lose the weight gained after you quit.  Find ways to improve your mood other than smoking. FOR MORE INFORMATION  www.smokefree.gov  Document Released: 02/04/2001 Document Revised: 06/27/2013 Document Reviewed: 05/22/2011 ExitCare Patient Information 2015 [employer]. This information is not intended to replace advice given to you by your health care provider. Make sure you discuss any questions you have with your health care provider.  

## 2013-10-06 NOTE — Telephone Encounter (Signed)
Faxed script from Dr. Harrington Challenger for amiodarone 200 mg 1 tablet po daily--ok for patient to self administer to [place] at [facility]. Called and left message for Mendel Ryder that the script is on its way.

## 2013-10-06 NOTE — Telephone Encounter (Signed)
Pt confirmed labs/ov per 08/13 POF, gave pt AVS..Marland KitchenKJ

## 2013-10-06 NOTE — Progress Notes (Signed)
[place] Telephone:([phone]   [phone]  CONSULT NOTE  REFERRING PHYSICIAN: Dr. Talbert Forest Corrington   REASON FOR CONSULTATION:  53 years old white male with history of metastatic lung cancer  HPI Bryan Rice is a 53 y.o. male with a past medical history significant for multiple medical problems including history of diabetes mellitus, atrial fibrillation, cardiomyopathy, history of alcohol abuse, gout and long history of smoking. The patient mentions that on 09/13/2012 he slipped down and fell at work and injured the left side of his chest with left rib fracture. During his evaluation he had CT scan of the chest performed on 09/13/2012 and it showed 2.1 x 2.8 CM right lower lobe lung nodule with hilar as well as mediastinal and supraclavicular lymphadenopathy. The scan also showed left adrenal gland lesion. On 10/06/2012 he underwent CT-guided biopsy of the left adrenal gland and the final pathology was consistent with adenocarcinoma of lung primary. I am not sure if the tissues were sent for molecular markers like EGFR mutation or ALK gene translocation. MRI of the brain for staging workup at that time showed multiple small metastases in the frontal lobe and cerebellum. But since the patient was asymptomatic he proceeded with systemic chemotherapy first. He started systemic chemotherapy with carboplatin and Alimta every 3 weeks on 11/08/2012 for a total of 6 cycles. His imaging studies after completion of the induction chemotherapy with carboplatin and Alimta showed post of response to the treatment. The patient was referred to radiation oncology for consideration of palliative radiotherapy to the brain lesions and he underwent whole brain irradiation completed in February of 2015. It was discussed with the patient at that time consideration of maintenance chemotherapy with single agent Alimta but this was never started.  Repeat MRI of the brain on 08/05/2013 showed a  slight enlargement of multiple brain metastasis. He continued on observation. CT scan of the chest, abdomen and pelvis performed on the same day showed slightly larger right lower lobe tumor. There was a stable 5 mm left lower lobe nodule. There was numerous tiny bilateral pulmonary metastases that have developed in addition to progressing mediastinal adenopathy and larger left adrenal metastasis and possibly metastatic deposits in the right adrenal.  The patient was evaluated for treatment with immunotherapy with Nivolumab according to clinical trial at [facility] but according to the record it was not started as the patient his condition was getting worse. There was a concern about leptomeningeal disease but lumbar puncture was performed and the CSF was negative for malignancy. The patient recently moved to [place] and currently a resident of a skilled nursing facility in [place]. He was referred to me today to establish care at the [facility] and also to discuss his treatment options. When seen today he is feeling fine except for the weakness in the right leg as well as fatigue and weight loss of over 70 pounds in the last 6 months. He also has some headache but no significant visual changes. He has dizzy spells that time. The patient denied having any significant nausea or vomiting. He denied having any significant chest pain but continues to have shortness breath with exertion with no cough or hemoptysis. Family history significant for a mother with peripheral vascular disease and father with diabetes mellitus. He has an aunt with Leukemia.  The patient is single and has no children. He was to manage irrigation system for sports fields. He has a history of smoking 2 pack  per day for over 40 years and unfortunately he continues to smoke but work on quitting. He also drinks a few alcoholic drinks every day and has a history of marijuana use.  HPI  Past Medical History    Diagnosis Date  . DM2 (diabetes mellitus, type 2)     Hemoglobin A1c 7.1 in 11/12  . Alcohol abuse   . Tobacco abuse   . Atrial fibrillation     s/p TEE-DCCV 11/12;  amiodarone started 02/03/11  . Chronic systolic heart failure   . Arthritis     left elbow  . Gout   . Cardiomyopathy     In the setting of afib with rvr 11/12;  Echo 01/19/11:  Diffuse HK worse in Inf wall and septum, mod LVE, mild LVH, EF 30%, mild BAE    Past Surgical History  Procedure Laterality Date  . Cardioversion  01/20/2011    Procedure: CARDIOVERSION;  Surgeon: Fay Records, MD;  Location: [facility];  Service: Cardiovascular;  Laterality: N/A;  . Tee without cardioversion  01/20/2011    Procedure: TRANSESOPHAGEAL ECHOCARDIOGRAM (TEE);  Surgeon: Fay Records, MD;  Location: [facility] ENDOSCOPY;  Service: Cardiovascular;  Laterality: N/A;  . Cardioversion  01/20/2011    Procedure: CARDIOVERSION;  Surgeon: Fay Records, MD;  Location: [facility];  Service: Cardiovascular;  Laterality: N/A;  . Cardioversion  02/27/2011    Procedure: CARDIOVERSION;  Surgeon: Lelon Perla, MD;  Location: [facility] OR;  Service: Cardiovascular;  Laterality: N/A;    Family History  Problem Relation Age of Onset  . Diabetes Father     Multiple relatives in his family as well  . Peripheral vascular disease Mother     Died from complications from an aortic surgery  . Anesthesia problems Neg Hx   . Hypotension Neg Hx   . Malignant hyperthermia Neg Hx   . Pseudochol deficiency Neg Hx     Social History History  Substance Use Topics  . Smoking status: Current Every Day Smoker -- 2.00 packs/day for 35 years    Types: Cigarettes  . Smokeless tobacco: Not on file  . Alcohol Use: 4.8 oz/week    8 Cans of beer per week     Comment: In winter, 4 beers 1-2x/week. In summer, >6 beers daily.    No Known Allergies  Current Outpatient Prescriptions  Medication Sig Dispense Refill  . acetaminophen (TYLENOL) 325 MG tablet Take 650 mg by mouth  every 6 (six) hours as needed.      . bisacodyl (DULCOLAX) 5 MG EC tablet Take 5 mg by mouth daily as needed for moderate constipation.       Current Facility-Administered Medications  Medication Dose Route Frequency Provider Last Rate Last Dose  . cyanocobalamin ((VITAMIN B-12)) injection 1,000 mcg  1,000 mcg Intramuscular Once Curt Bears, MD        Review of Systems  Constitutional: positive for anorexia, fatigue and weight loss Eyes: negative Ears, nose, mouth, throat, and face: negative Respiratory: positive for dyspnea on exertion Cardiovascular: negative Gastrointestinal: negative Genitourinary:negative Integument/breast: negative Hematologic/lymphatic: negative Musculoskeletal:positive for muscle weakness Neurological: positive for vertigo and weakness Behavioral/Psych: negative Endocrine: negative  Physical Exam  TTS:VXBLT, healthy, no distress, well nourished and well developed SKIN: skin color, texture, turgor are normal, no rashes or significant lesions HEAD: Normocephalic, No masses, lesions, tenderness or abnormalities EYES: normal, PERRLA EARS: External ears normal, Canals clear OROPHARYNX:no exudate, no erythema and lips, buccal mucosa, and tongue normal  NECK: supple, no adenopathy,  no JVD LYMPH:  no palpable lymphadenopathy, no hepatosplenomegaly LUNGS: clear to auscultation , and palpation HEART: regular rate & rhythm and no murmurs ABDOMEN:abdomen soft, non-tender, normal bowel sounds and no masses or organomegaly BACK: Back symmetric, no curvature., No CVA tenderness EXTREMITIES:no joint deformities, effusion, or inflammation, no edema, no skin discoloration  NEURO: alert & oriented x 3 with fluent speech, no focal motor/sensory deficits  PERFORMANCE STATUS: ECOG 2  LABORATORY DATA: Lab Results  Component Value Date   WBC 4.6 06/06/2013   HGB 12.2* 06/06/2013   HCT 35.7* 06/06/2013   MCV 100.0 06/06/2013   PLT 221.0 06/06/2013      Chemistry        Component Value Date/Time   NA 135 06/06/2013 1643   K 4.3 06/06/2013 1643   CL 100 06/06/2013 1643   CO2 26 06/06/2013 1643   BUN 14 06/06/2013 1643   CREATININE 1.1 06/06/2013 1643      Component Value Date/Time   CALCIUM 9.4 06/06/2013 1643   ALKPHOS 79 06/06/2013 1643   AST 20 06/06/2013 1643   ALT 19 06/06/2013 1643   BILITOT 0.5 06/06/2013 1643       RADIOGRAPHIC STUDIES: No results found.  ASSESSMENT: This is a very pleasant 53 years old white male with history of metastatic non-small cell lung cancer, adenocarcinoma with unknown molecular biomarkers. He presented with metastatic disease in the lung, adrenal gland and brainHe is status post 6 cycles of systemic chemotherapy was good response after the induction chemotherapy. This treatment was discontinued in December of 2014. The patient also has whole brain irradiation. His recent CT scan of the chest, abdomen and pelvis as well as MRI of the brain performed in June of 2015 showed evidence for disease progression in the brain as well as the lung and adrenal glands.   PLAN: I had a lengthy discussion with the patient today about his current disease stage, prognosis and treatment options. The patient understands that he has an incurable condition and all of the treatment would be of palliative nature. I am not sure if the previous biopsy has enough tissue for molecular studies but is not available I may consider the patient for blood test with Garduant 360.  I gave the patient the option of palliative care and hospice referral versus consideration of systemic chemotherapy either with repeat combination of carboplatin and Alimta since the patient has good response to this treatment in the past versus single agent Alimta versus consideration of immunotherapy with Nivolumab. The patient is very interested in proceeding with treatment again and he agreed to consider repeat treatment with carboplatin and Alimta. I discussed with the patient  adverse effect of this treatment including but not limited to alopecia, myelosuppression, nausea and vomiting, peripheral neuropathy, liver or renal dysfunction. I will arrange for the patient to have a chemotherapy education class before starting the first cycle of his treatment. He'll receive vitamin B12 injection today. I will call his pharmacy with prescription for Compazine 10 mg by mouth every 6 hours as needed for nausea, Decadron 4 mg by mouth twice a day the day before, day of and day after the chemotherapy in addition to folic acid 1 mg by mouth daily.  I will also arrange for the patient to have repeat CT scan of the chest, abdomen and pelvis for restaging of his disease before starting the first cycle of this treatment since his last scan was 2 months ago. For the metastatic brain lesion, I referred the  patient to addition oncology for evaluation and discussion of his treatment options for these progressive lesions. I gave the patient the time to ask questions and I answered them completely to his satisfaction. He would like to proceed with the treatment as planned and he gives his verbal consent for the chemotherapy. The patient would come back for followup visit in 2 weeks for evaluation and management of any adverse effect of his treatment. He was advised to call immediately if he has any concerning symptoms in the interval. For smoke cessation, I strongly encouraged the patient to quit smoking and offered him smoke cessation program. The patient voices understanding of current disease status and treatment options and is in agreement with the current care plan.  All questions were answered. The patient knows to call the clinic with any problems, questions or concerns. We can certainly see the patient much sooner if necessary.  Thank you so much for allowing me to participate in the care of Leeanne Mannan. I will continue to follow up the patient with you and assist in his care.  I spent  55 minutes counseling the patient face to face. The total time spent in the appointment was 80 minutes.  Disclaimer: This note was dictated with voice recognition software. Similar sounding words can inadvertently be transcribed and may not be corrected upon review.   Deauna Yaw K. 10/06/2013, 3:35 PM

## 2013-10-06 NOTE — Progress Notes (Signed)
[facility] Clinical Social Work  Clinical Social Work met with patient and Futures trader at [employer] appointment to offer support and assess for psychosocial needs.  Medical oncologist reviewed patient's diagnosis and recommended treatment plan with patient/family.  Mr. Tapanes has previously been treated at [facility]- he has moved to [facility] and would like to establish care in [place].  The patient identified his best friend and their spouse as his main support network, shared he has many other friends that are available to provide support.  Mr. Espana shared his love for music and music festivals- he hopes to gain his strength so he can go to future festivals.  CSW encouraged patient to attend upcoming Living with Cancer class.  Clinical Social Work briefly discussed Clinical Social Work role and [employer] support programs/services.  Clinical Social Work encouraged patient to call with any additional questions or concerns.   Polo Riley, MSW, LCSW, OSW-C Clinical Social Worker [facility] [phone]

## 2013-10-07 ENCOUNTER — Telehealth: Payer: Self-pay | Admitting: *Deleted

## 2013-10-07 MED ORDER — PROCHLORPERAZINE MALEATE 10 MG PO TABS: 10.0000 mg | ORAL_TABLET | Freq: Four times a day (QID) | ORAL | Status: DC | PRN

## 2013-10-07 MED ORDER — DEXAMETHASONE 4 MG PO TABS: ORAL_TABLET | ORAL | Status: DC

## 2013-10-07 MED ORDER — FOLIC ACID 1 MG PO TABS: 1.0000 mg | ORAL_TABLET | Freq: Every day | ORAL | Status: DC

## 2013-10-07 NOTE — Telephone Encounter (Signed)
OK for patient to self administer amiodarone 200mg  1x perday.

## 2013-10-07 NOTE — Telephone Encounter (Signed)
Per staff message and POF I have scheduled appts. Advised scheduler of appts and that first appt moved before 12pm due to first time. JMW

## 2013-10-07 NOTE — Telephone Encounter (Signed)
K.Lawrence NP will sign rx and I will fax to assisted to living

## 2013-10-10 ENCOUNTER — Other Ambulatory Visit: Payer: BC Managed Care – PPO

## 2013-10-10 ENCOUNTER — Ambulatory Visit (HOSPITAL_COMMUNITY)
Admission: RE | Admit: 2013-10-10 | Discharge: 2013-10-10 | Disposition: A | Discharge status: Home / Self Care | Payer: BC Managed Care – PPO | Source: Ambulatory Visit | Attending: Internal Medicine | Admitting: Internal Medicine

## 2013-10-10 ENCOUNTER — Ambulatory Visit (HOSPITAL_COMMUNITY): Payer: BC Managed Care – PPO

## 2013-10-10 ENCOUNTER — Encounter (HOSPITAL_COMMUNITY): Payer: Self-pay

## 2013-10-10 DIAGNOSIS — C3491 Malignant neoplasm of unspecified part of right bronchus or lung: Secondary | ICD-10-CM

## 2013-10-10 DIAGNOSIS — C801 Malignant (primary) neoplasm, unspecified: Secondary | ICD-10-CM | POA: Diagnosis present

## 2013-10-10 DIAGNOSIS — C3490 Malignant neoplasm of unspecified part of unspecified bronchus or lung: Secondary | ICD-10-CM | POA: Insufficient documentation

## 2013-10-10 MED ORDER — IOHEXOL 300 MG/ML  SOLN
100.0000 mL | Freq: Once | INTRAMUSCULAR | Status: AC | PRN
  Administered 2013-10-10: 100 mL via INTRAVENOUS

## 2013-10-10 NOTE — Progress Notes (Signed)
Location/Histology of Brain Tumor: Multiple enhancing lesions again noted within the supratentorial and infratentorial brain.  Patient has metastatic non-small cell lung cancer, adenocarcinoma with unknown molecular biomarkers, has metastases in his upper back.   Patient presented 09/13/2012 when he slipped and fell at work and injured the left side of his chest with left rib fracture. During his evaluation he had CT scan of the chest performed on 09/13/2012 and it showed 2.1 x 2.8 CM right lower lobe lung nodule with hilar as well as mediastinal and supraclavicular lymphadenopathy.   Past or anticipated interventions, if any, per neurosurgery: no  Past or anticipated interventions, if any, per medical oncology: s/p carboplatin and Alimta every 3 weeks on 11/08/2012 for a total of 6 cycles.  Plans for repeat treatment with carboplatin and Alimta.  Had 2nd round 10/11/13.  Dose of Decadron, if applicable: will take with chemotherapy  Recent neurologic symptoms, if any:   Seizures: no  Headaches: yes has 2-3 short term headaches per day.   Nausea: no  Dizziness/ataxia: yes - when bending over to pick something up  Difficulty with hand coordination: no  Focal numbness/weakness: yes - left leg almost back to normal.  Can't put weight on right leg.  It is numb and will buckle.  Visual deficits/changes: no  Confusion/Memory deficits: no  Painful bone metastases at present, if any: yes  SAFETY ISSUES:  Prior radiation? Yes - completed whole brain radiotherapy on 05/04/13 at [facility]  Pacemaker/ICD? no  Possible current pregnancy? no  Is the patient on methotrexate? no  Additional Complaints / other details: Currently lives in an assisted living facility.  Patient is in a wheelchair today.

## 2013-10-11 ENCOUNTER — Other Ambulatory Visit: Payer: Self-pay | Admitting: Internal Medicine

## 2013-10-11 ENCOUNTER — Ambulatory Visit: Payer: BC Managed Care – PPO

## 2013-10-11 ENCOUNTER — Other Ambulatory Visit: Payer: Self-pay | Admitting: Medical Oncology

## 2013-10-11 ENCOUNTER — Ambulatory Visit (HOSPITAL_BASED_OUTPATIENT_CLINIC_OR_DEPARTMENT_OTHER): Payer: BC Managed Care – PPO

## 2013-10-11 ENCOUNTER — Other Ambulatory Visit (HOSPITAL_BASED_OUTPATIENT_CLINIC_OR_DEPARTMENT_OTHER): Payer: BC Managed Care – PPO

## 2013-10-11 VITALS — BP 112/63 | HR 82 | Temp 98.0°F | Resp 18

## 2013-10-11 DIAGNOSIS — C7970 Secondary malignant neoplasm of unspecified adrenal gland: Secondary | ICD-10-CM

## 2013-10-11 DIAGNOSIS — C3490 Malignant neoplasm of unspecified part of unspecified bronchus or lung: Secondary | ICD-10-CM

## 2013-10-11 DIAGNOSIS — C7949 Secondary malignant neoplasm of other parts of nervous system: Secondary | ICD-10-CM

## 2013-10-11 DIAGNOSIS — R350 Frequency of micturition: Secondary | ICD-10-CM

## 2013-10-11 DIAGNOSIS — C3491 Malignant neoplasm of unspecified part of right bronchus or lung: Secondary | ICD-10-CM

## 2013-10-11 DIAGNOSIS — C7931 Secondary malignant neoplasm of brain: Secondary | ICD-10-CM

## 2013-10-11 DIAGNOSIS — Z5111 Encounter for antineoplastic chemotherapy: Secondary | ICD-10-CM

## 2013-10-11 LAB — CBC WITH DIFFERENTIAL/PLATELET
BASO%: 1.9 % (ref 0.0–2.0)
Basophils Absolute: 0.1 10*3/uL (ref 0.0–0.1)
EOS ABS: 0.1 10*3/uL (ref 0.0–0.5)
EOS%: 3.1 % (ref 0.0–7.0)
HCT: 32.6 % — ABNORMAL LOW (ref 38.4–49.9)
HGB: 10.8 g/dL — ABNORMAL LOW (ref 13.0–17.1)
LYMPH#: 0.9 10*3/uL (ref 0.9–3.3)
LYMPH%: 24.5 % (ref 14.0–49.0)
MCH: 30.7 pg (ref 27.2–33.4)
MCHC: 33.1 g/dL (ref 32.0–36.0)
MCV: 92.7 fL (ref 79.3–98.0)
MONO#: 0.5 10*3/uL (ref 0.1–0.9)
MONO%: 12.9 % (ref 0.0–14.0)
NEUT%: 57.6 % (ref 39.0–75.0)
NEUTROS ABS: 2 10*3/uL (ref 1.5–6.5)
Platelets: 232 10*3/uL (ref 140–400)
RBC: 3.52 10*6/uL — ABNORMAL LOW (ref 4.20–5.82)
RDW: 15.2 % — AB (ref 11.0–14.6)
WBC: 3.5 10*3/uL — AB (ref 4.0–10.3)

## 2013-10-11 LAB — COMPREHENSIVE METABOLIC PANEL (CC13)
ALT: 7 U/L (ref 0–55)
AST: 14 U/L (ref 5–34)
Albumin: 3.1 g/dL — ABNORMAL LOW (ref 3.5–5.0)
Alkaline Phosphatase: 90 U/L (ref 40–150)
Anion Gap: 9 mEq/L (ref 3–11)
BUN: 9.7 mg/dL (ref 7.0–26.0)
CO2: 24 mEq/L (ref 22–29)
Calcium: 9.8 mg/dL (ref 8.4–10.4)
Chloride: 102 mEq/L (ref 98–109)
Creatinine: 1.4 mg/dL — ABNORMAL HIGH (ref 0.7–1.3)
Glucose: 104 mg/dl (ref 70–140)
Potassium: 4.1 mEq/L (ref 3.5–5.1)
Sodium: 135 mEq/L — ABNORMAL LOW (ref 136–145)
Total Bilirubin: 0.55 mg/dL (ref 0.20–1.20)
Total Protein: 6.9 g/dL (ref 6.4–8.3)

## 2013-10-11 LAB — URINALYSIS, MICROSCOPIC - CHCC
Bilirubin (Urine): UNDETERMINED
Blood: NEGATIVE
Glucose: NEGATIVE mg/dL
Ketones: NEGATIVE mg/dL
Leukocyte Esterase: NEGATIVE
Nitrite: NEGATIVE
Protein: NEGATIVE mg/dL
RBC / HPF: NEGATIVE (ref 0–2)
Specific Gravity, Urine: 1.01 (ref 1.003–1.035)
Urobilinogen, UR: 0.2 mg/dL (ref 0.2–1)
WBC, UA: NEGATIVE (ref 0–2)
pH: 6 (ref 4.6–8.0)

## 2013-10-11 MED ORDER — DEXAMETHASONE SODIUM PHOSPHATE 20 MG/5ML IJ SOLN
INTRAMUSCULAR | Status: AC
  Filled 2013-10-11: qty 5

## 2013-10-11 MED ORDER — SODIUM CHLORIDE 0.9 % IV SOLN
500.0000 mg/m2 | Freq: Once | INTRAVENOUS | Status: AC
  Administered 2013-10-11: 1100 mg via INTRAVENOUS
  Filled 2013-10-11: qty 44

## 2013-10-11 MED ORDER — ONDANSETRON 16 MG/50ML IVPB (CHCC)
INTRAVENOUS | Status: AC
Start: 2013-10-11 — End: 2013-10-11
  Filled 2013-10-11: qty 16

## 2013-10-11 MED ORDER — CARBOPLATIN CHEMO INJECTION 600 MG/60ML
540.0000 mg | Freq: Once | INTRAVENOUS | Status: AC
  Administered 2013-10-11: 540 mg via INTRAVENOUS
  Filled 2013-10-11: qty 54

## 2013-10-11 MED ORDER — DEXAMETHASONE SODIUM PHOSPHATE 20 MG/5ML IJ SOLN
20.0000 mg | Freq: Once | INTRAMUSCULAR | Status: AC
  Administered 2013-10-11: 20 mg via INTRAVENOUS

## 2013-10-11 MED ORDER — ONDANSETRON 16 MG/50ML IVPB (CHCC)
16.0000 mg | Freq: Once | INTRAVENOUS | Status: AC
  Administered 2013-10-11: 16 mg via INTRAVENOUS

## 2013-10-11 MED ORDER — SODIUM CHLORIDE 0.9 % IV SOLN
Freq: Once | INTRAVENOUS | Status: AC
  Administered 2013-10-11: 10:00:00 via INTRAVENOUS

## 2013-10-11 MED ORDER — CARBOPLATIN CHEMO INTRADERMAL TEST DOSE 100MCG/0.02ML
100.0000 ug | Freq: Once | INTRADERMAL | Status: AC
  Administered 2013-10-11: 100 ug via INTRADERMAL
  Filled 2013-10-11: qty 0.01

## 2013-10-11 NOTE — Patient Instructions (Signed)
[facility] Discharge Instructions for Patients Receiving Chemotherapy  Today you received the following chemotherapy agents: Alimta and Carboplatin.  To help prevent nausea and vomiting after your treatment, we encourage you to take your nausea medication as prescribed.    If you develop nausea and vomiting that is not controlled by your nausea medication, call the clinic.   BELOW ARE SYMPTOMS THAT SHOULD BE REPORTED IMMEDIATELY:  *FEVER GREATER THAN 100.5 F  *CHILLS WITH OR WITHOUT FEVER  NAUSEA AND VOMITING THAT IS NOT CONTROLLED WITH YOUR NAUSEA MEDICATION  *UNUSUAL SHORTNESS OF BREATH  *UNUSUAL BRUISING OR BLEEDING  TENDERNESS IN MOUTH AND THROAT WITH OR WITHOUT PRESENCE OF ULCERS  *URINARY PROBLEMS  *BOWEL PROBLEMS  UNUSUAL RASH Items with * indicate a potential emergency and should be followed up as soon as possible.  Feel free to call the clinic you have any questions or concerns. The clinic phone number is [phone].

## 2013-10-11 NOTE — Progress Notes (Signed)
Pt reports frequency in urination over the past few days and states he thinks he has a UTI. Dr. Julien Nordmann notified and verbal order received for urinalysis.  Okay to proceed with [place] skin test before CMET results per MD.

## 2013-10-12 ENCOUNTER — Telehealth: Payer: Self-pay | Admitting: *Deleted

## 2013-10-12 ENCOUNTER — Ambulatory Visit
Admission: RE | Admit: 2013-10-12 | Discharge: 2013-10-12 | Disposition: A | Discharge status: Home / Self Care | Payer: BC Managed Care – PPO | Source: Ambulatory Visit | Attending: Radiation Oncology | Admitting: Radiation Oncology

## 2013-10-12 ENCOUNTER — Other Ambulatory Visit: Payer: Self-pay | Admitting: *Deleted

## 2013-10-12 ENCOUNTER — Encounter: Payer: Self-pay | Admitting: Radiation Oncology

## 2013-10-12 VITALS — BP 121/71 | HR 84 | Temp 98.2°F | Resp 16 | Ht 70.0 in | Wt 215.2 lb

## 2013-10-12 DIAGNOSIS — Z510 Encounter for antineoplastic radiation therapy: Secondary | ICD-10-CM | POA: Diagnosis present

## 2013-10-12 DIAGNOSIS — F17200 Nicotine dependence, unspecified, uncomplicated: Secondary | ICD-10-CM | POA: Insufficient documentation

## 2013-10-12 DIAGNOSIS — C7949 Secondary malignant neoplasm of other parts of nervous system: Secondary | ICD-10-CM

## 2013-10-12 DIAGNOSIS — I4891 Unspecified atrial fibrillation: Secondary | ICD-10-CM | POA: Diagnosis not present

## 2013-10-12 DIAGNOSIS — C3490 Malignant neoplasm of unspecified part of unspecified bronchus or lung: Secondary | ICD-10-CM | POA: Diagnosis not present

## 2013-10-12 DIAGNOSIS — R29898 Other symptoms and signs involving the musculoskeletal system: Secondary | ICD-10-CM | POA: Diagnosis not present

## 2013-10-12 DIAGNOSIS — Z923 Personal history of irradiation: Secondary | ICD-10-CM | POA: Insufficient documentation

## 2013-10-12 DIAGNOSIS — F1010 Alcohol abuse, uncomplicated: Secondary | ICD-10-CM | POA: Diagnosis not present

## 2013-10-12 DIAGNOSIS — C7931 Secondary malignant neoplasm of brain: Secondary | ICD-10-CM

## 2013-10-12 DIAGNOSIS — Z9221 Personal history of antineoplastic chemotherapy: Secondary | ICD-10-CM | POA: Diagnosis not present

## 2013-10-12 HISTORY — DX: Malignant neoplasm of unspecified part of unspecified bronchus or lung: C34.90

## 2013-10-12 MED ORDER — PROCHLORPERAZINE MALEATE 10 MG PO TABS: 10.0000 mg | ORAL_TABLET | Freq: Four times a day (QID) | ORAL | Status: DC | PRN

## 2013-10-12 NOTE — Telephone Encounter (Signed)
Spoke with pt today for post chemo follow up.  Pt stated he had this same chemo regimen before at [facility] clinic, and did well.  Pt denied nausea/vomiting;  Denied any adverse side effects.   Pt wanted to inform nurse of correct pharmacy.  Pt is now residing at [facility] on [address].  Per pt, any prescriptions will need to be faxed to [facility] , and the facility will order medications from their own pharmacy. Asked to speak to pt's nurse today Ria Comment.  This nurse wanted to give verbal order for antiemetic Compazine ( prescribed for pt on 10/07/13 ).  However, Ria Comment stated  " No  Verbal  Order " ; script will have to be faxed.  Colletta Maryland, desk nurse for Dr. Julien Nordmann notified. [employer]      [phone]     ;    Fax       [phone].

## 2013-10-12 NOTE — Progress Notes (Signed)
Please see the Nurse Progress Note in the MD Initial Consult Encounter for this patient. 

## 2013-10-12 NOTE — Progress Notes (Signed)
Radiation Oncology         [phone] _____________________ ___________  Initial outpatient Consultation  Name: [name] MRN: [id number]  Date: 10/12/2013  DOB: 08-28-1960  PY:[id number],IWP A, MD  Curt Bears, MD   REFERRING PHYSICIAN: Curt Bears, MD  DIAGNOSIS: Stage IV adenocarcinoma of the lung  HISTORY OF PRESENT ILLNESS::Bryan Rice is a 53 y.o. male who is seen out courtesy of Dr. Julien Nordmann for an opinion concerning radiation therapy as part of management of patient's advanced lung cancer. Last year the patient was diagnosed with stage IV adenocarcinoma of the lung. He is living in the [place] area and proceeded with treatment in that area. Patient received chemotherapy with a good response to treatment. 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 According to patient he received 10 treatments to the whole brain. More recently the patient developed weakness in his right leg and was found to have significant anticoagulation. He reportedly had bleeding from his tumors within the brain resulting in his leg weakness. Patient now lives in a skilled nursing facility in the [place] area. He had transferred since oncologic care to this area. The patient was seen by Dr. Earlie Server August 14. In light of the recurrence of brain metastasis patient is now seen in radiatiion oncology for consideration for additional treatment. Patient also has progressive disease within the body area on recent CT.  The patient will be starting back on chemotherapy.  PREVIOUS RADIATION THERAPY: Yes whole brain radiation therapy he reported delayed 30 gray in 10 fractions  PAST MEDICAL HISTORY:  has a past medical history of Alcohol abuse; Tobacco abuse; Atrial fibrillation; Chronic systolic heart failure; Arthritis; Gout; Cardiomyopathy; DM2 (diabetes mellitus, type 2); and Lung cancer.    PAST SURGICAL HISTORY: Past  Surgical History  Procedure Laterality Date  . Cardioversion  01/20/2011    Procedure: CARDIOVERSION;  Surgeon: Fay Records, MD;  Location: [place];  Service: Cardiovascular;  Laterality: N/A;  . Tee without cardioversion  01/20/2011    Procedure: TRANSESOPHAGEAL ECHOCARDIOGRAM (TEE);  Surgeon: Fay Records, MD;  Location: [place];  Service: Cardiovascular;  Laterality: N/A;  . Cardioversion  01/20/2011    Procedure: CARDIOVERSION;  Surgeon: Fay Records, MD;  Location: [place];  Service: Cardiovascular;  Laterality: N/A;  . Cardioversion  02/27/2011    Procedure: CARDIOVERSION;  Surgeon: Lelon Perla, MD;  Location: [place];  Service: Cardiovascular;  Laterality: N/A;    FAMILY HISTORY: family history includes Diabetes in his father; Leukemia in his paternal aunt; Peripheral vascular disease in his mother. There is no history of Anesthesia problems, Hypotension, Malignant hyperthermia, or Pseudochol deficiency.  SOCIAL HISTORY:  reports that he has been smoking Cigarettes.  He has a 70 pack-year smoking history. He does not have any smokeless tobacco history on file. He reports that he drinks about .6 ounces of alcohol per week. He reports that he does not use illicit drugs.  ALLERGIES: Review of patient's allergies indicates no known allergies.  MEDICATIONS:  Current Outpatient Prescriptions  Medication Sig Dispense Refill  . acetaminophen (TYLENOL) 325 MG tablet Take 650 mg by mouth every 6 (six) hours as needed.      Marland Kitchen amiodarone (PACERONE) 200 MG tablet Take 200 mg by mouth daily.      . bisacodyl (DULCOLAX) 5 MG EC tablet Take 5 mg by mouth daily as needed for moderate constipation.      Marland Kitchen  dexamethasone (DECADRON) 4 MG tablet 4 mg po bid the day before, day of and day after chemotherapy every 3 weeks  40 tablet  1  . folic acid (FOLVITE) 1 MG tablet Take 1 tablet (1 mg total) by mouth daily.  30 tablet  3  . prochlorperazine (COMPAZINE) 10 MG tablet Take 1 tablet (10 mg  total) by mouth every 6 (six) hours as needed for nausea or vomiting.  30 tablet  0   No current facility-administered medications for this encounter.    REVIEW OF SYSTEMS:  A 15 point review of systems is documented in the electronic medical record. This was obtained by the nursing staff. However, I reviewed this with the patient to discuss relevant findings and make appropriate changes. No reports of headache or visual problems. Patient continues to have some right lower extremity weakness but is able to stand for short periods of time. He denies any pain within the chest area or hemoptysis. Does have episodes of significant coughing.   PHYSICAL EXAM:  height is 5\' 10"  (1.778 m) and weight is 215 lb 3.2 oz (97.614 kg). His oral temperature is 98.2 F (36.8 C). His blood pressure is 121/71 and his pulse is 84. His respiration is 16 and oxygen saturation is 100%.  This is a very pleasant 53 year old gentleman in no acute distress. He is sitting in a hospital where a chair for the evaluation. Patient is alert and responds appropriately to questions. He is quite informative about his previous therapy. evaluation of the pupils reveals them to be equal round reactive to light. Extraocular eye movements are intact. The tongue is midline. There is no secondary infection noted the oral cavity or posterior pharynx. No palpable cervical supraclavicular or axillary adenopathy. The lungs are clear to auscultation. The heart has a regular rhythm and rate. The abdomen is soft and nontender with normal bowel sounds. On neurological examination the patient has some right lower extremity weakness more so in the proximal muscle groups. Upper by strength is 5 out of 5 in the proximal and distal muscle groups. The patient has slight weakness in his left lower extremity.   ECOG = 2  2 - Symptomatic, <50% in bed during the day (Ambulatory and capable of all self care but unable to carry out any work activities. Up and about  more than 50% of waking hours)  LABORATORY DATA:  Lab Results  Component Value Date   WBC 3.5* 10/11/2013   HGB 10.8* 10/11/2013   HCT 32.6* 10/11/2013   MCV 92.7 10/11/2013   PLT 232 10/11/2013   NEUTROABS 2.0 10/11/2013   Lab Results  Component Value Date   NA 135* 10/11/2013   K 4.1 10/11/2013   CL 100 06/06/2013   CO2 24 10/11/2013   GLUCOSE 104 10/11/2013   CREATININE 1.4* 10/11/2013   CALCIUM 9.8 10/11/2013      RADIOGRAPHY: Ct Chest W Contrast  10/10/2013   CLINICAL DATA:  Metastatic right lung cancer diagnosed in 2014. Osseous metastatic disease diagnosed 1 month ago. Restarting chemotherapy. Completed radiation therapy to the brain. Cough. Weight loss. Constipation.  EXAM: CT CHEST, ABDOMEN, AND PELVIS WITH CONTRAST  TECHNIQUE: Multidetector CT imaging of the chest, abdomen and pelvis was performed following the standard protocol during bolus administration of intravenous contrast.  CONTRAST:  157mL OMNIPAQUE IOHEXOL 300 MG/ML  SOLN  COMPARISON:  09/13/2012  FINDINGS: CT CHEST FINDINGS  Considerable mediastinal adenopathy is observed along with right hilar adenopathy and bilateral infrahilar adenopathy. Index  AP window lymph node 2 cm in short axis dimension. Index right hilar node 1.7 cm in short axis dimension. The right hilar adenopathy causes mild extrinsic narrowing of right upper lobe pulmonary vasculature.  Superior segment right lower lobe nodule, 2.9 x 2.2 x 2.4 cm, with mild adjacent atelectasis.  Numerous old left-sided rib fractures are present, some demonstrating nonunion. Old healed right posterior rib fractures are also present.  There is airspace opacity in a bandlike configuration in the right upper lobe with considerable airway thickening in the right upper lobe and a lesser degree of airway thickening in the right lower lobe.  4 mm left lower lobe nodule, image 42 series 5. Scattered faint nodularity in the right upper lobe.  CT ABDOMEN AND PELVIS FINDINGS  Right adrenal mass,  5.4 x 2.5 cm.  Left adrenal mass, 3.6 x 2.8 cm.  The liver, spleen, and pancreas appear unremarkable. A nodule peripheral to the right posterior perirenal fascia measures 1 cm on image 77 of series 2.  Aortoiliac atherosclerotic vascular disease. Right external iliac node 1.3 cm in short axis, image 106 of series 2. Heterogeneous right inguinal lymph node 1.5 cm, image 124 of series 2. Fatty bilateral spermatic cords. Urinary bladder unremarkable. Sclerosis in both femoral heads compatible with avascular necrosis. Spurring of both sacroiliac joints.  9 mm of anterolisthesis at L5-S1 with associated bilateral pars defects and severe right and moderate left foraminal stenosis. Dextroconvex upper lumbar scoliosis with left foraminal spurring at the L1-2 level causing foraminal impingement.  The reported prior osseous metastatic disease is not well observed in the chest, abdomen, or bony pelvis -if prior exams can be provided we are happy to issue a comparison.  IMPRESSION: 1. And large superior segment right lower lobe nodule with considerable mediastinal adenopathy, right hilar adenopathy, bilateral infrahilar adenopathy, bilateral adrenal mass is suspicious for metastatic disease, a small left lower lobe pulmonary nodule, and a small lymph node or nodule posterior to the right posterior perirenal fascia. There is also pathologic right external iliac and right inguinal adenopathy. 2. Ancillary findings include atherosclerosis, lumbar scoliosis and spondylosis with pars defects at L5 and foraminal impingement at L1-2 and L5-S1, bilateral hip avascular necrosis, and airspace opacity in a bandlike configuration in the right upper lobe which could be from pneumonia, unusual spread of malignancy, or most likely radiation pneumonitis given the associated airway thickening.   Electronically Signed   By: Sherryl Barters M.D.   On: 10/10/2013 14:08       IMPRESSION: Stage IV adenocarcinoma of lung. Reports the patient has  progressive disease in the brain area. Patient's images were obtained at an outside institution and have been requested for review. pending on results of this scans the patient may be a potential candidate for stereotactic radiosurgery. Given the short interval since the patient's previous whole brain radiation therapy I would be less hesitant in considering retreatment with whole brain radiation therapy.  PLAN: Pending review of the patient's MRI,  the patient will be set up for stereotactic radiosurgery evaluation.  I spent 60 minutes minutes face to face with the patient and more than 50% of that time was spent in counseling and/or coordination of care.   ------------------------------------------------  Blair Promise, PhD, MD

## 2013-10-13 ENCOUNTER — Telehealth: Payer: Self-pay | Admitting: *Deleted

## 2013-10-13 NOTE — Telephone Encounter (Signed)
Late entry 10/12/13:  rx for compazine faxed to [facility].  SLJ

## 2013-10-17 ENCOUNTER — Telehealth: Payer: Self-pay | Admitting: Internal Medicine

## 2013-10-17 NOTE — Telephone Encounter (Signed)
returned pt call and r/s appt due to no transportation...pt ok and aware of new d.t

## 2013-10-18 ENCOUNTER — Other Ambulatory Visit: Payer: Self-pay | Admitting: Internal Medicine

## 2013-10-18 ENCOUNTER — Other Ambulatory Visit: Payer: BC Managed Care – PPO

## 2013-10-18 ENCOUNTER — Inpatient Hospital Stay
Admission: RE | Admit: 2013-10-18 | Discharge: 2013-10-18 | Disposition: A | Discharge status: Home / Self Care | Payer: Self-pay | Source: Ambulatory Visit | Attending: Internal Medicine | Admitting: Internal Medicine

## 2013-10-18 ENCOUNTER — Ambulatory Visit: Payer: BC Managed Care – PPO | Admitting: Physician Assistant

## 2013-10-18 DIAGNOSIS — C3490 Malignant neoplasm of unspecified part of unspecified bronchus or lung: Secondary | ICD-10-CM

## 2013-10-20 ENCOUNTER — Telehealth: Payer: Self-pay | Admitting: Internal Medicine

## 2013-10-20 NOTE — Telephone Encounter (Signed)
Faxed pt medical records to [facility]

## 2013-10-21 ENCOUNTER — Other Ambulatory Visit: Payer: Self-pay | Admitting: Radiation Therapy

## 2013-10-21 ENCOUNTER — Encounter: Payer: Self-pay | Admitting: Physician Assistant

## 2013-10-21 ENCOUNTER — Ambulatory Visit (HOSPITAL_BASED_OUTPATIENT_CLINIC_OR_DEPARTMENT_OTHER): Payer: BC Managed Care – PPO | Admitting: Physician Assistant

## 2013-10-21 ENCOUNTER — Other Ambulatory Visit (HOSPITAL_BASED_OUTPATIENT_CLINIC_OR_DEPARTMENT_OTHER): Payer: BC Managed Care – PPO

## 2013-10-21 ENCOUNTER — Telehealth: Payer: Self-pay | Admitting: Internal Medicine

## 2013-10-21 ENCOUNTER — Other Ambulatory Visit: Payer: Self-pay

## 2013-10-21 VITALS — BP 120/70 | HR 89 | Temp 98.2°F | Resp 19 | Ht 70.0 in | Wt 202.6 lb

## 2013-10-21 DIAGNOSIS — D702 Other drug-induced agranulocytosis: Secondary | ICD-10-CM

## 2013-10-21 DIAGNOSIS — C3490 Malignant neoplasm of unspecified part of unspecified bronchus or lung: Secondary | ICD-10-CM

## 2013-10-21 DIAGNOSIS — C7949 Secondary malignant neoplasm of other parts of nervous system: Principal | ICD-10-CM

## 2013-10-21 DIAGNOSIS — D709 Neutropenia, unspecified: Secondary | ICD-10-CM

## 2013-10-21 DIAGNOSIS — C7931 Secondary malignant neoplasm of brain: Secondary | ICD-10-CM

## 2013-10-21 DIAGNOSIS — T451X5A Adverse effect of antineoplastic and immunosuppressive drugs, initial encounter: Secondary | ICD-10-CM

## 2013-10-21 DIAGNOSIS — D701 Agranulocytosis secondary to cancer chemotherapy: Secondary | ICD-10-CM

## 2013-10-21 DIAGNOSIS — C3491 Malignant neoplasm of unspecified part of right bronchus or lung: Secondary | ICD-10-CM

## 2013-10-21 LAB — CBC WITH DIFFERENTIAL/PLATELET
BASO%: 1.4 % (ref 0.0–2.0)
BASOS ABS: 0 10*3/uL (ref 0.0–0.1)
EOS ABS: 0 10*3/uL (ref 0.0–0.5)
EOS%: 5.6 % (ref 0.0–7.0)
HCT: 29.5 % — ABNORMAL LOW (ref 38.4–49.9)
HEMOGLOBIN: 10 g/dL — AB (ref 13.0–17.1)
LYMPH%: 75 % — AB (ref 14.0–49.0)
MCH: 30.5 pg (ref 27.2–33.4)
MCHC: 33.9 g/dL (ref 32.0–36.0)
MCV: 89.9 fL (ref 79.3–98.0)
MONO#: 0 10*3/uL — ABNORMAL LOW (ref 0.1–0.9)
MONO%: 1.4 % (ref 0.0–14.0)
NEUT#: 0.1 10*3/uL — CL (ref 1.5–6.5)
NEUT%: 16.6 % — AB (ref 39.0–75.0)
PLATELETS: 67 10*3/uL — AB (ref 140–400)
RBC: 3.28 10*6/uL — ABNORMAL LOW (ref 4.20–5.82)
RDW: 13.3 % (ref 11.0–14.6)
WBC: 0.7 10*3/uL — CL (ref 4.0–10.3)
lymph#: 0.5 10*3/uL — ABNORMAL LOW (ref 0.9–3.3)

## 2013-10-21 LAB — COMPREHENSIVE METABOLIC PANEL (CC13)
ALK PHOS: 93 U/L (ref 40–150)
ALT: 22 U/L (ref 0–55)
AST: 23 U/L (ref 5–34)
Albumin: 3.2 g/dL — ABNORMAL LOW (ref 3.5–5.0)
Anion Gap: 8 mEq/L (ref 3–11)
BILIRUBIN TOTAL: 0.99 mg/dL (ref 0.20–1.20)
BUN: 14.3 mg/dL (ref 7.0–26.0)
CO2: 23 mEq/L (ref 22–29)
CREATININE: 1.1 mg/dL (ref 0.7–1.3)
Calcium: 9.6 mg/dL (ref 8.4–10.4)
Chloride: 103 mEq/L (ref 98–109)
GLUCOSE: 97 mg/dL (ref 70–140)
Potassium: 3.9 mEq/L (ref 3.5–5.1)
Sodium: 135 mEq/L — ABNORMAL LOW (ref 136–145)
Total Protein: 7.1 g/dL (ref 6.4–8.3)

## 2013-10-21 MED ORDER — PROCHLORPERAZINE MALEATE 10 MG PO TABS: 10.0000 mg | ORAL_TABLET | Freq: Four times a day (QID) | ORAL | Status: AC | PRN

## 2013-10-21 MED ORDER — FOLIC ACID 1 MG PO TABS
1.0000 mg | ORAL_TABLET | Freq: Every day | ORAL | Status: DC
Start: 2013-10-21 — End: 2014-02-09

## 2013-10-21 MED ORDER — LEVOFLOXACIN 500 MG PO TABS: 500.0000 mg | ORAL_TABLET | Freq: Every day | ORAL | Status: DC

## 2013-10-21 MED ORDER — DEXAMETHASONE 4 MG PO TABS: ORAL_TABLET | ORAL | Status: DC

## 2013-10-21 MED ORDER — TBO-FILGRASTIM 480 MCG/0.8ML ~~LOC~~ SOSY
480.0000 ug | PREFILLED_SYRINGE | Freq: Once | SUBCUTANEOUS | Status: AC
  Administered 2013-10-21: 480 ug via SUBCUTANEOUS
  Filled 2013-10-21: qty 0.8

## 2013-10-21 NOTE — Patient Instructions (Addendum)

## 2013-10-21 NOTE — Progress Notes (Signed)
No images are attached to the encounter. No scans are attached to the encounter. No scans are attached to the encounter. [facility] SHARED VISIT PROGRESS NOTE  Curly Rim, MD [address] [place] [id number]  DIAGNOSIS: Lung cancer, primary, with metastasis from lung to other site   Primary site: Lung (Right)   Staging method: AJCC 7th Edition   Clinical: Stage IV (T1b, N2, M1b) signed by Curt Bears, MD on 10/07/2013  9:33 PM   Summary: Stage IV (T1b, N2, M1b)  PRIOR THERAPY:   CURRENT THERAPY: Systemic chemotherapy with carboplatin for an AUC of 5 and Alimta 500 mg per meter squared given every 3 weeks. Status post 1 cycle.  INTERVAL HISTORY: Bryan Rice 53 y.o. male returns for a scheduled regular symptom management visit for followup of his metastatic non small cell lung cancer, adenocarcinoma. He is status post 1 cycle of systemic chemotherapy with  Carboplatin and Alimta. He tolerated the chemotherapy without difficulty. He continues to smoke 6 -10 cigarettes per day, down from 3 packs per day. He denied chest pain, shortness of breath, cough, or hemoptysis. He denied any significant weight loss or night sweats.   MEDICAL HISTORY: Past Medical History  Diagnosis Date  . Alcohol abuse   . Tobacco abuse   . Atrial fibrillation     s/p TEE-DCCV 11/12;  amiodarone started 02/03/11  . Chronic systolic heart failure   . Arthritis     left elbow  . Gout   . Cardiomyopathy     In the setting of afib with rvr 11/12;  Echo 01/19/11:  Diffuse HK worse in Inf wall and septum, mod LVE, mild LVH, EF 30%, mild BAE  . DM2 (diabetes mellitus, type 2)     Hemoglobin A1c 7.1 in 11/12/pt.states he's not diabetic  . Lung cancer     ALLERGIES:  has No Known Allergies.  MEDICATIONS:  Current Outpatient Prescriptions  Medication Sig Dispense Refill  . acetaminophen (TYLENOL) 325 MG tablet Take 650 mg by mouth every 6 (six) hours as needed.      Marland Kitchen  amiodarone (PACERONE) 200 MG tablet Take 200 mg by mouth daily.      . bisacodyl (DULCOLAX) 5 MG EC tablet Take 5 mg by mouth daily as needed for moderate constipation.      Marland Kitchen dexamethasone (DECADRON) 4 MG tablet 4 mg po bid the day before, day of and day after chemotherapy every 3 weeks  40 tablet  1  . folic acid (FOLVITE) 1 MG tablet Take 1 tablet (1 mg total) by mouth daily.  30 tablet  3  . levofloxacin (LEVAQUIN) 500 MG tablet Take 1 tablet (500 mg total) by mouth daily.  5 tablet  0  . prochlorperazine (COMPAZINE) 10 MG tablet Take 1 tablet (10 mg total) by mouth every 6 (six) hours as needed for nausea or vomiting.  30 tablet  0   No current facility-administered medications for this visit.    SURGICAL HISTORY:  Past Surgical History  Procedure Laterality Date  . Cardioversion  01/20/2011    Procedure: CARDIOVERSION;  Surgeon: Fay Records, MD;  Location: [facility];  Service: Cardiovascular;  Laterality: N/A;  . Tee without cardioversion  01/20/2011    Procedure: TRANSESOPHAGEAL ECHOCARDIOGRAM (TEE);  Surgeon: Fay Records, MD;  Location: [facility] ENDOSCOPY;  Service: Cardiovascular;  Laterality: N/A;  . Cardioversion  01/20/2011    Procedure: CARDIOVERSION;  Surgeon: Fay Records, MD;  Location: [facility] ENDOSCOPY;  Service: Cardiovascular;  Laterality: N/A;  . Cardioversion  02/27/2011    Procedure: CARDIOVERSION;  Surgeon: Lelon Perla, MD;  Location: [facility] OR;  Service: Cardiovascular;  Laterality: N/A;    REVIEW OF SYSTEMS:  A comprehensive review of systems was negative.   PHYSICAL EXAMINATION: General appearance: alert, cooperative, appears stated age and no distress Head: Normocephalic, without obvious abnormality, atraumatic Neck: no adenopathy, no carotid bruit, no JVD, supple, symmetrical, trachea midline and thyroid not enlarged, symmetric, no tenderness/mass/nodules Lymph nodes: Cervical, supraclavicular, and axillary nodes normal. Resp: clear to auscultation bilaterally Back:  symmetric, no curvature. ROM normal. No CVA tenderness. Cardio: regular rate and rhythm, S1, S2 normal, no murmur, click, rub or gallop GI: soft, non-tender; bowel sounds normal; no masses,  no organomegaly Extremities: extremities normal, atraumatic, no cyanosis or edema Neurologic: Alert and oriented X 3, normal strength and tone. Normal symmetric reflexes. Normal coordination and gait  ECOG PERFORMANCE STATUS: 0 - Asymptomatic  Blood pressure 120/70, pulse 89, temperature 98.2 F (36.8 C), temperature source Oral, resp. rate 19, height 5\' 10"  (1.778 m), weight 202 lb 9.6 oz (91.899 kg).  LABORATORY DATA: Lab Results  Component Value Date   WBC 0.7* 10/21/2013   HGB 10.0* 10/21/2013   HCT 29.5* 10/21/2013   MCV 89.9 10/21/2013   PLT 67* 10/21/2013      Chemistry      Component Value Date/Time   NA 135* 10/21/2013 1048   NA 135 06/06/2013 1643   K 3.9 10/21/2013 1048   K 4.3 06/06/2013 1643   CL 100 06/06/2013 1643   CO2 23 10/21/2013 1048   CO2 26 06/06/2013 1643   BUN 14.3 10/21/2013 1048   BUN 14 06/06/2013 1643   CREATININE 1.1 10/21/2013 1048   CREATININE 1.1 06/06/2013 1643      Component Value Date/Time   CALCIUM 9.6 10/21/2013 1048   CALCIUM 9.4 06/06/2013 1643   ALKPHOS 93 10/21/2013 1048   ALKPHOS 79 06/06/2013 1643   AST 23 10/21/2013 1048   AST 20 06/06/2013 1643   ALT 22 10/21/2013 1048   ALT 19 06/06/2013 1643   BILITOT 0.99 10/21/2013 1048   BILITOT 0.5 06/06/2013 1643       RADIOGRAPHIC STUDIES:  Ct Chest W Contrast  10/10/2013   CLINICAL DATA:  Metastatic right lung cancer diagnosed in 2014. Osseous metastatic disease diagnosed 1 month ago. Restarting chemotherapy. Completed radiation therapy to the brain. Cough. Weight loss. Constipation.  EXAM: CT CHEST, ABDOMEN, AND PELVIS WITH CONTRAST  TECHNIQUE: Multidetector CT imaging of the chest, abdomen and pelvis was performed following the standard protocol during bolus administration of intravenous contrast.  CONTRAST:   149mL OMNIPAQUE IOHEXOL 300 MG/ML  SOLN  COMPARISON:  09/13/2012  FINDINGS: CT CHEST FINDINGS  Considerable mediastinal adenopathy is observed along with right hilar adenopathy and bilateral infrahilar adenopathy. Index AP window lymph node 2 cm in short axis dimension. Index right hilar node 1.7 cm in short axis dimension. The right hilar adenopathy causes mild extrinsic narrowing of right upper lobe pulmonary vasculature.  Superior segment right lower lobe nodule, 2.9 x 2.2 x 2.4 cm, with mild adjacent atelectasis.  Numerous old left-sided rib fractures are present, some demonstrating nonunion. Old healed right posterior rib fractures are also present.  There is airspace opacity in a bandlike configuration in the right upper lobe with considerable airway thickening in the right upper lobe and a lesser degree of airway thickening in the right lower lobe.  4 mm left lower lobe nodule, image 42 series 5. Scattered faint nodularity in the right upper lobe.  CT ABDOMEN AND PELVIS FINDINGS  Right adrenal mass, 5.4 x 2.5 cm.  Left adrenal mass, 3.6 x 2.8 cm.  The liver, spleen, and pancreas appear unremarkable. A nodule peripheral to the right posterior perirenal fascia measures 1 cm on image 77 of series 2.  Aortoiliac atherosclerotic vascular disease. Right external iliac node 1.3 cm in short axis, image 106 of series 2. Heterogeneous right inguinal lymph node 1.5 cm, image 124 of series 2. Fatty bilateral spermatic cords. Urinary bladder unremarkable. Sclerosis in both femoral heads compatible with avascular necrosis. Spurring of both sacroiliac joints.  9 mm of anterolisthesis at L5-S1 with associated bilateral pars defects and severe right and moderate left foraminal stenosis. Dextroconvex upper lumbar scoliosis with left foraminal spurring at the L1-2 level causing foraminal impingement.  The reported prior osseous metastatic disease is not well observed in the chest, abdomen, or bony pelvis -if prior exams can be  provided we are happy to issue a comparison.  IMPRESSION: 1. And large superior segment right lower lobe nodule with considerable mediastinal adenopathy, right hilar adenopathy, bilateral infrahilar adenopathy, bilateral adrenal mass is suspicious for metastatic disease, a small left lower lobe pulmonary nodule, and a small lymph node or nodule posterior to the right posterior perirenal fascia. There is also pathologic right external iliac and right inguinal adenopathy. 2. Ancillary findings include atherosclerosis, lumbar scoliosis and spondylosis with pars defects at L5 and foraminal impingement at L1-2 and L5-S1, bilateral hip avascular necrosis, and airspace opacity in a bandlike configuration in the right upper lobe which could be from pneumonia, unusual spread of malignancy, or most likely radiation pneumonitis given the associated airway thickening.   Electronically Signed   By: Sherryl Barters M.D.   On: 10/10/2013 14:08   Ct Abdomen Pelvis W Contrast  10/10/2013   CLINICAL DATA:  Metastatic right lung cancer diagnosed in 2014. Osseous metastatic disease diagnosed 1 month ago. Restarting chemotherapy. Completed radiation therapy to the brain. Cough. Weight loss. Constipation.  EXAM: CT CHEST, ABDOMEN, AND PELVIS WITH CONTRAST  TECHNIQUE: Multidetector CT imaging of the chest, abdomen and pelvis was performed following the standard protocol during bolus administration of intravenous contrast.  CONTRAST:  120mL OMNIPAQUE IOHEXOL 300 MG/ML  SOLN  COMPARISON:  09/13/2012  FINDINGS: CT CHEST FINDINGS  Considerable mediastinal adenopathy is observed along with right hilar adenopathy and bilateral infrahilar adenopathy. Index AP window lymph node 2 cm in short axis dimension. Index right hilar node 1.7 cm in short axis dimension. The right hilar adenopathy causes mild extrinsic narrowing of right upper lobe pulmonary vasculature.  Superior segment right lower lobe nodule, 2.9 x 2.2 x 2.4 cm, with mild adjacent  atelectasis.  Numerous old left-sided rib fractures are present, some demonstrating nonunion. Old healed right posterior rib fractures are also present.  There is airspace opacity in a bandlike configuration in the right upper lobe with considerable airway thickening in the right upper lobe and a lesser degree of airway thickening in the right lower lobe.  4 mm left lower lobe nodule, image 42 series 5. Scattered faint nodularity in the right upper lobe.  CT ABDOMEN AND PELVIS FINDINGS  Right adrenal mass, 5.4 x 2.5 cm.  Left adrenal mass, 3.6 x 2.8 cm.  The liver, spleen, and pancreas appear unremarkable. A nodule peripheral to the right posterior perirenal fascia measures 1 cm on image 77 of series 2.  Aortoiliac atherosclerotic vascular disease. Right external iliac node 1.3 cm in short axis, image 106 of series 2. Heterogeneous right inguinal lymph node 1.5 cm, image 124 of series 2. Fatty bilateral spermatic cords. Urinary bladder unremarkable. Sclerosis in both femoral heads compatible with avascular necrosis. Spurring of both sacroiliac joints.  9 mm of anterolisthesis at L5-S1 with associated bilateral pars defects and severe right and moderate left foraminal stenosis. Dextroconvex upper lumbar scoliosis with left foraminal spurring at the L1-2 level causing foraminal impingement.  The reported prior osseous metastatic disease is not well observed in the chest, abdomen, or bony pelvis -if prior exams can be provided we are happy to issue a comparison.  IMPRESSION: 1. And large superior segment right lower lobe nodule with considerable mediastinal adenopathy, right hilar adenopathy, bilateral infrahilar adenopathy, bilateral adrenal mass is suspicious for metastatic disease, a small left lower lobe pulmonary nodule, and a small lymph node or nodule posterior to the right posterior perirenal fascia. There is also pathologic right external iliac and right inguinal adenopathy. 2. Ancillary findings include  atherosclerosis, lumbar scoliosis and spondylosis with pars defects at L5 and foraminal impingement at L1-2 and L5-S1, bilateral hip avascular necrosis, and airspace opacity in a bandlike configuration in the right upper lobe which could be from pneumonia, unusual spread of malignancy, or most likely radiation pneumonitis given the associated airway thickening.   Electronically Signed   By: Sherryl Barters M.D.   On: 10/10/2013 14:08     ASSESSMENT/PLAN: The patient is a pleasant 53 year old Caucasian male with metastatic non small cell lung cancer, adenocarcinoma. He is status post 1 cycle of chemotherapy with carboplatin and alimta. He is neutropenic today with a total white count of 0.7 and ANC of 0.1.He is afebrile. The patient was discussed with Dr. Julien Nordmann. The patient was also discussed with Dr. Dorris Carnes, his cardiologist. He will be be given Granix 480 mcg x 2 daily injections. For antibiotic coverage, he will be placed on a five day course of Levaquin 500 mg. This antibiotic was cleared by Dr. Harrington Challenger given his amiodarone therapy, after an EKG was done. His folic acid, compazine and dexamethasone were rewritten so that he can have them filled at his skilled nursing facility. He will continue with weekly labs as scheduled. He will follow up in 2 weeks, prior to the start of cycle # 2.     Wynetta Emery, Quavis Klutz E, PA-C     All questions were answered. The patient knows to call the clinic with any problems, questions or concerns. We can certainly see the patient much sooner if necessary.

## 2013-10-21 NOTE — Telephone Encounter (Signed)
gv adn printed appt sched adn avs for pt for SEpt....sed added tx....emailed AJ and MM for appt for 9.8.Marland KitchenMarland Kitchenpt can not come on fridays

## 2013-10-22 ENCOUNTER — Ambulatory Visit (HOSPITAL_BASED_OUTPATIENT_CLINIC_OR_DEPARTMENT_OTHER): Payer: BC Managed Care – PPO

## 2013-10-22 VITALS — BP 110/76 | HR 93 | Temp 98.1°F | Resp 18

## 2013-10-22 DIAGNOSIS — D701 Agranulocytosis secondary to cancer chemotherapy: Secondary | ICD-10-CM

## 2013-10-22 DIAGNOSIS — T451X5A Adverse effect of antineoplastic and immunosuppressive drugs, initial encounter: Principal | ICD-10-CM

## 2013-10-22 DIAGNOSIS — D709 Neutropenia, unspecified: Secondary | ICD-10-CM

## 2013-10-22 MED ORDER — TBO-FILGRASTIM 480 MCG/0.8ML ~~LOC~~ SOSY
480.0000 ug | PREFILLED_SYRINGE | Freq: Once | SUBCUTANEOUS | Status: AC
  Administered 2013-10-22: 480 ug via SUBCUTANEOUS

## 2013-10-22 NOTE — Patient Instructions (Signed)

## 2013-10-24 ENCOUNTER — Telehealth: Payer: Self-pay | Admitting: Internal Medicine

## 2013-10-24 NOTE — Telephone Encounter (Signed)
lvm for pt regarding to Aug and SEpt appt

## 2013-10-25 ENCOUNTER — Other Ambulatory Visit: Payer: Self-pay | Admitting: Internal Medicine

## 2013-10-25 ENCOUNTER — Ambulatory Visit (HOSPITAL_BASED_OUTPATIENT_CLINIC_OR_DEPARTMENT_OTHER): Payer: BC Managed Care – PPO

## 2013-10-25 ENCOUNTER — Telehealth: Payer: Self-pay | Admitting: Internal Medicine

## 2013-10-25 ENCOUNTER — Telehealth: Payer: Self-pay | Admitting: Medical Oncology

## 2013-10-25 ENCOUNTER — Other Ambulatory Visit (HOSPITAL_BASED_OUTPATIENT_CLINIC_OR_DEPARTMENT_OTHER): Payer: BC Managed Care – PPO

## 2013-10-25 VITALS — BP 117/71 | HR 85 | Temp 97.9°F

## 2013-10-25 DIAGNOSIS — C3490 Malignant neoplasm of unspecified part of unspecified bronchus or lung: Secondary | ICD-10-CM

## 2013-10-25 DIAGNOSIS — D709 Neutropenia, unspecified: Secondary | ICD-10-CM

## 2013-10-25 DIAGNOSIS — C3491 Malignant neoplasm of unspecified part of right bronchus or lung: Secondary | ICD-10-CM

## 2013-10-25 LAB — COMPREHENSIVE METABOLIC PANEL (CC13)
ALT: 20 U/L (ref 0–55)
ANION GAP: 9 meq/L (ref 3–11)
AST: 17 U/L (ref 5–34)
Albumin: 2.9 g/dL — ABNORMAL LOW (ref 3.5–5.0)
Alkaline Phosphatase: 87 U/L (ref 40–150)
BILIRUBIN TOTAL: 0.52 mg/dL (ref 0.20–1.20)
BUN: 13.2 mg/dL (ref 7.0–26.0)
CALCIUM: 9.2 mg/dL (ref 8.4–10.4)
CHLORIDE: 102 meq/L (ref 98–109)
CO2: 23 meq/L (ref 22–29)
CREATININE: 1.1 mg/dL (ref 0.7–1.3)
GLUCOSE: 98 mg/dL (ref 70–140)
Potassium: 4 mEq/L (ref 3.5–5.1)
Sodium: 134 mEq/L — ABNORMAL LOW (ref 136–145)
TOTAL PROTEIN: 6.9 g/dL (ref 6.4–8.3)

## 2013-10-25 LAB — CBC WITH DIFFERENTIAL/PLATELET
BASO%: 0 % (ref 0.0–2.0)
Basophils Absolute: 0 10*3/uL (ref 0.0–0.1)
EOS ABS: 0 10*3/uL (ref 0.0–0.5)
EOS%: 4.8 % (ref 0.0–7.0)
HEMATOCRIT: 24.6 % — AB (ref 38.4–49.9)
HGB: 8.5 g/dL — ABNORMAL LOW (ref 13.0–17.1)
LYMPH%: 57.1 % — ABNORMAL HIGH (ref 14.0–49.0)
MCH: 30.1 pg (ref 27.2–33.4)
MCHC: 34.6 g/dL (ref 32.0–36.0)
MCV: 87.2 fL (ref 79.3–98.0)
MONO#: 0.1 10*3/uL (ref 0.1–0.9)
MONO%: 15.5 % — ABNORMAL HIGH (ref 0.0–14.0)
NEUT%: 22.6 % — ABNORMAL LOW (ref 39.0–75.0)
NEUTROS ABS: 0.2 10*3/uL — AB (ref 1.5–6.5)
NRBC: 0 % (ref 0–0)
Platelets: 11 10*3/uL — ABNORMAL LOW (ref 140–400)
RBC: 2.82 10*6/uL — ABNORMAL LOW (ref 4.20–5.82)
RDW: 13.1 % (ref 11.0–14.6)
WBC: 0.8 10*3/uL — AB (ref 4.0–10.3)
lymph#: 0.5 10*3/uL — ABNORMAL LOW (ref 0.9–3.3)

## 2013-10-25 MED ORDER — TBO-FILGRASTIM 480 MCG/0.8ML ~~LOC~~ SOSY
480.0000 ug | PREFILLED_SYRINGE | Freq: Once | SUBCUTANEOUS | Status: AC
  Administered 2013-10-25: 480 ug via SUBCUTANEOUS
  Filled 2013-10-25: qty 0.8

## 2013-10-25 NOTE — Telephone Encounter (Signed)
I instructed pt to come today for granix and tomorrow.

## 2013-10-25 NOTE — Telephone Encounter (Signed)
Diane told pt to come back per chart note.Marland KitchenMarland KitchenI added appts per chart note documented by Shauna Hugh

## 2013-10-25 NOTE — Progress Notes (Signed)
Quick Note:  Call patient with the result and give him 2 more doses of granix ______

## 2013-10-25 NOTE — Telephone Encounter (Signed)
Faxed note to [facility] at [facility] about okay to take amiodarone per Dr Harrington Challenger. I called pt with neutropenic precautions.He voices understanding.

## 2013-10-26 ENCOUNTER — Ambulatory Visit (HOSPITAL_BASED_OUTPATIENT_CLINIC_OR_DEPARTMENT_OTHER): Payer: BC Managed Care – PPO

## 2013-10-26 VITALS — BP 102/62 | HR 96 | Temp 98.8°F

## 2013-10-26 DIAGNOSIS — C3490 Malignant neoplasm of unspecified part of unspecified bronchus or lung: Secondary | ICD-10-CM

## 2013-10-26 DIAGNOSIS — D709 Neutropenia, unspecified: Secondary | ICD-10-CM

## 2013-10-26 MED ORDER — TBO-FILGRASTIM 480 MCG/0.8ML ~~LOC~~ SOSY
480.0000 ug | PREFILLED_SYRINGE | Freq: Once | SUBCUTANEOUS | Status: AC
  Administered 2013-10-26: 480 ug via SUBCUTANEOUS
  Filled 2013-10-26: qty 0.8

## 2013-11-01 ENCOUNTER — Ambulatory Visit: Payer: BC Managed Care – PPO

## 2013-11-01 ENCOUNTER — Other Ambulatory Visit: Payer: Self-pay | Admitting: *Deleted

## 2013-11-01 ENCOUNTER — Telehealth: Payer: Self-pay | Admitting: Dietician

## 2013-11-01 NOTE — Telephone Encounter (Signed)
Brief Outpatient Oncology Nutrition Note  Patient has been identified to be at risk on malnutrition screen.  Wt Readings from Last 10 Encounters:  10/21/13 202 lb 9.6 oz (91.899 kg)  09/09/13 216 lb (97.977 kg)  06/06/13 278 lb (126.1 kg)  01/10/13 280 lb (127.007 kg)  04/22/12 287 lb (130.182 kg)  10/06/11 260 lb (117.935 kg)  06/16/11 241 lb 12.8 oz (109.68 kg)  03/19/11 236 lb (107.049 kg)  03/17/11 244 lb (110.678 kg)  02/27/11 255 lb (115.667 kg)    Dx:  Lung cancer with metas, brain mets.  Called patient secondary to weight loss.  Patient was not available.  Message left with contact information for the [place] [address].  Antonieta Iba, RD, LDN

## 2013-11-02 ENCOUNTER — Encounter: Payer: Self-pay | Admitting: Physician Assistant

## 2013-11-02 ENCOUNTER — Ambulatory Visit (HOSPITAL_BASED_OUTPATIENT_CLINIC_OR_DEPARTMENT_OTHER): Payer: BC Managed Care – PPO

## 2013-11-02 ENCOUNTER — Ambulatory Visit (HOSPITAL_BASED_OUTPATIENT_CLINIC_OR_DEPARTMENT_OTHER): Payer: BC Managed Care – PPO | Admitting: Physician Assistant

## 2013-11-02 ENCOUNTER — Other Ambulatory Visit (HOSPITAL_BASED_OUTPATIENT_CLINIC_OR_DEPARTMENT_OTHER): Payer: BC Managed Care – PPO

## 2013-11-02 VITALS — BP 131/78 | HR 98 | Temp 98.1°F | Resp 18 | Ht 70.0 in | Wt 198.9 lb

## 2013-11-02 DIAGNOSIS — C7970 Secondary malignant neoplasm of unspecified adrenal gland: Secondary | ICD-10-CM

## 2013-11-02 DIAGNOSIS — C7931 Secondary malignant neoplasm of brain: Secondary | ICD-10-CM

## 2013-11-02 DIAGNOSIS — C3490 Malignant neoplasm of unspecified part of unspecified bronchus or lung: Secondary | ICD-10-CM

## 2013-11-02 DIAGNOSIS — C3491 Malignant neoplasm of unspecified part of right bronchus or lung: Secondary | ICD-10-CM

## 2013-11-02 DIAGNOSIS — C7949 Secondary malignant neoplasm of other parts of nervous system: Secondary | ICD-10-CM

## 2013-11-02 DIAGNOSIS — D701 Agranulocytosis secondary to cancer chemotherapy: Secondary | ICD-10-CM

## 2013-11-02 DIAGNOSIS — Z5111 Encounter for antineoplastic chemotherapy: Secondary | ICD-10-CM

## 2013-11-02 DIAGNOSIS — T451X5A Adverse effect of antineoplastic and immunosuppressive drugs, initial encounter: Secondary | ICD-10-CM

## 2013-11-02 DIAGNOSIS — D702 Other drug-induced agranulocytosis: Secondary | ICD-10-CM

## 2013-11-02 LAB — CBC WITH DIFFERENTIAL/PLATELET
BASO%: 0.4 % (ref 0.0–2.0)
Basophils Absolute: 0 10*3/uL (ref 0.0–0.1)
EOS%: 0 % (ref 0.0–7.0)
Eosinophils Absolute: 0 10*3/uL (ref 0.0–0.5)
HEMATOCRIT: 25.7 % — AB (ref 38.4–49.9)
HGB: 8.6 g/dL — ABNORMAL LOW (ref 13.0–17.1)
LYMPH#: 0.3 10*3/uL — AB (ref 0.9–3.3)
LYMPH%: 15.7 % (ref 14.0–49.0)
MCH: 30.1 pg (ref 27.2–33.4)
MCHC: 33.5 g/dL (ref 32.0–36.0)
MCV: 89.9 fL (ref 79.3–98.0)
MONO#: 0.1 10*3/uL (ref 0.1–0.9)
MONO%: 6.7 % (ref 0.0–14.0)
NEUT#: 1.7 10*3/uL (ref 1.5–6.5)
NEUT%: 77.2 % — ABNORMAL HIGH (ref 39.0–75.0)
Platelets: 102 10*3/uL — ABNORMAL LOW (ref 140–400)
RBC: 2.86 10*6/uL — ABNORMAL LOW (ref 4.20–5.82)
RDW: 14.1 % (ref 11.0–14.6)
WBC: 2.2 10*3/uL — AB (ref 4.0–10.3)

## 2013-11-02 LAB — COMPREHENSIVE METABOLIC PANEL (CC13)
ALBUMIN: 3.1 g/dL — AB (ref 3.5–5.0)
ALT: 23 U/L (ref 0–55)
ANION GAP: 9 meq/L (ref 3–11)
AST: 21 U/L (ref 5–34)
Alkaline Phosphatase: 92 U/L (ref 40–150)
BUN: 16 mg/dL (ref 7.0–26.0)
CALCIUM: 9.3 mg/dL (ref 8.4–10.4)
CHLORIDE: 102 meq/L (ref 98–109)
CO2: 21 meq/L — AB (ref 22–29)
Creatinine: 1.5 mg/dL — ABNORMAL HIGH (ref 0.7–1.3)
Glucose: 182 mg/dl — ABNORMAL HIGH (ref 70–140)
Potassium: 4.8 mEq/L (ref 3.5–5.1)
SODIUM: 132 meq/L — AB (ref 136–145)
Total Bilirubin: 0.44 mg/dL (ref 0.20–1.20)
Total Protein: 7.6 g/dL (ref 6.4–8.3)

## 2013-11-02 MED ORDER — SODIUM CHLORIDE 0.9 % IV SOLN
500.0000 mg/m2 | Freq: Once | INTRAVENOUS | Status: AC
  Administered 2013-11-02: 1100 mg via INTRAVENOUS
  Filled 2013-11-02: qty 44

## 2013-11-02 MED ORDER — ONDANSETRON 16 MG/50ML IVPB (CHCC)
16.0000 mg | Freq: Once | INTRAVENOUS | Status: AC
  Administered 2013-11-02: 16 mg via INTRAVENOUS

## 2013-11-02 MED ORDER — DEXAMETHASONE SODIUM PHOSPHATE 20 MG/5ML IJ SOLN
20.0000 mg | Freq: Once | INTRAMUSCULAR | Status: AC
  Administered 2013-11-02: 20 mg via INTRAVENOUS

## 2013-11-02 MED ORDER — SODIUM CHLORIDE 0.9 % IV SOLN
510.0000 mg | Freq: Once | INTRAVENOUS | Status: AC
  Administered 2013-11-02: 510 mg via INTRAVENOUS
  Filled 2013-11-02: qty 51

## 2013-11-02 MED ORDER — DEXAMETHASONE SODIUM PHOSPHATE 20 MG/5ML IJ SOLN
INTRAMUSCULAR | Status: AC
  Filled 2013-11-02: qty 5

## 2013-11-02 MED ORDER — CARBOPLATIN CHEMO INTRADERMAL TEST DOSE 100MCG/0.02ML
100.0000 ug | Freq: Once | INTRADERMAL | Status: AC
  Administered 2013-11-02: 100 ug via INTRADERMAL
  Filled 2013-11-02: qty 0.01

## 2013-11-02 MED ORDER — SODIUM CHLORIDE 0.9 % IV SOLN
Freq: Once | INTRAVENOUS | Status: AC
  Administered 2013-11-02: 16:00:00 via INTRAVENOUS

## 2013-11-02 NOTE — Progress Notes (Addendum)
No images are attached to the encounter. No scans are attached to the encounter. No scans are attached to the encounter. [facility] SHARED VISIT PROGRESS NOTE  Curly Rim, MD [address] [place] [address]  DIAGNOSIS: Lung cancer, primary, with metastasis from lung to other site   Primary site: Lung (Right)   Staging method: AJCC 7th Edition   Clinical: Stage IV (T1b, N2, M1b) signed by Curt Bears, MD on 10/07/2013  9:33 PM   Summary: Stage IV (T1b, N2, M1b)  PRIOR THERAPY:   CURRENT THERAPY: Systemic chemotherapy with carboplatin for an AUC of 5 and Alimta 500 mg per meter squared given every 3 weeks. Status post 1 cycle.  INTERVAL HISTORY: Bryan Rice 53 y.o. male returns for a scheduled regular symptom management visit for followup of his metastatic non small cell lung cancer, adenocarcinoma. He is status post 1 cycle of systemic chemotherapy with  Carboplatin and Alimta. He tolerated the chemotherapy without difficulty with the exception of neutropenia. He did receive Granix 480 mcg for 4 days. He continues to smoke 6 -10 cigarettes per day, down from 3 packs per day. He denied chest pain, shortness of breath, cough, or hemoptysis. He denied any significant weight loss or night sweats. He presents to proceed with cycle #2.   MEDICAL HISTORY: Past Medical History  Diagnosis Date  . Alcohol abuse   . Tobacco abuse   . Atrial fibrillation     s/p TEE-DCCV 11/12;  amiodarone started 02/03/11  . Chronic systolic heart failure   . Arthritis     left elbow  . Gout   . Cardiomyopathy     In the setting of afib with rvr 11/12;  Echo 01/19/11:  Diffuse HK worse in Inf wall and septum, mod LVE, mild LVH, EF 30%, mild BAE  . DM2 (diabetes mellitus, type 2)     Hemoglobin A1c 7.1 in 11/12/pt.states he's not diabetic  . Lung cancer     ALLERGIES:  has No Known Allergies.  MEDICATIONS:  Current Outpatient Prescriptions  Medication Sig  Dispense Refill  . amiodarone (PACERONE) 200 MG tablet Take 200 mg by mouth daily.      Marland Kitchen dexamethasone (DECADRON) 4 MG tablet 4 mg po bid the day before, day of and day after chemotherapy every 3 weeks  40 tablet  1  . folic acid (FOLVITE) 1 MG tablet Take 1 tablet (1 mg total) by mouth daily.  30 tablet  3  . prochlorperazine (COMPAZINE) 10 MG tablet Take 1 tablet (10 mg total) by mouth every 6 (six) hours as needed for nausea or vomiting.  30 tablet  0  . acetaminophen (TYLENOL) 325 MG tablet Take 650 mg by mouth every 6 (six) hours as needed.      . bisacodyl (DULCOLAX) 5 MG EC tablet Take 5 mg by mouth daily as needed for moderate constipation.      Marland Kitchen levofloxacin (LEVAQUIN) 500 MG tablet Take 1 tablet (500 mg total) by mouth daily.  5 tablet  0   No current facility-administered medications for this visit.   Facility-Administered Medications Ordered in Other Visits  Medication Dose Route Frequency Provider Last Rate Last Dose  . 0.9 %  sodium chloride infusion   Intravenous Once Curt Bears, MD      . Dexamethasone Sodium Phosphate (DECADRON) injection 20 mg  20 mg Intravenous Once Curt Bears, MD      . ondansetron [facility]) IVPB 16  mg  16 mg Intravenous Once Curt Bears, MD        SURGICAL HISTORY:  Past Surgical History  Procedure Laterality Date  . Cardioversion  01/20/2011    Procedure: CARDIOVERSION;  Surgeon: Fay Records, MD;  Location: [facility];  Service: Cardiovascular;  Laterality: N/A;  . Tee without cardioversion  01/20/2011    Procedure: TRANSESOPHAGEAL ECHOCARDIOGRAM (TEE);  Surgeon: Fay Records, MD;  Location: [facility] ENDOSCOPY;  Service: Cardiovascular;  Laterality: N/A;  . Cardioversion  01/20/2011    Procedure: CARDIOVERSION;  Surgeon: Fay Records, MD;  Location: [facility];  Service: Cardiovascular;  Laterality: N/A;  . Cardioversion  02/27/2011    Procedure: CARDIOVERSION;  Surgeon: Lelon Perla, MD;  Location[facility];  Service: Cardiovascular;   Laterality: N/A;    REVIEW OF SYSTEMS:  A comprehensive review of systems was negative.   PHYSICAL EXAMINATION: General appearance: alert, cooperative, appears stated age and no distress Head: Normocephalic, without obvious abnormality, atraumatic Neck: no adenopathy, no carotid bruit, no JVD, supple, symmetrical, trachea midline and thyroid not enlarged, symmetric, no tenderness/mass/nodules Lymph nodes: Cervical, supraclavicular, and axillary nodes normal. Resp: clear to auscultation bilaterally Back: symmetric, no curvature. ROM normal. No CVA tenderness. Cardio: regular rate and rhythm, S1, S2 normal, no murmur, click, rub or gallop GI: soft, non-tender; bowel sounds normal; no masses,  no organomegaly Extremities: extremities normal, atraumatic, no cyanosis or edema Neurologic: Alert and oriented X 3, normal strength and tone. Normal symmetric reflexes. Normal coordination and gait  ECOG PERFORMANCE STATUS: 0 - Asymptomatic  Blood pressure 131/78, pulse 98, temperature 98.1 F (36.7 C), temperature source Oral, resp. rate 18, height 5\' 10"  (1.778 m), weight 198 lb 14.4 oz (90.22 kg).  LABORATORY DATA: Lab Results  Component Value Date   WBC 2.2* 11/02/2013   HGB 8.6* 11/02/2013   HCT 25.7* 11/02/2013   MCV 89.9 11/02/2013   PLT 102* 11/02/2013      Chemistry      Component Value Date/Time   NA 132* 11/02/2013 1416   NA 135 06/06/2013 1643   K 4.8 11/02/2013 1416   K 4.3 06/06/2013 1643   CL 100 06/06/2013 1643   CO2 21* 11/02/2013 1416   CO2 26 06/06/2013 1643   BUN 16.0 11/02/2013 1416   BUN 14 06/06/2013 1643   CREATININE 1.5* 11/02/2013 1416   CREATININE 1.1 06/06/2013 1643      Component Value Date/Time   CALCIUM 9.3 11/02/2013 1416   CALCIUM 9.4 06/06/2013 1643   ALKPHOS 92 11/02/2013 1416   ALKPHOS 79 06/06/2013 1643   AST 21 11/02/2013 1416   AST 20 06/06/2013 1643   ALT 23 11/02/2013 1416   ALT 19 06/06/2013 1643   BILITOT 0.44 11/02/2013 1416   BILITOT 0.5 06/06/2013 1643        RADIOGRAPHIC STUDIES:  Ct Chest W Contrast  10/10/2013   CLINICAL DATA:  Metastatic right lung cancer diagnosed in 2014. Osseous metastatic disease diagnosed 1 month ago. Restarting chemotherapy. Completed radiation therapy to the brain. Cough. Weight loss. Constipation.  EXAM: CT CHEST, ABDOMEN, AND PELVIS WITH CONTRAST  TECHNIQUE: Multidetector CT imaging of the chest, abdomen and pelvis was performed following the standard protocol during bolus administration of intravenous contrast.  CONTRAST:  139mL OMNIPAQUE IOHEXOL 300 MG/ML  SOLN  COMPARISON:  09/13/2012  FINDINGS: CT CHEST FINDINGS  Considerable mediastinal adenopathy is observed along with right hilar adenopathy and bilateral infrahilar adenopathy. Index AP window lymph node 2 cm in short  axis dimension. Index right hilar node 1.7 cm in short axis dimension. The right hilar adenopathy causes mild extrinsic narrowing of right upper lobe pulmonary vasculature.  Superior segment right lower lobe nodule, 2.9 x 2.2 x 2.4 cm, with mild adjacent atelectasis.  Numerous old left-sided rib fractures are present, some demonstrating nonunion. Old healed right posterior rib fractures are also present.  There is airspace opacity in a bandlike configuration in the right upper lobe with considerable airway thickening in the right upper lobe and a lesser degree of airway thickening in the right lower lobe.  4 mm left lower lobe nodule, image 42 series 5. Scattered faint nodularity in the right upper lobe.  CT ABDOMEN AND PELVIS FINDINGS  Right adrenal mass, 5.4 x 2.5 cm.  Left adrenal mass, 3.6 x 2.8 cm.  The liver, spleen, and pancreas appear unremarkable. A nodule peripheral to the right posterior perirenal fascia measures 1 cm on image 77 of series 2.  Aortoiliac atherosclerotic vascular disease. Right external iliac node 1.3 cm in short axis, image 106 of series 2. Heterogeneous right inguinal lymph node 1.5 cm, image 124 of series 2. Fatty bilateral  spermatic cords. Urinary bladder unremarkable. Sclerosis in both femoral heads compatible with avascular necrosis. Spurring of both sacroiliac joints.  9 mm of anterolisthesis at L5-S1 with associated bilateral pars defects and severe right and moderate left foraminal stenosis. Dextroconvex upper lumbar scoliosis with left foraminal spurring at the L1-2 level causing foraminal impingement.  The reported prior osseous metastatic disease is not well observed in the chest, abdomen, or bony pelvis -if prior exams can be provided we are happy to issue a comparison.  IMPRESSION: 1. And large superior segment right lower lobe nodule with considerable mediastinal adenopathy, right hilar adenopathy, bilateral infrahilar adenopathy, bilateral adrenal mass is suspicious for metastatic disease, a small left lower lobe pulmonary nodule, and a small lymph node or nodule posterior to the right posterior perirenal fascia. There is also pathologic right external iliac and right inguinal adenopathy. 2. Ancillary findings include atherosclerosis, lumbar scoliosis and spondylosis with pars defects at L5 and foraminal impingement at L1-2 and L5-S1, bilateral hip avascular necrosis, and airspace opacity in a bandlike configuration in the right upper lobe which could be from pneumonia, unusual spread of malignancy, or most likely radiation pneumonitis given the associated airway thickening.   Electronically Signed   By: Sherryl Barters M.D.   On: 10/10/2013 14:08   Ct Abdomen Pelvis W Contrast  10/10/2013   CLINICAL DATA:  Metastatic right lung cancer diagnosed in 2014. Osseous metastatic disease diagnosed 1 month ago. Restarting chemotherapy. Completed radiation therapy to the brain. Cough. Weight loss. Constipation.  EXAM: CT CHEST, ABDOMEN, AND PELVIS WITH CONTRAST  TECHNIQUE: Multidetector CT imaging of the chest, abdomen and pelvis was performed following the standard protocol during bolus administration of intravenous contrast.   CONTRAST:  152mL OMNIPAQUE IOHEXOL 300 MG/ML  SOLN  COMPARISON:  09/13/2012  FINDINGS: CT CHEST FINDINGS  Considerable mediastinal adenopathy is observed along with right hilar adenopathy and bilateral infrahilar adenopathy. Index AP window lymph node 2 cm in short axis dimension. Index right hilar node 1.7 cm in short axis dimension. The right hilar adenopathy causes mild extrinsic narrowing of right upper lobe pulmonary vasculature.  Superior segment right lower lobe nodule, 2.9 x 2.2 x 2.4 cm, with mild adjacent atelectasis.  Numerous old left-sided rib fractures are present, some demonstrating nonunion. Old healed right posterior rib fractures are also present.  There is airspace  opacity in a bandlike configuration in the right upper lobe with considerable airway thickening in the right upper lobe and a lesser degree of airway thickening in the right lower lobe.  4 mm left lower lobe nodule, image 42 series 5. Scattered faint nodularity in the right upper lobe.  CT ABDOMEN AND PELVIS FINDINGS  Right adrenal mass, 5.4 x 2.5 cm.  Left adrenal mass, 3.6 x 2.8 cm.  The liver, spleen, and pancreas appear unremarkable. A nodule peripheral to the right posterior perirenal fascia measures 1 cm on image 77 of series 2.  Aortoiliac atherosclerotic vascular disease. Right external iliac node 1.3 cm in short axis, image 106 of series 2. Heterogeneous right inguinal lymph node 1.5 cm, image 124 of series 2. Fatty bilateral spermatic cords. Urinary bladder unremarkable. Sclerosis in both femoral heads compatible with avascular necrosis. Spurring of both sacroiliac joints.  9 mm of anterolisthesis at L5-S1 with associated bilateral pars defects and severe right and moderate left foraminal stenosis. Dextroconvex upper lumbar scoliosis with left foraminal spurring at the L1-2 level causing foraminal impingement.  The reported prior osseous metastatic disease is not well observed in the chest, abdomen, or bony pelvis -if prior  exams can be provided we are happy to issue a comparison.  IMPRESSION: 1. And large superior segment right lower lobe nodule with considerable mediastinal adenopathy, right hilar adenopathy, bilateral infrahilar adenopathy, bilateral adrenal mass is suspicious for metastatic disease, a small left lower lobe pulmonary nodule, and a small lymph node or nodule posterior to the right posterior perirenal fascia. There is also pathologic right external iliac and right inguinal adenopathy. 2. Ancillary findings include atherosclerosis, lumbar scoliosis and spondylosis with pars defects at L5 and foraminal impingement at L1-2 and L5-S1, bilateral hip avascular necrosis, and airspace opacity in a bandlike configuration in the right upper lobe which could be from pneumonia, unusual spread of malignancy, or most likely radiation pneumonitis given the associated airway thickening.   Electronically Signed   By: Sherryl Barters M.D.   On: 10/10/2013 14:08     ASSESSMENT/PLAN: The patient is a pleasant 53 year old Caucasian male with metastatic non small cell lung cancer, adenocarcinoma. He is status post 1 cycle of chemotherapy with carboplatin and alimta. His counts have recovered with his total white count of 2.2 and an ANC of 1.7. Patient was discussed with an also seen by Dr. Julien Nordmann. We will proceed with cycle #2 of his systemic chemotherapy with carboplatin for an AUC of 5 and Alimta 500 mg per meter squared given every 3 weeks however we will add Neulasta injection date 2 for neutrophil support after today's cycle and with subsequent cycles. He'll continue with weekly labs and return in 3 weeks prior to the start of cycle #3.    All questions were answered. The patient knows to call the clinic with any problems, questions or concerns. We can certainly see the patient much sooner if necessary.  Carlton Adam PA-C   ADDENDUM: Hematology/Oncology Attending: I had a face to face encounter with the patient. I  recommended his care plan. This is a very pleasant 53 years old white male with stage IV non-small cell lung cancer currently undergoing systemic chemotherapy with carboplatin and Alimta status post 1 cycle. He tolerated the first cycle of his treatment fairly well except for neutropenia that require treatment with Granix for 4 days. I recommended for the patient to proceed with cycle #2 today as scheduled but this would be followed by Neulasta  injection on day 2 of every cycle because of the significant neutropenia with the first cycle. He would come back for followup visit in 3 weeks for evaluation before starting cycle #3. The patient was advised to call immediately if he has any concerning symptoms in the interval.  Disclaimer: This note was dictated with voice recognition software. Similar sounding words can inadvertently be transcribed and may be missed upon review. Eilleen Kempf., MD 11/02/2013

## 2013-11-02 NOTE — Patient Instructions (Signed)
[facility] Discharge Instructions for Patients Receiving Chemotherapy  Today you received the following chemotherapy agents alimta and carboplatin  To help prevent nausea and vomiting after your treatment, we encourage you to take your nausea medication compazine 10 mg every 6 hours as needed for nausea If you develop nausea and vomiting that is not controlled by your nausea medication, call the clinic.   BELOW ARE SYMPTOMS THAT SHOULD BE REPORTED IMMEDIATELY:  *FEVER GREATER THAN 100.5 F  *CHILLS WITH OR WITHOUT FEVER  NAUSEA AND VOMITING THAT IS NOT CONTROLLED WITH YOUR NAUSEA MEDICATION  *UNUSUAL SHORTNESS OF BREATH  *UNUSUAL BRUISING OR BLEEDING  TENDERNESS IN MOUTH AND THROAT WITH OR WITHOUT PRESENCE OF ULCERS  *URINARY PROBLEMS  *BOWEL PROBLEMS  UNUSUAL RASH Items with * indicate a potential emergency and should be followed up as soon as possible.  Feel free to call the clinic you have any questions or concerns. The clinic phone number is [phone].

## 2013-11-02 NOTE — Patient Instructions (Addendum)
Return tomorrow for your injection to help support her white blood cell count Continue weekly labs as scheduled Followup in 3 weeks prior to the start of your next cycle of chemotherapy

## 2013-11-03 ENCOUNTER — Ambulatory Visit (HOSPITAL_BASED_OUTPATIENT_CLINIC_OR_DEPARTMENT_OTHER): Payer: BC Managed Care – PPO

## 2013-11-03 ENCOUNTER — Telehealth: Payer: Self-pay | Admitting: Internal Medicine

## 2013-11-03 ENCOUNTER — Other Ambulatory Visit: Payer: BC Managed Care – PPO

## 2013-11-03 VITALS — BP 106/66 | HR 81 | Temp 98.2°F

## 2013-11-03 DIAGNOSIS — C3490 Malignant neoplasm of unspecified part of unspecified bronchus or lung: Secondary | ICD-10-CM

## 2013-11-03 DIAGNOSIS — D702 Other drug-induced agranulocytosis: Secondary | ICD-10-CM

## 2013-11-03 DIAGNOSIS — C3491 Malignant neoplasm of unspecified part of right bronchus or lung: Secondary | ICD-10-CM

## 2013-11-03 MED ORDER — PEGFILGRASTIM INJECTION 6 MG/0.6ML
6.0000 mg | Freq: Once | SUBCUTANEOUS | Status: AC
  Administered 2013-11-03: 6 mg via SUBCUTANEOUS
  Filled 2013-11-03: qty 0.6

## 2013-11-03 NOTE — Telephone Encounter (Signed)
s.w. pt and he will pick up new sched today.Marland KitchenMarland KitchenMarland KitchenMarland Kitchenpt wants to stay on tues...sched was off due to the holiday

## 2013-11-07 ENCOUNTER — Ambulatory Visit: Payer: BC Managed Care – PPO

## 2013-11-07 ENCOUNTER — Ambulatory Visit: Payer: BC Managed Care – PPO | Admitting: Radiation Oncology

## 2013-11-08 ENCOUNTER — Telehealth: Payer: Self-pay | Admitting: *Deleted

## 2013-11-08 ENCOUNTER — Other Ambulatory Visit: Payer: BC Managed Care – PPO

## 2013-11-08 ENCOUNTER — Other Ambulatory Visit (HOSPITAL_BASED_OUTPATIENT_CLINIC_OR_DEPARTMENT_OTHER): Payer: BC Managed Care – PPO

## 2013-11-08 ENCOUNTER — Other Ambulatory Visit: Payer: Self-pay | Admitting: *Deleted

## 2013-11-08 DIAGNOSIS — D702 Other drug-induced agranulocytosis: Secondary | ICD-10-CM

## 2013-11-08 DIAGNOSIS — C3490 Malignant neoplasm of unspecified part of unspecified bronchus or lung: Secondary | ICD-10-CM

## 2013-11-08 DIAGNOSIS — C7949 Secondary malignant neoplasm of other parts of nervous system: Secondary | ICD-10-CM

## 2013-11-08 DIAGNOSIS — C7970 Secondary malignant neoplasm of unspecified adrenal gland: Secondary | ICD-10-CM

## 2013-11-08 DIAGNOSIS — C7931 Secondary malignant neoplasm of brain: Secondary | ICD-10-CM

## 2013-11-08 LAB — COMPREHENSIVE METABOLIC PANEL (CC13)
ALBUMIN: 3 g/dL — AB (ref 3.5–5.0)
ALK PHOS: 91 U/L (ref 40–150)
ALT: 20 U/L (ref 0–55)
AST: 18 U/L (ref 5–34)
Anion Gap: 7 mEq/L (ref 3–11)
BILIRUBIN TOTAL: 1.11 mg/dL (ref 0.20–1.20)
BUN: 23.8 mg/dL (ref 7.0–26.0)
CO2: 23 mEq/L (ref 22–29)
Calcium: 8.8 mg/dL (ref 8.4–10.4)
Chloride: 103 mEq/L (ref 98–109)
Creatinine: 1.2 mg/dL (ref 0.7–1.3)
Glucose: 105 mg/dl (ref 70–140)
POTASSIUM: 3.9 meq/L (ref 3.5–5.1)
SODIUM: 133 meq/L — AB (ref 136–145)
TOTAL PROTEIN: 6.6 g/dL (ref 6.4–8.3)

## 2013-11-08 LAB — CBC WITH DIFFERENTIAL/PLATELET
BASO%: 0 % (ref 0.0–2.0)
Basophils Absolute: 0 10*3/uL (ref 0.0–0.1)
EOS%: 3.1 % (ref 0.0–7.0)
Eosinophils Absolute: 0 10*3/uL (ref 0.0–0.5)
HCT: 23.3 % — ABNORMAL LOW (ref 38.4–49.9)
HEMOGLOBIN: 7.8 g/dL — AB (ref 13.0–17.1)
LYMPH#: 0.5 10*3/uL — AB (ref 0.9–3.3)
LYMPH%: 70.3 % — ABNORMAL HIGH (ref 14.0–49.0)
MCH: 29.9 pg (ref 27.2–33.4)
MCHC: 33.5 g/dL (ref 32.0–36.0)
MCV: 89.3 fL (ref 79.3–98.0)
MONO#: 0 10*3/uL — ABNORMAL LOW (ref 0.1–0.9)
MONO%: 4.7 % (ref 0.0–14.0)
NEUT#: 0.1 10*3/uL — CL (ref 1.5–6.5)
NEUT%: 21.9 % — ABNORMAL LOW (ref 39.0–75.0)
Platelets: 112 10*3/uL — ABNORMAL LOW (ref 140–400)
RBC: 2.61 10*6/uL — AB (ref 4.20–5.82)
RDW: 13.7 % (ref 11.0–14.6)
WBC: 0.6 10*3/uL — CL (ref 4.0–10.3)
nRBC: 0 % (ref 0–0)

## 2013-11-08 MED ORDER — CEPHALEXIN 500 MG PO CAPS: 500.0000 mg | ORAL_CAPSULE | Freq: Three times a day (TID) | ORAL | Status: DC

## 2013-11-08 MED ORDER — CIPROFLOXACIN HCL 500 MG PO TABS: 500.0000 mg | ORAL_TABLET | Freq: Two times a day (BID) | ORAL | Status: DC

## 2013-11-08 NOTE — Telephone Encounter (Signed)
Hbg 7.8.  WBC 0.6, ANC 0.1.  Per Dr Vista Mink, pt needs rx for Cipro 500mg  BID.  Informed pt about low hbg.  Advised about a blood transfusion.  Pt states he feels good right now and wants to wait to see what his labs are on 9/22 before getting a transfusion.  Asked him to call if he does start to become symptomatic and we will arrange a blood transfusion this week.  Told him we would to a type and hold on 9/22.  Also gave him neutropenic pxns.    Rx for cipro faxed to [facility] fax [phone].

## 2013-11-08 NOTE — Telephone Encounter (Signed)
Rx for cipro cancelled due to incompatibility with amioderone.  Per Dr Vista Mink, okay to give rx for keflex 500mg  TID.  Faxed over new rx to [facility] and asked for cancellation of cipro rx that was faxed.  Rx faxed to [phone].

## 2013-11-11 ENCOUNTER — Other Ambulatory Visit: Payer: BC Managed Care – PPO

## 2013-11-14 ENCOUNTER — Ambulatory Visit: Payer: BC Managed Care – PPO | Admitting: Radiation Oncology

## 2013-11-14 ENCOUNTER — Telehealth: Payer: Self-pay | Admitting: Dietician

## 2013-11-14 NOTE — Telephone Encounter (Signed)
Brief Outpatient Oncology Nutrition Note  Patient has been identified to be at risk on malnutrition screen.  Wt Readings from Last 10 Encounters:  11/02/13 198 lb 14.4 oz (90.22 kg)  10/21/13 202 lb 9.6 oz (91.899 kg)  09/09/13 216 lb (97.977 kg)  06/06/13 278 lb (126.1 kg)  01/10/13 280 lb (127.007 kg)  04/22/12 287 lb (130.182 kg)  10/06/11 260 lb (117.935 kg)  06/16/11 241 lb 12.8 oz (109.68 kg)  03/19/11 236 lb (107.049 kg)  03/17/11 244 lb (110.678 kg)      Dx:  Lung cancer with mets to brain.  Lives in a ALF  Called patient due to weight loss.  Patient was 216 lbs 3 months ago and has lost 18 lbs (8%) during this time.  Patient states that he has little appetite but goes to each meal.  He does not finish it.  States that he likes Ensure Pudding but does not have it now.  Has high calorie snacks but does not eat them often due to decreased appetite.  Discussed need for increased snacks which patient has available as well as increasing calories of food provided at ALF.  Patient feels that 215 lbs is a healthier weight.  [facility] RD available as needed.  Antonieta Iba, RD, LDN

## 2013-11-15 ENCOUNTER — Other Ambulatory Visit (HOSPITAL_BASED_OUTPATIENT_CLINIC_OR_DEPARTMENT_OTHER): Payer: BC Managed Care – PPO

## 2013-11-15 ENCOUNTER — Ambulatory Visit
Admission: RE | Admit: 2013-11-15 | Discharge: 2013-11-15 | Disposition: A | Discharge status: Home / Self Care | Payer: BC Managed Care – PPO | Source: Ambulatory Visit | Attending: Radiation Oncology | Admitting: Radiation Oncology

## 2013-11-15 ENCOUNTER — Telehealth: Payer: Self-pay | Admitting: *Deleted

## 2013-11-15 ENCOUNTER — Telehealth: Payer: Self-pay | Admitting: Nurse Practitioner

## 2013-11-15 DIAGNOSIS — C7949 Secondary malignant neoplasm of other parts of nervous system: Principal | ICD-10-CM

## 2013-11-15 DIAGNOSIS — C3490 Malignant neoplasm of unspecified part of unspecified bronchus or lung: Secondary | ICD-10-CM

## 2013-11-15 DIAGNOSIS — C7931 Secondary malignant neoplasm of brain: Secondary | ICD-10-CM

## 2013-11-15 DIAGNOSIS — C3491 Malignant neoplasm of unspecified part of right bronchus or lung: Secondary | ICD-10-CM

## 2013-11-15 LAB — COMPREHENSIVE METABOLIC PANEL (CC13)
ALT: 14 U/L (ref 0–55)
AST: 20 U/L (ref 5–34)
Albumin: 2.7 g/dL — ABNORMAL LOW (ref 3.5–5.0)
Alkaline Phosphatase: 103 U/L (ref 40–150)
Anion Gap: 10 mEq/L (ref 3–11)
BILIRUBIN TOTAL: 0.42 mg/dL (ref 0.20–1.20)
BUN: 12.8 mg/dL (ref 7.0–26.0)
CO2: 23 mEq/L (ref 22–29)
Calcium: 9.1 mg/dL (ref 8.4–10.4)
Chloride: 101 mEq/L (ref 98–109)
Creatinine: 1.3 mg/dL (ref 0.7–1.3)
Glucose: 106 mg/dl (ref 70–140)
Potassium: 3.2 mEq/L — ABNORMAL LOW (ref 3.5–5.1)
Sodium: 133 mEq/L — ABNORMAL LOW (ref 136–145)
Total Protein: 7 g/dL (ref 6.4–8.3)

## 2013-11-15 LAB — CBC WITH DIFFERENTIAL/PLATELET
BASO%: 0.1 % (ref 0.0–2.0)
Basophils Absolute: 0 10*3/uL (ref 0.0–0.1)
EOS%: 0.3 % (ref 0.0–7.0)
Eosinophils Absolute: 0 10*3/uL (ref 0.0–0.5)
HEMATOCRIT: 20 % — AB (ref 38.4–49.9)
HGB: 6.7 g/dL — CL (ref 13.0–17.1)
LYMPH%: 39.6 % (ref 14.0–49.0)
MCH: 30 pg (ref 27.2–33.4)
MCHC: 33.6 g/dL (ref 32.0–36.0)
MCV: 89.2 fL (ref 79.3–98.0)
MONO#: 0.2 10*3/uL (ref 0.1–0.9)
MONO%: 9.4 % (ref 0.0–14.0)
NEUT#: 1 10*3/uL — ABNORMAL LOW (ref 1.5–6.5)
NEUT%: 50.6 % (ref 39.0–75.0)
PLATELETS: 11 10*3/uL — AB (ref 140–400)
RBC: 2.24 10*6/uL — ABNORMAL LOW (ref 4.20–5.82)
RDW: 13.4 % (ref 11.0–14.6)
WBC: 2.1 10*3/uL — ABNORMAL LOW (ref 4.0–10.3)
lymph#: 0.8 10*3/uL — ABNORMAL LOW (ref 0.9–3.3)

## 2013-11-15 MED ORDER — GADOBENATE DIMEGLUMINE 529 MG/ML IV SOLN
19.0000 mL | Freq: Once | INTRAVENOUS | Status: AC | PRN
  Administered 2013-11-15: 19 mL via INTRAVENOUS

## 2013-11-15 NOTE — Telephone Encounter (Signed)
Left msg with patient to call us back.

## 2013-11-15 NOTE — Telephone Encounter (Signed)
SPoke with patient, he is going to come in for type and crossmatch 11/16/13 and we will arrange for 2 units of blood.  Will fax over rx for kdur as well per Dr Vista Mink for hypokalemia

## 2013-11-15 NOTE — Progress Notes (Signed)
Quick Note:  Call patient with the result and arrange for 2 units of PRBCs. ______

## 2013-11-15 NOTE — Telephone Encounter (Signed)
Pt called back, left msg, attempted to call patient back, no answer, left another msg to call back.

## 2013-11-15 NOTE — Telephone Encounter (Signed)
Message copied by Josephmichael Lisenbee L on Tue Nov 15, 2013 11:06 AM ------      Message from: Curt Bears      Created: Tue Nov 15, 2013 10:17 AM       Call patient with the result and arrange for 2 units of PRBCs. ------

## 2013-11-15 NOTE — Progress Notes (Signed)
Quick Note:  Call patient with the result and order K Dur 20 meq po qd X 7 ______

## 2013-11-15 NOTE — Telephone Encounter (Signed)
C/D 11/15/13 for appt. 11/22/13

## 2013-11-16 ENCOUNTER — Ambulatory Visit
Admission: RE | Admit: 2013-11-16 | Discharge: 2013-11-16 | Disposition: A | Discharge status: Home / Self Care | Payer: BC Managed Care – PPO | Source: Ambulatory Visit

## 2013-11-16 ENCOUNTER — Telehealth: Payer: Self-pay | Admitting: *Deleted

## 2013-11-16 ENCOUNTER — Encounter: Payer: Self-pay | Admitting: Radiation Oncology

## 2013-11-16 ENCOUNTER — Other Ambulatory Visit: Payer: BC Managed Care – PPO

## 2013-11-16 ENCOUNTER — Other Ambulatory Visit: Payer: Self-pay | Admitting: *Deleted

## 2013-11-16 ENCOUNTER — Ambulatory Visit (HOSPITAL_COMMUNITY)
Admission: RE | Admit: 2013-11-16 | Discharge: 2013-11-16 | Disposition: A | Discharge status: Home / Self Care | Payer: BC Managed Care – PPO | Source: Ambulatory Visit | Attending: Internal Medicine | Admitting: Internal Medicine

## 2013-11-16 ENCOUNTER — Ambulatory Visit
Admission: RE | Admit: 2013-11-16 | Discharge: 2013-11-16 | Disposition: A | Discharge status: Home / Self Care | Payer: BC Managed Care – PPO | Source: Ambulatory Visit | Attending: Radiation Oncology | Admitting: Radiation Oncology

## 2013-11-16 VITALS — BP 106/66 | HR 91 | Temp 98.0°F | Resp 16 | Ht 70.0 in | Wt 198.0 lb

## 2013-11-16 DIAGNOSIS — C3491 Malignant neoplasm of unspecified part of right bronchus or lung: Secondary | ICD-10-CM

## 2013-11-16 DIAGNOSIS — E876 Hypokalemia: Secondary | ICD-10-CM

## 2013-11-16 DIAGNOSIS — D630 Anemia in neoplastic disease: Secondary | ICD-10-CM

## 2013-11-16 DIAGNOSIS — C7931 Secondary malignant neoplasm of brain: Secondary | ICD-10-CM

## 2013-11-16 DIAGNOSIS — Z510 Encounter for antineoplastic radiation therapy: Secondary | ICD-10-CM | POA: Diagnosis not present

## 2013-11-16 LAB — ABO/RH: ABO/RH(D): B POS

## 2013-11-16 LAB — PREPARE RBC (CROSSMATCH)

## 2013-11-16 MED ORDER — POTASSIUM CHLORIDE CRYS ER 20 MEQ PO TBCR: 20.0000 meq | EXTENDED_RELEASE_TABLET | ORAL | Status: DC

## 2013-11-16 NOTE — Progress Notes (Signed)
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 To review his history briefly, patient presented with locally advanced lung cancer and adrenal metastasis in July of 2014. CT-guided biopsy of the left adrenal gland on 10/06/2012 for set demonstrated adenocarcinoma.   Staging brain MRI on 10/20/2012 revealed numerous subcentimeter brain metastases. Brain metastases were small and asymptomatic, patient proceeded to systemic chemotherapy. He received 6 cycles of carboplatin and Alimta. Later, he proceeded with whole brain radiation treatment completed this on 05/04/2013. The patient relocated to [facility] and was seen by Dr. Julien Nordmann on 10/06/2013.    He has been receiving carboplatin and Alimta since then. Brain MRI was repeated recently using the high-resolution SRS protocol with 1 mm axial slice. The patient presents today to review the study. He is essentially asymptomatic other than some leg weakness.  ALLERGIES:  has No Known Allergies.  Meds: Current Outpatient Prescriptions  Medication Sig Dispense Refill  . amiodarone (PACERONE) 200 MG tablet Take 200 mg by mouth daily.      . cephALEXin (KEFLEX) 500 MG capsule Take 1 capsule (500 mg total) by mouth 3 (three) times daily. 500mg  TID x 5 days  15 capsule  0  . dexamethasone (DECADRON) 4 MG tablet 4 mg po bid the day before, day of and day after chemotherapy every 3 weeks  40 tablet  1  . folic acid (FOLVITE) 1 MG tablet Take 1 tablet (1 mg total) by mouth daily.  30 tablet  3  . potassium chloride SA (K-DUR,KLOR-CON) 20 MEQ tablet  Take 1 tablet (20 mEq total) by mouth as directed. Daily x 7 days  7 tablet  0  . acetaminophen (TYLENOL) 325 MG tablet Take 650 mg by mouth every 6 (six) hours as needed.      . bisacodyl (DULCOLAX) 5 MG EC tablet Take 5 mg by mouth daily as needed for moderate constipation.      . prochlorperazine (COMPAZINE) 10 MG tablet Take 1 tablet (10 mg total) by mouth every 6 (six) hours as needed for nausea or vomiting.  30 tablet  0   No current facility-administered medications for this encounter.    Physical Findings: The patient is in no acute distress. Patient is alert and oriented.  height is 5\' 10"  (1.778 m) and weight is 198 lb (89.812 kg). His oral temperature is 98 F (36.7 C). His blood pressure is 106/66 and his pulse is 91. His respiration is 16 and oxygen saturation is 100%. . The patient's speech is fluent articulate. He is alert and oriented. No significant changes.  Lab Findings: Lab Results  Component Value Date   WBC 2.1* 11/15/2013   HGB 6.7* 11/15/2013   HCT 20.0* 11/15/2013   MCV 89.2 11/15/2013   PLT 11* 11/15/2013    @LASTCHEM @  Radiographic Findings: Mr [name] Contrast  11/16/2013   CLINICAL DATA:  53 year old male with metastatic lung cancer, first diagnosed in 2014. Status post whole brain radiation completed in February 2015. Recurrent brain metastases. Stereotactic radiosurgery targeting requested. Subsequent encounter.  EXAM: MRI HEAD WITHOUT AND WITH  CONTRAST  TECHNIQUE: Multiplanar, multiecho pulse sequences of the brain and surrounding structures were obtained without and with intravenous contrast.  CONTRAST:  9mL MULTIHANCE GADOBENATE DIMEGLUMINE 529 MG/ML IV SOLN  COMPARISON:  Outside ([facility]) Brain MRI 08/27/2013 and earlier.  FINDINGS: 3 Tesla imaging performed today, versus 1.5 Tesla on the 08/27/2013 comparison.  Numerous small enhancing brain metastases; at least 37 distinct metastases. The lesions range from punctate to 9-10 mm in largest dimension  (several of the largest lesions are in the cerebellum). 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 Most of the largest of these have not significantly changed in size since July, but are much better delineated with the current technique.  Despite the large number of lesions, minimal cerebral edema is present. No intracranial mass effect. Most of the lesions do not demonstrate restricted diffusion. A small number (07-2008) show hemosiderin on T2 * weighted imaging.  No restricted diffusion or evidence of acute infarction. Major intracranial vascular flow voids are stable. No ventriculomegaly. No acute intracranial hemorrhage identified. Negative pituitary. Grossly negative visualized cervical spinal cord visible bone marrow signal within normal limits.  Chronic mastoid effusions. Minor paranasal sinus mucosal thickening. Chronic benign T2 hyperintense focus in the anterior hard palate, such as related to benign dental cyst. Visualized scalp soft tissues are within normal limits.  IMPRESSION: 1. 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 2. Minimal cerebral edema and no intracranial mass effect.   Electronically Signed   By: Lars Pinks M.D.   On: 11/16/2013 14:52    Impression:  The patient has essentially stable numerous brain metastases with no evidence of tumor progression, edema, or progressive neurologic symptoms.  Plan:  I would recommend brain MRI in 3 months and then followup, reserving salvage radiation options for documented progressive disease. If the patient develops progression or new metastases in the future, he may be eligible for stereotactic radiosurgery for limited disease or repeat whole brain radiation in the event of more disseminated  progression.  _____________________________________  Sheral Apley Tammi Klippel, M.D.

## 2013-11-16 NOTE — Progress Notes (Signed)
Location/Histology of Brain Tumor: stage IV adenocarcinoma of the lung   Patient presented with symptoms of:  Weakness in right leg related to bleeding brain mets; since he has stopped Warfarin weakness is slowly getting better  Past or anticipated interventions, if any, per neurosurgery: no  Past or anticipated interventions, if any, per medical oncology: yes, systemic chemotherapy with carboplatin for an AUC of 5 and ALimta 500 mg per meter squared given every three weeks. Status post 2 cycles.  Dose of Decadron, if applicable: yes, before and after chemotherapy  Recent neurologic symptoms, if any:   Seizures: NO  Headaches: Occasional brief sharp pain that resolve on their own  Nausea: NO  Dizziness/ataxia: only after he smokes a cigarette  Difficulty with hand coordination: great  Focal numbness/weakness: numbness in both feet  Visual deficits/changes: chronic floaters no better or worse  Confusion/Memory deficits: NO  Painful bone metastases at present, if any: Denies pain   SAFETY ISSUES:  Prior radiation? Yes; whole brain at [facility]  Pacemaker/ICD? NO  Possible current pregnancy? no  Is the patient on methotrexate? no  Additional Complaints / other details: 53 year old male. Resident of SNF - [facility]. MRI done 9/22. Type and crossed today for two units of PRBC tomorrow. Reports diminished appetite is slowly improving. Healthy weight 215 lb. Sitting in wheelchair for consultation today. Reports he is afraid he might fall using his walker therefore, he uses a wheelchair mostly.

## 2013-11-16 NOTE — Telephone Encounter (Signed)
Discs from [place] of pt's scans sent via the carrier service to [facility] for comparison of the MRI that was completed 11/15/13 with the MRI that was done by [facility].  Envelope left up front with Wilma for pick up addressed to Nonie Hoyer at [employer] [address].  They will mail the discs back to Dr Julien Nordmann

## 2013-11-16 NOTE — Telephone Encounter (Signed)
Order faxed to [facility] for potassium.

## 2013-11-16 NOTE — Telephone Encounter (Signed)
Per staff message from desk RN I have scheduled appt for Friday

## 2013-11-16 NOTE — Progress Notes (Signed)
See progress note under physician encounter. 

## 2013-11-16 NOTE — Telephone Encounter (Signed)
Message copied by Zorian Gunderman, [place] on Wed Nov 16, 2013  8:51 AM ------      Message from: Curt Bears      Created: Tue Nov 15, 2013 10:51 AM       Call patient with the result and order K Dur 20 meq po qd X 7 ------

## 2013-11-17 ENCOUNTER — Other Ambulatory Visit: Payer: Self-pay | Admitting: Medical Oncology

## 2013-11-17 ENCOUNTER — Ambulatory Visit (HOSPITAL_BASED_OUTPATIENT_CLINIC_OR_DEPARTMENT_OTHER): Payer: BC Managed Care – PPO

## 2013-11-17 ENCOUNTER — Encounter: Payer: Self-pay | Admitting: *Deleted

## 2013-11-17 VITALS — BP 122/72 | HR 62 | Temp 97.7°F | Resp 19

## 2013-11-17 DIAGNOSIS — D630 Anemia in neoplastic disease: Secondary | ICD-10-CM

## 2013-11-17 MED ORDER — DIPHENHYDRAMINE HCL 25 MG PO CAPS
25.0000 mg | ORAL_CAPSULE | Freq: Once | ORAL | Status: AC
  Administered 2013-11-17: 25 mg via ORAL

## 2013-11-17 MED ORDER — ACETAMINOPHEN 325 MG PO TABS
650.0000 mg | ORAL_TABLET | Freq: Once | ORAL | Status: AC
  Administered 2013-11-17: 650 mg via ORAL

## 2013-11-17 MED ORDER — SODIUM CHLORIDE 0.9 % IV SOLN
250.0000 mL | Freq: Once | INTRAVENOUS | Status: AC
  Administered 2013-11-17: 250 mL via INTRAVENOUS

## 2013-11-17 MED ORDER — DIPHENHYDRAMINE HCL 25 MG PO CAPS
ORAL_CAPSULE | ORAL | Status: AC
  Filled 2013-11-17: qty 1

## 2013-11-17 MED ORDER — ACETAMINOPHEN 325 MG PO TABS
ORAL_TABLET | ORAL | Status: AC
  Filled 2013-11-17: qty 2

## 2013-11-17 NOTE — Patient Instructions (Signed)

## 2013-11-17 NOTE — Telephone Encounter (Signed)
I left a message for pt to pick up and start kdur

## 2013-11-17 NOTE — Addendum Note (Signed)
Encounter addended by: Heywood Footman, RN on: 11/17/2013 10:43 AM<BR>     Documentation filed: Charges VN

## 2013-11-17 NOTE — Telephone Encounter (Signed)
Message copied by Ardeen Garland on Thu Nov 17, 2013  8:27 AM ------      Message from: Curt Bears      Created: Tue Nov 15, 2013 10:51 AM       Call patient with the result and order K Dur 20 meq po qd X 7 ------

## 2013-11-18 LAB — TYPE AND SCREEN
ABO/RH(D): B POS
Antibody Screen: NEGATIVE
UNIT DIVISION: 0
Unit division: 0

## 2013-11-22 ENCOUNTER — Encounter: Payer: Self-pay | Admitting: Nurse Practitioner

## 2013-11-22 ENCOUNTER — Ambulatory Visit: Payer: BC Managed Care – PPO

## 2013-11-22 ENCOUNTER — Other Ambulatory Visit: Payer: Self-pay | Admitting: Radiation Therapy

## 2013-11-22 ENCOUNTER — Ambulatory Visit: Payer: BC Managed Care – PPO | Admitting: Internal Medicine

## 2013-11-22 ENCOUNTER — Other Ambulatory Visit (HOSPITAL_BASED_OUTPATIENT_CLINIC_OR_DEPARTMENT_OTHER): Payer: BC Managed Care – PPO

## 2013-11-22 ENCOUNTER — Other Ambulatory Visit: Payer: BC Managed Care – PPO

## 2013-11-22 ENCOUNTER — Telehealth: Payer: Self-pay | Admitting: *Deleted

## 2013-11-22 ENCOUNTER — Ambulatory Visit (HOSPITAL_BASED_OUTPATIENT_CLINIC_OR_DEPARTMENT_OTHER): Payer: BC Managed Care – PPO | Admitting: Nurse Practitioner

## 2013-11-22 VITALS — BP 111/67 | HR 90 | Temp 98.0°F | Resp 18 | Ht 70.0 in | Wt 190.7 lb

## 2013-11-22 DIAGNOSIS — F17200 Nicotine dependence, unspecified, uncomplicated: Secondary | ICD-10-CM

## 2013-11-22 DIAGNOSIS — C3490 Malignant neoplasm of unspecified part of unspecified bronchus or lung: Secondary | ICD-10-CM

## 2013-11-22 DIAGNOSIS — C7931 Secondary malignant neoplasm of brain: Secondary | ICD-10-CM

## 2013-11-22 DIAGNOSIS — C7970 Secondary malignant neoplasm of unspecified adrenal gland: Secondary | ICD-10-CM

## 2013-11-22 DIAGNOSIS — C3491 Malignant neoplasm of unspecified part of right bronchus or lung: Secondary | ICD-10-CM

## 2013-11-22 DIAGNOSIS — R5381 Other malaise: Secondary | ICD-10-CM

## 2013-11-22 DIAGNOSIS — C7949 Secondary malignant neoplasm of other parts of nervous system: Secondary | ICD-10-CM

## 2013-11-22 DIAGNOSIS — R21 Rash and other nonspecific skin eruption: Secondary | ICD-10-CM

## 2013-11-22 DIAGNOSIS — D61818 Other pancytopenia: Secondary | ICD-10-CM | POA: Insufficient documentation

## 2013-11-22 DIAGNOSIS — E8809 Other disorders of plasma-protein metabolism, not elsewhere classified: Secondary | ICD-10-CM

## 2013-11-22 LAB — CBC WITH DIFFERENTIAL/PLATELET
BASO%: 0.2 % (ref 0.0–2.0)
Basophils Absolute: 0 10*3/uL (ref 0.0–0.1)
EOS%: 0.1 % (ref 0.0–7.0)
Eosinophils Absolute: 0 10*3/uL (ref 0.0–0.5)
HCT: 29.2 % — ABNORMAL LOW (ref 38.4–49.9)
HGB: 9.7 g/dL — ABNORMAL LOW (ref 13.0–17.1)
LYMPH%: 21.8 % (ref 14.0–49.0)
MCH: 29.5 pg (ref 27.2–33.4)
MCHC: 33.2 g/dL (ref 32.0–36.0)
MCV: 88.7 fL (ref 79.3–98.0)
MONO#: 1 10*3/uL — ABNORMAL HIGH (ref 0.1–0.9)
MONO%: 20.4 % — ABNORMAL HIGH (ref 0.0–14.0)
NEUT#: 2.9 10*3/uL (ref 1.5–6.5)
NEUT%: 57.5 % (ref 39.0–75.0)
PLATELETS: 57 10*3/uL — AB (ref 140–400)
RBC: 3.29 10*6/uL — ABNORMAL LOW (ref 4.20–5.82)
RDW: 13.5 % (ref 11.0–14.6)
WBC: 5.1 10*3/uL (ref 4.0–10.3)
lymph#: 1.1 10*3/uL (ref 0.9–3.3)

## 2013-11-22 LAB — COMPREHENSIVE METABOLIC PANEL (CC13)
ALK PHOS: 115 U/L (ref 40–150)
ALT: 20 U/L (ref 0–55)
AST: 25 U/L (ref 5–34)
Albumin: 2.8 g/dL — ABNORMAL LOW (ref 3.5–5.0)
Anion Gap: 6 mEq/L (ref 3–11)
BUN: 10.3 mg/dL (ref 7.0–26.0)
CO2: 25 mEq/L (ref 22–29)
CREATININE: 1.2 mg/dL (ref 0.7–1.3)
Calcium: 9.6 mg/dL (ref 8.4–10.4)
Chloride: 105 mEq/L (ref 98–109)
GLUCOSE: 90 mg/dL (ref 70–140)
Potassium: 4.1 mEq/L (ref 3.5–5.1)
Sodium: 136 mEq/L (ref 136–145)
Total Bilirubin: 0.36 mg/dL (ref 0.20–1.20)
Total Protein: 7.1 g/dL (ref 6.4–8.3)

## 2013-11-22 MED ORDER — DEXAMETHASONE 4 MG PO TABS: ORAL_TABLET | ORAL | Status: DC

## 2013-11-22 NOTE — Assessment & Plan Note (Signed)
Patient continues to smoke; but states he has decreased from 3 packs per day down to less than half a pack per day.  Patient was once again advised to stop smoking altogether if at all possible

## 2013-11-22 NOTE — Assessment & Plan Note (Signed)
Hemoglobin has stabilized at 9.7.  However, the platelets continue low at 57.  Patient denies any worsening issues with either easy bleeding or bruising.  Will hold chemotherapy today 22 thrombocytopenia.

## 2013-11-22 NOTE — Assessment & Plan Note (Signed)
Patient reports of the rash/lesions to his bilateral lower extremities was initially noted in August 2015.  He states that he does have a history of some chronic ringworm to his left forearm.  This may very well be a fungal or ringworm type of rash.  Will order a dermatology referral today.

## 2013-11-22 NOTE — Assessment & Plan Note (Signed)
Review of patient's labs drawn today reveal that his hemoglobin has improved from 6.72 9.7.  However, his platelets remain low at 57.  Will therefore hold cycle 3 of his carboplatin/Alimta chemotherapy regimen that was scheduled today.  Patient will return next week on Tuesday, 11/29/2013 for labs, cycle 3 of his chemotherapy, and in office visit.  He will receive a Neulasta injection on day 2 of his chemotherapy regimen.  He will also continue with weekly labs.  Also, patient states that he ran out of his dexamethasone.  A refill of the dexamethasone was given to the patient today.  Patient is also scheduled for a restaging brain MRI on 02/03/2014; and radiation oncology followup appointment on 02/06/2014.

## 2013-11-22 NOTE — Progress Notes (Signed)
[facility]   Chief Complaint  Patient presents with  . Follow-up    HPI: Bryan Rice 53 y.o. male diagnosed with lung cancer.  Currently undergoing carboplatin/Alimta chemotherapy regimen.  Patient presents today to receive cycle 3 of his carboplatin/Alimta chemotherapy regimen.  He continues to complain of some moderate fatigue and deconditioning.  He is in wheelchair during the exam today.  He states that he continues to live at the assisted-living facility; and is unable to obtain physical therapy at this facility due to insurance restrictions.  He is requesting a physical therapy referral here at the hospital today.  Patient states his appetite remains stable.  He denies any recent fevers or chills.  He denies any nausea, vomiting, or diarrhea.  Patient has a chronic rash/lesions to his bilateral lower extremities.  He states he has some chronic ringworm intermittently to his left forearm; he thinks that the lesions to his legs or ringworm as well.  He is a minimal to a dermatology followup.   HPI  CURRENT THERAPY: Upcoming Treatment Dates - LUNG Pemetrexed (Alimta) / Carboplatin q21d Days with orders from any treatment category:  11/29/2013      SCHEDULING COMMUNICATION      ondansetron (ZOFRAN) IVPB 16 mg      Dexamethasone Sodium Phosphate (DECADRON) injection 20 mg      CARBOplatin CHEMO intradermal Test Dose 100 mcg/0.79ml      PEMEtrexed (ALIMTA) 825 mg in sodium chloride 0.9 % 100 mL chemo infusion      CARBOplatin (PARAPLATIN) 490 mg in sodium chloride 0.9 % 250 mL chemo infusion      sodium chloride 0.9 % injection 10 mL      heparin lock flush 100 unit/mL      heparin lock flush 100 unit/mL      alteplase (CATHFLO ACTIVASE) injection 2 mg      sodium chloride 0.9 % injection 3 mL      cyanocobalamin ((VITAMIN B-12)) injection 1,000 mcg      0.9 %  sodium chloride infusion      TREATMENT CONDITIONS 11/30/2013      pegfilgrastim (NEULASTA)  injection 6 mg 12/20/2013      CHL ONC SCHEDULING COMMUNICATION      ondansetron (ZOFRAN) IVPB 16 mg      Dexamethasone Sodium Phosphate (DECADRON) injection 20 mg      CARBOplatin CHEMO intradermal Test Dose 100 mcg/0.28ml      PEMEtrexed (ALIMTA) 825 mg in sodium chloride 0.9 % 100 mL chemo infusion      CARBOplatin (PARAPLATIN) in sodium chloride 0.9 % 100 mL chemo infusion      sodium chloride 0.9 % injection 10 mL      heparin lock flush 100 unit/mL      heparin lock flush 100 unit/mL      alteplase (CATHFLO ACTIVASE) injection 2 mg      sodium chloride 0.9 % injection 3 mL      0.9 %  sodium chloride infusion      TREATMENT CONDITIONS    ROS  Past Medical History  Diagnosis Date  . Alcohol abuse   . Tobacco abuse   . Atrial fibrillation     s/p TEE-DCCV 11/12;  amiodarone started 02/03/11  . Chronic systolic heart failure   . Arthritis     left elbow  . Gout   . Cardiomyopathy     In the setting of afib with rvr 11/12;  Echo 01/19/11:  Diffuse HK worse in Inf wall and septum, mod LVE, mild LVH, EF 30%, mild BAE  . DM2 (diabetes mellitus, type 2)     Hemoglobin A1c 7.1 in 11/12/pt.states he's not diabetic  . 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 Cardioversion  01/20/2011    Procedure: CARDIOVERSION;  Surgeon: Fay Records, MD;  Location: [facility];  Service: Cardiovascular;  Laterality: N/A;  . Tee without cardioversion  01/20/2011    Procedure: TRANSESOPHAGEAL ECHOCARDIOGRAM (TEE);  Surgeon: Fay Records, MD;  Location: [facility] ENDOSCOPY;  Service: Cardiovascular;  Laterality: N/A;  . Cardioversion  01/20/2011    Procedure: CARDIOVERSION;  Surgeon: Fay Records, MD;  Location: [facility];  Service: Cardiovascular;  Laterality: N/A;  . Cardioversion  02/27/2011    Procedure: CARDIOVERSION;  Surgeon: Lelon Perla, MD;  Location: [facility] [place] [facility];  Service: Cardiovascular;  Laterality: N/A;    has  Atrial fibrillation; Chronic systolic heart failure; DM (diabetes mellitus); Tobacco use disorder; Cardiomyopathy, secondary; ETOH abuse; Encounter for long-term (current) use of anticoagulants; Snoring; Gout; Brain metastases; Lung cancer, primary, with metastasis from lung to other site; Physical deconditioning; Rash; Hypoalbuminemia; and Other pancytopenia on his problem list.     has No Known Allergies.    Medication List       This list is accurate as of: 11/22/13  7:05 PM.  Always use your most recent med list.               acetaminophen 325 MG tablet  Commonly known as:  TYLENOL  Take 650 mg by mouth every 6 (six) hours as needed.     amiodarone 200 MG tablet  Commonly known as:  PACERONE  Take 200 mg by mouth daily.     bisacodyl 5 MG EC tablet  Commonly known as:  DULCOLAX  Take 5 mg by mouth daily as needed for moderate constipation.     cephALEXin 500 MG capsule  Commonly known as:  KEFLEX  Take 1 capsule (500 mg total) by mouth 3 (three) times daily. 500mg  TID x 5 days     dexamethasone 4 MG tablet  Commonly known as:  DECADRON  4 mg po bid the day before, day of and day after chemotherapy every 3 weeks     folic acid 1 MG tablet  Commonly known as:  FOLVITE  Take 1 tablet (1 mg total) by mouth daily.     potassium chloride SA 20 MEQ tablet  Commonly known as:  K-DUR,KLOR-CON  Take 1 tablet (20 mEq total) by mouth as directed. Daily x 7 days     prochlorperazine 10 MG tablet  Commonly known as:  COMPAZINE  Take 1 tablet (10 mg total) by mouth every 6 (six) hours as needed for nausea or vomiting.         PHYSICAL EXAMINATION  Blood pressure 111/67, pulse 90, temperature 98 F (36.7 C), temperature source Oral, resp. rate 18, height 5\' 10"  (1.778 m), weight 190 lb 11.2 oz (86.501 kg), SpO2 98.00%.  Physical Exam  Nursing note and vitals reviewed. Constitutional: He is oriented to person, place, and time. Vital signs are normal. He appears  malnourished. He appears unhealthy.  HENT:  Head: Normocephalic and atraumatic.  Mouth/Throat: Oropharynx is clear and moist. No oropharyngeal exudate.  Eyes: Conjunctivae and EOM are normal. Pupils are equal, round, and reactive to light. No  scleral icterus.  Neck: Normal range of motion. Neck supple. No thyromegaly present.  Cardiovascular: Normal rate, regular rhythm, normal heart sounds and intact distal pulses.  Exam reveals no friction rub.   No murmur heard. Pulmonary/Chest: Effort normal and breath sounds normal. No respiratory distress.  Abdominal: Soft. Bowel sounds are normal. He exhibits no distension.  Musculoskeletal: Normal range of motion. He exhibits no edema and no tenderness.  Lymphadenopathy:    He has no cervical adenopathy.  Neurological: He is alert and oriented to person, place, and time.  Skin: Skin is warm and dry. Rash noted.  No specific rash or lesions to left arm noted today.  Patient does have multiple, scattered lesions to his bilateral lower extremities.  No active infection noted.  Patient denies any pruritus to these lesions.  Psychiatric: Affect normal.    LABORATORY DATA:. CBC  Lab Results  Component Value Date   WBC 5.1 11/22/2013   RBC 3.29* 11/22/2013   HGB 9.7* 11/22/2013   HCT 29.2* 11/22/2013   PLT 57* 11/22/2013   MCV 88.7 11/22/2013   MCH 29.5 11/22/2013   MCHC 33.2 11/22/2013   RDW 13.5 11/22/2013   LYMPHSABS 1.1 11/22/2013   MONOABS 1.0* 11/22/2013   EOSABS 0.0 11/22/2013   BASOSABS 0.0 11/22/2013     CMET  Lab Results  Component Value Date   NA 136 11/22/2013   K 4.1 11/22/2013   CL 100 06/06/2013   CO2 25 11/22/2013   GLUCOSE 90 11/22/2013   BUN 10.3 11/22/2013   CREATININE 1.2 11/22/2013   CALCIUM 9.6 11/22/2013   PROT 7.1 11/22/2013   ALBUMIN 2.8* 11/22/2013   AST 25 11/22/2013   ALT 20 11/22/2013   ALKPHOS 115 11/22/2013   BILITOT 0.36 11/22/2013   GFRNONAA 85* 02/27/2011   GFRAA >90 02/27/2011   ASSESSMENT/PLAN:    Tobacco use  disorder  Assessment & Plan Patient continues to smoke; but states he has decreased from 3 packs per day down to less than half a pack per day.  Patient was once again advised to stop smoking altogether if at all possible   Lung cancer, primary, with metastasis from lung to other site  Assessment & Plan Review of patient's labs drawn today reveal that his hemoglobin has improved from 6.72 9.7.  However, his platelets remain low at 57.  Will therefore hold cycle 3 of his carboplatin/Alimta chemotherapy regimen that was scheduled today.  Patient will return next week on Tuesday, 11/29/2013 for labs, cycle 3 of his chemotherapy, and in office visit.  He will receive a Neulasta injection on day 2 of his chemotherapy regimen.  He will also continue with weekly labs.  Also, patient states that he ran out of his dexamethasone.  A refill of the dexamethasone was given to the patient today.  Patient is also scheduled for a restaging brain MRI on 02/03/2014; and radiation oncology followup appointment on 02/06/2014.   Physical deconditioning  Assessment & Plan Patient does appear moderately deconditioned; and was in a wheelchair during the exam today.  He states he is able to in late with assistance of either a cane or walker at his assisted living facility.  He states that he would like to initiate physical therapy; but the physical therapy offered at his assisted living facility is not covered under his insurance.  Will order a physical therapy referral here at the hospital.   Rash  Assessment & Plan Patient reports of the rash/lesions to his bilateral lower extremities  was initially noted in August 2015.  He states that he does have a history of some chronic ringworm to his left forearm.  This may very well be a fungal or ringworm type of rash.  Will order a dermatology referral today.   Hypoalbuminemia  Assessment & Plan Patient's albumin is low today.  Patient was encouraged to push protein  is much as possible.   Other pancytopenia  Assessment & Plan Hemoglobin has stabilized at 9.7.  However, the platelets continue low at 57.  Patient denies any worsening issues with either easy bleeding or bruising.  Will hold chemotherapy today 22 thrombocytopenia.    Patient stated understanding of all instructions; and was in agreement with this plan of care. The patient knows to call the clinic with any problems, questions or concerns.   This was a shared visit with Dr. Julien Nordmann today.  Total time spent with patient was 25 minutes;  with greater than 75 percent of that time spent in face to face counseling regarding his symptoms, and coordination of care and follow up.  Disclaimer: This note was dictated with voice recognition software. Similar sounding words can inadvertently be transcribed and may not be corrected upon review.   Drue Second, NP 11/22/2013   ADDENDUM: Hematology/Oncology Attending: I had a face to face encounter with the patient. I recommended his care plan. This is a very pleasant 53 years old white male with metastatic non-small cell lung cancer currently undergoing systemic chemotherapy with reduced dose carboplatin and Alimta status post 2 cycles. He has no physical complaints with his treatment but unfortunately he has pancytopenia and requirement PRBCs transfusion as well as Neulasta injection after his treatment. His platelets count are still low today. I recommended for the patient to delay the start of cycle #3 by 1 week. He would come back for followup visit in one week for reevaluation and repeat blood work before starting cycle #3. He was advised to call immediately if he has any concerning symptoms in the interval.  Disclaimer: This note was dictated with voice recognition software. Similar sounding words can inadvertently be transcribed and may be missed upon review. Eilleen Kempf., MD 11/23/2013

## 2013-11-22 NOTE — Telephone Encounter (Signed)
Per pharmacy I have scheduled appt

## 2013-11-22 NOTE — Assessment & Plan Note (Signed)
Patient's albumin is low today.  Patient was encouraged to push protein is much as possible.

## 2013-11-22 NOTE — Assessment & Plan Note (Signed)
Patient does appear moderately deconditioned; and was in a wheelchair during the exam today.  He states he is able to in late with assistance of either a cane or walker at his assisted living facility.  He states that he would like to initiate physical therapy; but the physical therapy offered at his assisted living facility is not covered under his insurance.  Will order a physical therapy referral here at the hospital.

## 2013-11-23 ENCOUNTER — Telehealth: Payer: Self-pay | Admitting: *Deleted

## 2013-11-23 ENCOUNTER — Other Ambulatory Visit: Payer: Self-pay | Admitting: Medical Oncology

## 2013-11-23 ENCOUNTER — Ambulatory Visit: Payer: BC Managed Care – PPO

## 2013-11-23 ENCOUNTER — Telehealth: Payer: Self-pay | Admitting: Internal Medicine

## 2013-11-23 DIAGNOSIS — T451X5A Adverse effect of antineoplastic and immunosuppressive drugs, initial encounter: Principal | ICD-10-CM

## 2013-11-23 DIAGNOSIS — R112 Nausea with vomiting, unspecified: Secondary | ICD-10-CM

## 2013-11-23 MED ORDER — DEXAMETHASONE 4 MG PO TABS: ORAL_TABLET | ORAL | Status: DC

## 2013-11-23 NOTE — Telephone Encounter (Signed)
Per staff message and POF I have scheduled appts. Advised scheduler of appts. JMW  JMW

## 2013-11-23 NOTE — Telephone Encounter (Signed)
, °

## 2013-11-23 NOTE — Telephone Encounter (Signed)
Decadron rx faxed to [facility]

## 2013-11-28 ENCOUNTER — Telehealth: Payer: Self-pay | Admitting: Internal Medicine

## 2013-11-28 ENCOUNTER — Other Ambulatory Visit (HOSPITAL_BASED_OUTPATIENT_CLINIC_OR_DEPARTMENT_OTHER): Payer: BC Managed Care – PPO

## 2013-11-28 ENCOUNTER — Ambulatory Visit (HOSPITAL_BASED_OUTPATIENT_CLINIC_OR_DEPARTMENT_OTHER): Payer: BC Managed Care – PPO | Admitting: Physician Assistant

## 2013-11-28 VITALS — BP 99/73 | HR 92 | Temp 98.3°F | Resp 18 | Ht 70.0 in | Wt 190.3 lb

## 2013-11-28 DIAGNOSIS — C3491 Malignant neoplasm of unspecified part of right bronchus or lung: Secondary | ICD-10-CM

## 2013-11-28 DIAGNOSIS — Z23 Encounter for immunization: Secondary | ICD-10-CM

## 2013-11-28 DIAGNOSIS — D702 Other drug-induced agranulocytosis: Secondary | ICD-10-CM

## 2013-11-28 DIAGNOSIS — C3490 Malignant neoplasm of unspecified part of unspecified bronchus or lung: Secondary | ICD-10-CM

## 2013-11-28 LAB — CBC WITH DIFFERENTIAL/PLATELET
BASO%: 0.3 % (ref 0.0–2.0)
BASOS ABS: 0 10*3/uL (ref 0.0–0.1)
EOS ABS: 0 10*3/uL (ref 0.0–0.5)
EOS%: 0.1 % (ref 0.0–7.0)
HCT: 27 % — ABNORMAL LOW (ref 38.4–49.9)
HEMOGLOBIN: 9 g/dL — AB (ref 13.0–17.1)
LYMPH%: 24.1 % (ref 14.0–49.0)
MCH: 30.1 pg (ref 27.2–33.4)
MCHC: 33.3 g/dL (ref 32.0–36.0)
MCV: 90.4 fL (ref 79.3–98.0)
MONO#: 0.9 10*3/uL (ref 0.1–0.9)
MONO%: 22.5 % — AB (ref 0.0–14.0)
NEUT#: 2 10*3/uL (ref 1.5–6.5)
NEUT%: 53 % (ref 39.0–75.0)
PLATELETS: 142 10*3/uL (ref 140–400)
RBC: 2.98 10*6/uL — ABNORMAL LOW (ref 4.20–5.82)
RDW: 13.7 % (ref 11.0–14.6)
WBC: 3.8 10*3/uL — ABNORMAL LOW (ref 4.0–10.3)
lymph#: 0.9 10*3/uL (ref 0.9–3.3)

## 2013-11-28 LAB — COMPREHENSIVE METABOLIC PANEL (CC13)
ALT: 20 U/L (ref 0–55)
AST: 23 U/L (ref 5–34)
Albumin: 2.9 g/dL — ABNORMAL LOW (ref 3.5–5.0)
Alkaline Phosphatase: 105 U/L (ref 40–150)
Anion Gap: 8 mEq/L (ref 3–11)
BUN: 8.7 mg/dL (ref 7.0–26.0)
CALCIUM: 9.7 mg/dL (ref 8.4–10.4)
CO2: 26 meq/L (ref 22–29)
Chloride: 101 mEq/L (ref 98–109)
Creatinine: 1.2 mg/dL (ref 0.7–1.3)
GLUCOSE: 113 mg/dL (ref 70–140)
POTASSIUM: 3.9 meq/L (ref 3.5–5.1)
Sodium: 135 mEq/L — ABNORMAL LOW (ref 136–145)
Total Bilirubin: 0.45 mg/dL (ref 0.20–1.20)
Total Protein: 7.1 g/dL (ref 6.4–8.3)

## 2013-11-28 MED ORDER — INFLUENZA VAC SPLIT QUAD 0.5 ML IM SUSY
0.5000 mL | PREFILLED_SYRINGE | Freq: Once | INTRAMUSCULAR | Status: AC
  Administered 2013-11-28: 0.5 mL via INTRAMUSCULAR
  Filled 2013-11-28: qty 0.5

## 2013-11-28 NOTE — Telephone Encounter (Signed)
gv adn printed appt scheda nd avs for pt for OCT and NOV....sed added tx....gv pt barium

## 2013-11-29 ENCOUNTER — Ambulatory Visit (HOSPITAL_BASED_OUTPATIENT_CLINIC_OR_DEPARTMENT_OTHER): Payer: BC Managed Care – PPO

## 2013-11-29 VITALS — BP 111/71 | HR 86 | Temp 98.1°F | Resp 20

## 2013-11-29 DIAGNOSIS — Z5111 Encounter for antineoplastic chemotherapy: Secondary | ICD-10-CM

## 2013-11-29 DIAGNOSIS — C7970 Secondary malignant neoplasm of unspecified adrenal gland: Secondary | ICD-10-CM

## 2013-11-29 DIAGNOSIS — C7931 Secondary malignant neoplasm of brain: Secondary | ICD-10-CM

## 2013-11-29 DIAGNOSIS — C3491 Malignant neoplasm of unspecified part of right bronchus or lung: Secondary | ICD-10-CM

## 2013-11-29 MED ORDER — SODIUM CHLORIDE 0.9 % IV SOLN
Freq: Once | INTRAVENOUS | Status: AC
  Administered 2013-11-29: 13:00:00 via INTRAVENOUS

## 2013-11-29 MED ORDER — CARBOPLATIN CHEMO INTRADERMAL TEST DOSE 100MCG/0.02ML
100.0000 ug | Freq: Once | INTRADERMAL | Status: AC
  Administered 2013-11-29: 100 ug via INTRADERMAL
  Filled 2013-11-29: qty 0.01

## 2013-11-29 MED ORDER — DEXAMETHASONE SODIUM PHOSPHATE 20 MG/5ML IJ SOLN
INTRAMUSCULAR | Status: AC
  Filled 2013-11-29: qty 5

## 2013-11-29 MED ORDER — SODIUM CHLORIDE 0.9 % IV SOLN
375.0000 mg/m2 | Freq: Once | INTRAVENOUS | Status: AC
  Administered 2013-11-29: 825 mg via INTRAVENOUS
  Filled 2013-11-29: qty 33

## 2013-11-29 MED ORDER — DEXAMETHASONE SODIUM PHOSPHATE 20 MG/5ML IJ SOLN
20.0000 mg | Freq: Once | INTRAMUSCULAR | Status: AC
  Administered 2013-11-29: 20 mg via INTRAVENOUS

## 2013-11-29 MED ORDER — ONDANSETRON 16 MG/50ML IVPB (CHCC)
INTRAVENOUS | Status: AC
  Filled 2013-11-29: qty 16

## 2013-11-29 MED ORDER — SODIUM CHLORIDE 0.9 % IV SOLN
440.0000 mg | Freq: Once | INTRAVENOUS | Status: AC
  Administered 2013-11-29: 440 mg via INTRAVENOUS
  Filled 2013-11-29: qty 44

## 2013-11-29 MED ORDER — ONDANSETRON 16 MG/50ML IVPB (CHCC)
16.0000 mg | Freq: Once | INTRAVENOUS | Status: AC
  Administered 2013-11-29: 16 mg via INTRAVENOUS

## 2013-11-29 MED ORDER — CYANOCOBALAMIN 1000 MCG/ML IJ SOLN
1000.0000 ug | Freq: Once | INTRAMUSCULAR | Status: AC
  Administered 2013-11-29: 1000 ug via INTRAMUSCULAR

## 2013-11-29 MED ORDER — CYANOCOBALAMIN 1000 MCG/ML IJ SOLN
INTRAMUSCULAR | Status: AC
  Filled 2013-11-29: qty 1

## 2013-11-29 NOTE — Progress Notes (Signed)
Carbo skin test negative 5", 15", 30" after intradermal test dose.

## 2013-11-29 NOTE — Patient Instructions (Signed)
[facility] Discharge Instructions for Patients Receiving Chemotherapy  Today you received the following chemotherapy agents:  alimta & carboplatin  To help prevent nausea and vomiting after your treatment, we encourage you to take your nausea medication   If you develop nausea and vomiting that is not controlled by your nausea medication, call the clinic.   BELOW ARE SYMPTOMS THAT SHOULD BE REPORTED IMMEDIATELY:  *FEVER GREATER THAN 100.5 F  *CHILLS WITH OR WITHOUT FEVER  NAUSEA AND VOMITING THAT IS NOT CONTROLLED WITH YOUR NAUSEA MEDICATION  *UNUSUAL SHORTNESS OF BREATH  *UNUSUAL BRUISING OR BLEEDING  TENDERNESS IN MOUTH AND THROAT WITH OR WITHOUT PRESENCE OF ULCERS  *URINARY PROBLEMS  *BOWEL PROBLEMS  UNUSUAL RASH Items with * indicate a potential emergency and should be followed up as soon as possible.  Feel free to call the clinic you have any questions or concerns. The clinic phone number is [phone].

## 2013-11-30 ENCOUNTER — Ambulatory Visit (HOSPITAL_BASED_OUTPATIENT_CLINIC_OR_DEPARTMENT_OTHER): Payer: BC Managed Care – PPO

## 2013-11-30 VITALS — BP 117/77 | HR 103 | Temp 97.9°F

## 2013-11-30 DIAGNOSIS — C7931 Secondary malignant neoplasm of brain: Secondary | ICD-10-CM

## 2013-11-30 DIAGNOSIS — Z08 Encounter for follow-up examination after completed treatment for malignant neoplasm: Secondary | ICD-10-CM

## 2013-11-30 DIAGNOSIS — C3491 Malignant neoplasm of unspecified part of right bronchus or lung: Secondary | ICD-10-CM

## 2013-11-30 MED ORDER — PEGFILGRASTIM INJECTION 6 MG/0.6ML
6.0000 mg | Freq: Once | SUBCUTANEOUS | Status: AC
  Administered 2013-11-30: 6 mg via SUBCUTANEOUS
  Filled 2013-11-30: qty 0.6

## 2013-12-01 NOTE — Patient Instructions (Signed)
You will receive your flu vaccine today as requested  Continue weekly labs as scheduled Followup in 3 weeks with restaging CT scan of the chest, abdomen and pelvis to reevaluate your disease.

## 2013-12-01 NOTE — Progress Notes (Addendum)
No images are attached to the encounter. No scans are attached to the encounter. No scans are attached to the encounter. [facility] SHARED VISIT PROGRESS NOTE  Pcp Not In System No address on file  DIAGNOSIS: Lung cancer, primary, with metastasis from lung to other site   Primary site: Lung (Right)   Staging method: AJCC 7th Edition   Clinical: Stage IV (T1b, N2, M1b) signed by Curt Bears, MD on 10/07/2013  9:33 PM   Summary: Stage IV (T1b, N2, M1b)  PRIOR THERAPY:   CURRENT THERAPY: Systemic chemotherapy with carboplatin for an AUC of 5 and Alimta 500 mg per meter squared given every 3 weeks. Her in with cycle 2 the carboplatin is at an AUC of 4 and the Alimta at 375 mg per meter square given every 3 weeks. Status post 2 cycles.  INTERVAL HISTORY: Bryan Rice 53 y.o. male returns for a scheduled regular symptom management visit for followup of his metastatic non small cell lung cancer, adenocarcinoma. He is status post 2 cycles of systemic chemotherapy with  Carboplatin and Alimta. He is tolerating the chemotherapy without difficulty with the exception of neutropenia after cycle #1. He did receive Granix 480 mcg for 4 days. Start with cycle #2 of his chemotherapy was dose reduced with carboplatin to an AUC of 4 and Alimta at 375 mg per meter square given her 3 weeks. He continues to smoke 6 -10 cigarettes per day, down from 3 packs per day. He reports he is not interested in smoking cessation.He denied chest pain, shortness of breath, cough, or hemoptysis. He denied any significant weight loss or night sweats. He presents to proceed with cycle #3. He requests a flu vaccine today   MEDICAL HISTORY: Past Medical History  Diagnosis Date  . Alcohol abuse   . Tobacco abuse   . Atrial fibrillation     s/p TEE-DCCV 11/12;  amiodarone started 02/03/11  . Chronic systolic heart failure   . Arthritis     left elbow  . Gout   . Cardiomyopathy     In the setting of afib with  rvr 11/12;  Echo 01/19/11:  Diffuse HK worse in Inf wall and septum, mod LVE, mild LVH, EF 30%, mild BAE  . DM2 (diabetes mellitus, type 2)     Hemoglobin A1c 7.1 in 11/12/pt.states he's not diabetic  . 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  MEDICATIONS:  Current Outpatient Prescriptions  Medication Sig Dispense Refill  . amiodarone (PACERONE) 200 MG tablet Take 200 mg by mouth daily.      Marland Kitchen dexamethasone (DECADRON) 4 MG tablet 4 mg po bid the day before, day of and day after chemotherapy every 3 weeks  40 tablet  1  . folic acid (FOLVITE) 1 MG tablet Take 1 tablet (1 mg total) by mouth daily.  30 tablet  3  . potassium chloride SA (K-DUR,KLOR-CON) 20 MEQ tablet Take 1 tablet (20 mEq total) by mouth as directed. Daily x 7 days  7 tablet  0  . prochlorperazine (COMPAZINE) 10 MG tablet Take 1 tablet (10 mg total) by mouth every 6 (six) hours as needed for nausea or vomiting.  30 tablet  0  . acetaminophen (TYLENOL) 325 MG tablet Take 650 mg by mouth every 6 (six) hours as needed.      . bisacodyl (DULCOLAX) 5 MG EC tablet Take  5 mg by mouth daily as needed for moderate constipation.      . cephALEXin (KEFLEX) 500 MG capsule Take 1 capsule (500 mg total) by mouth 3 (three) times daily. 500mg  TID x 5 days  15 capsule  0   No current facility-administered medications for this visit.    SURGICAL HISTORY:  Past Surgical History  Procedure Laterality Date  . Cardioversion  01/20/2011    Procedure: CARDIOVERSION;  Surgeon: Fay Records, MD;  Location: [facility];  Service: Cardiovascular;  Laterality: N/A;  . Tee without cardioversion  01/20/2011    Procedure: TRANSESOPHAGEAL ECHOCARDIOGRAM (TEE);  Surgeon: Fay Records, MD;  Location: [facility] ENDOSCOPY;  Service: Cardiovascular;  Laterality: N/A;  . Cardioversion  01/20/2011    Procedure: CARDIOVERSION;  Surgeon: Fay Records, MD;  Location: [facility];  Service: Cardiovascular;   Laterality: N/A;  . Cardioversion  02/27/2011    Procedure: CARDIOVERSION;  Surgeon: Lelon Perla, MD;  Location: [facility] OR;  Service: Cardiovascular;  Laterality: N/A;    REVIEW OF SYSTEMS:  A comprehensive review of systems was negative.   PHYSICAL EXAMINATION: General appearance: alert, cooperative, appears stated age and no distress Head: Normocephalic, without obvious abnormality, atraumatic Neck: no adenopathy, no carotid bruit, no JVD, supple, symmetrical, trachea midline and thyroid not enlarged, symmetric, no tenderness/mass/nodules Lymph nodes: Cervical, supraclavicular, and axillary nodes normal. Resp: clear to auscultation bilaterally Back: symmetric, no curvature. ROM normal. No CVA tenderness. Cardio: regular rate and rhythm, S1, S2 normal, no murmur, click, rub or gallop GI: soft, non-tender; bowel sounds normal; no masses,  no organomegaly Extremities: extremities normal, atraumatic, no cyanosis or edema Neurologic: Alert and oriented X 3, normal strength and tone. Normal symmetric reflexes. Normal coordination and gait  ECOG PERFORMANCE STATUS: 0 - Asymptomatic  Blood pressure 99/73, pulse 92, temperature 98.3 F (36.8 C), temperature source Oral, resp. rate 18, height 5\' 10"  (1.778 m), weight 190 lb 4.8 oz (86.32 kg).  LABORATORY DATA: Lab Results  Component Value Date   WBC 3.8* 11/28/2013   HGB 9.0* 11/28/2013   HCT 27.0* 11/28/2013   MCV 90.4 11/28/2013   PLT 142 11/28/2013      Chemistry      Component Value Date/Time   NA 135* 11/28/2013 1327   NA 135 06/06/2013 1643   K 3.9 11/28/2013 1327   K 4.3 06/06/2013 1643   CL 100 06/06/2013 1643   CO2 26 11/28/2013 1327   CO2 26 06/06/2013 1643   BUN 8.7 11/28/2013 1327   BUN 14 06/06/2013 1643   CREATININE 1.2 11/28/2013 1327   CREATININE 1.1 06/06/2013 1643      Component Value Date/Time   CALCIUM 9.7 11/28/2013 1327   CALCIUM 9.4 06/06/2013 1643   ALKPHOS 105 11/28/2013 1327   ALKPHOS 79 06/06/2013 1643   AST 23  11/28/2013 1327   AST 20 06/06/2013 1643   ALT 20 11/28/2013 1327   ALT 19 06/06/2013 1643   BILITOT 0.45 11/28/2013 1327   BILITOT 0.5 06/06/2013 1643       RADIOGRAPHIC STUDIES:  Ct Chest W Contrast  10/10/2013   CLINICAL DATA:  Metastatic right lung cancer diagnosed in 2014. Osseous metastatic disease diagnosed 1 month ago. Restarting chemotherapy. Completed radiation therapy to the brain. Cough. Weight loss. Constipation.  EXAM: CT CHEST, ABDOMEN, AND PELVIS WITH CONTRAST  TECHNIQUE: Multidetector CT imaging of the chest, abdomen and pelvis was performed following the standard protocol during bolus administration of intravenous contrast.  CONTRAST:  154mL  OMNIPAQUE IOHEXOL 300 MG/ML  SOLN  COMPARISON:  09/13/2012  FINDINGS: CT CHEST FINDINGS  Considerable mediastinal adenopathy is observed along with right hilar adenopathy and bilateral infrahilar adenopathy. Index AP window lymph node 2 cm in short axis dimension. Index right hilar node 1.7 cm in short axis dimension. The right hilar adenopathy causes mild extrinsic narrowing of right upper lobe pulmonary vasculature.  Superior segment right lower lobe nodule, 2.9 x 2.2 x 2.4 cm, with mild adjacent atelectasis.  Numerous old left-sided rib fractures are present, some demonstrating nonunion. Old healed right posterior rib fractures are also present.  There is airspace opacity in a bandlike configuration in the right upper lobe with considerable airway thickening in the right upper lobe and a lesser degree of airway thickening in the right lower lobe.  4 mm left lower lobe nodule, image 42 series 5. Scattered faint nodularity in the right upper lobe.  CT ABDOMEN AND PELVIS FINDINGS  Right adrenal mass, 5.4 x 2.5 cm.  Left adrenal mass, 3.6 x 2.8 cm.  The liver, spleen, and pancreas appear unremarkable. A nodule peripheral to the right posterior perirenal fascia measures 1 cm on image 77 of series 2.  Aortoiliac atherosclerotic vascular disease. Right  external iliac node 1.3 cm in short axis, image 106 of series 2. Heterogeneous right inguinal lymph node 1.5 cm, image 124 of series 2. Fatty bilateral spermatic cords. Urinary bladder unremarkable. Sclerosis in both femoral heads compatible with avascular necrosis. Spurring of both sacroiliac joints.  9 mm of anterolisthesis at L5-S1 with associated bilateral pars defects and severe right and moderate left foraminal stenosis. Dextroconvex upper lumbar scoliosis with left foraminal spurring at the L1-2 level causing foraminal impingement.  The reported prior osseous metastatic disease is not well observed in the chest, abdomen, or bony pelvis -if prior exams can be provided we are happy to issue a comparison.  IMPRESSION: 1. And large superior segment right lower lobe nodule with considerable mediastinal adenopathy, right hilar adenopathy, bilateral infrahilar adenopathy, bilateral adrenal mass is suspicious for metastatic disease, a small left lower lobe pulmonary nodule, and a small lymph node or nodule posterior to the right posterior perirenal fascia. There is also pathologic right external iliac and right inguinal adenopathy. 2. Ancillary findings include atherosclerosis, lumbar scoliosis and spondylosis with pars defects at L5 and foraminal impingement at L1-2 and L5-S1, bilateral hip avascular necrosis, and airspace opacity in a bandlike configuration in the right upper lobe which could be from pneumonia, unusual spread of malignancy, or most likely radiation pneumonitis given the associated airway thickening.   Electronically Signed   By: Sherryl Barters M.D.   On: 10/10/2013 14:08   Ct Abdomen Pelvis W Contrast  10/10/2013   CLINICAL DATA:  Metastatic right lung cancer diagnosed in 2014. Osseous metastatic disease diagnosed 1 month ago. Restarting chemotherapy. Completed radiation therapy to the brain. Cough. Weight loss. Constipation.  EXAM: CT CHEST, ABDOMEN, AND PELVIS WITH CONTRAST  TECHNIQUE:  Multidetector CT imaging of the chest, abdomen and pelvis was performed following the standard protocol during bolus administration of intravenous contrast.  CONTRAST:  167mL OMNIPAQUE IOHEXOL 300 MG/ML  SOLN  COMPARISON:  09/13/2012  FINDINGS: CT CHEST FINDINGS  Considerable mediastinal adenopathy is observed along with right hilar adenopathy and bilateral infrahilar adenopathy. Index AP window lymph node 2 cm in short axis dimension. Index right hilar node 1.7 cm in short axis dimension. The right hilar adenopathy causes mild extrinsic narrowing of right upper lobe pulmonary vasculature.  Superior  segment right lower lobe nodule, 2.9 x 2.2 x 2.4 cm, with mild adjacent atelectasis.  Numerous old left-sided rib fractures are present, some demonstrating nonunion. Old healed right posterior rib fractures are also present.  There is airspace opacity in a bandlike configuration in the right upper lobe with considerable airway thickening in the right upper lobe and a lesser degree of airway thickening in the right lower lobe.  4 mm left lower lobe nodule, image 42 series 5. Scattered faint nodularity in the right upper lobe.  CT ABDOMEN AND PELVIS FINDINGS  Right adrenal mass, 5.4 x 2.5 cm.  Left adrenal mass, 3.6 x 2.8 cm.  The liver, spleen, and pancreas appear unremarkable. A nodule peripheral to the right posterior perirenal fascia measures 1 cm on image 77 of series 2.  Aortoiliac atherosclerotic vascular disease. Right external iliac node 1.3 cm in short axis, image 106 of series 2. Heterogeneous right inguinal lymph node 1.5 cm, image 124 of series 2. Fatty bilateral spermatic cords. Urinary bladder unremarkable. Sclerosis in both femoral heads compatible with avascular necrosis. Spurring of both sacroiliac joints.  9 mm of anterolisthesis at L5-S1 with associated bilateral pars defects and severe right and moderate left foraminal stenosis. Dextroconvex upper lumbar scoliosis with left foraminal spurring at the  L1-2 level causing foraminal impingement.  The reported prior osseous metastatic disease is not well observed in the chest, abdomen, or bony pelvis -if prior exams can be provided we are happy to issue a comparison.  IMPRESSION: 1. And large superior segment right lower lobe nodule with considerable mediastinal adenopathy, right hilar adenopathy, bilateral infrahilar adenopathy, bilateral adrenal mass is suspicious for metastatic disease, a small left lower lobe pulmonary nodule, and a small lymph node or nodule posterior to the right posterior perirenal fascia. There is also pathologic right external iliac and right inguinal adenopathy. 2. Ancillary findings include atherosclerosis, lumbar scoliosis and spondylosis with pars defects at L5 and foraminal impingement at L1-2 and L5-S1, bilateral hip avascular necrosis, and airspace opacity in a bandlike configuration in the right upper lobe which could be from pneumonia, unusual spread of malignancy, or most likely radiation pneumonitis given the associated airway thickening.   Electronically Signed   By: Sherryl Barters M.D.   On: 10/10/2013 14:08     ASSESSMENT/PLAN: The patient is a pleasant 53 year old Caucasian male with metastatic non small cell lung cancer, adenocarcinoma. He is status post 2 cycles of chemotherapy with carboplatin and alimta. His carboplatin was reduced to an AUC of 4 and Alimta use to 375 mg per meter squared given every 3 weeks starting with cycle #2. He'll proceed with dose reduced chemotherapy for cycle #3 as scheduled. He'll also continue with Neulasta on day 2 of each cycle for neutrophil support. He'll followup in 3 weeks with a restaging CT scan of the chest, abdomen and pelvis with contrast to reevaluate his disease. Flu vaccine today as requested. He'll continue with weekly labs as scheduled in the interim   All questions were answered. The patient knows to call the clinic with any problems, questions or concerns. We can  certainly see the patient much sooner if necessary.  Carlton Adam PA-C  ADDENDUM: Hematology/Oncology Attending: I had a face to face encounter with the patient. I recommended his care plan. This is a very pleasant 53 years old white male with metastatic non-small cell lung cancer, adenocarcinoma. He is currently undergoing systemic chemotherapy with reduced dose carboplatin and Alimta status post 2 cycles. He  is tolerating his treatment fairly well except for the pancytopenia and the patient received Neulasta injection on day 2 after every cycle because of the neutropenia. Will proceed with cycle #3 today as scheduled. The patient would come back for followup visit in 3 weeks with repeat CT scan of the chest, abdomen and pelvis for restaging of his disease. He was advised to call immediately if he has any concerning symptoms in the interval.  Disclaimer: This note was dictated with voice recognition software. Similar sounding words can inadvertently be transcribed and may be missed upon review. Eilleen Kempf., MD 12/02/2013

## 2013-12-06 ENCOUNTER — Ambulatory Visit: Payer: BC Managed Care – PPO | Admitting: Physician Assistant

## 2013-12-06 ENCOUNTER — Other Ambulatory Visit: Payer: BC Managed Care – PPO

## 2013-12-06 ENCOUNTER — Other Ambulatory Visit (HOSPITAL_BASED_OUTPATIENT_CLINIC_OR_DEPARTMENT_OTHER): Payer: BC Managed Care – PPO

## 2013-12-06 DIAGNOSIS — C3491 Malignant neoplasm of unspecified part of right bronchus or lung: Secondary | ICD-10-CM

## 2013-12-06 LAB — CBC WITH DIFFERENTIAL/PLATELET
BASO%: 0 % (ref 0.0–2.0)
Basophils Absolute: 0 10*3/uL (ref 0.0–0.1)
EOS ABS: 0 10*3/uL (ref 0.0–0.5)
EOS%: 1.4 % (ref 0.0–7.0)
HCT: 22.4 % — ABNORMAL LOW (ref 38.4–49.9)
HGB: 7.5 g/dL — ABNORMAL LOW (ref 13.0–17.1)
LYMPH#: 0.6 10*3/uL — AB (ref 0.9–3.3)
LYMPH%: 44.1 % (ref 14.0–49.0)
MCH: 30 pg (ref 27.2–33.4)
MCHC: 33.5 g/dL (ref 32.0–36.0)
MCV: 89.6 fL (ref 79.3–98.0)
MONO#: 0.1 10*3/uL (ref 0.1–0.9)
MONO%: 4.8 % (ref 0.0–14.0)
NEUT%: 49.7 % (ref 39.0–75.0)
NEUTROS ABS: 0.7 10*3/uL — AB (ref 1.5–6.5)
PLATELETS: 42 10*3/uL — AB (ref 140–400)
RBC: 2.5 10*6/uL — AB (ref 4.20–5.82)
RDW: 14.9 % — AB (ref 11.0–14.6)
WBC: 1.5 10*3/uL — AB (ref 4.0–10.3)

## 2013-12-06 LAB — COMPREHENSIVE METABOLIC PANEL (CC13)
ALT: 27 U/L (ref 0–55)
AST: 18 U/L (ref 5–34)
Albumin: 2.8 g/dL — ABNORMAL LOW (ref 3.5–5.0)
Alkaline Phosphatase: 97 U/L (ref 40–150)
Anion Gap: 7 mEq/L (ref 3–11)
BILIRUBIN TOTAL: 1 mg/dL (ref 0.20–1.20)
BUN: 24.2 mg/dL (ref 7.0–26.0)
CHLORIDE: 104 meq/L (ref 98–109)
CO2: 24 mEq/L (ref 22–29)
Calcium: 9 mg/dL (ref 8.4–10.4)
Creatinine: 1.3 mg/dL (ref 0.7–1.3)
Glucose: 113 mg/dl (ref 70–140)
Potassium: 4 mEq/L (ref 3.5–5.1)
SODIUM: 135 meq/L — AB (ref 136–145)
TOTAL PROTEIN: 6.4 g/dL (ref 6.4–8.3)

## 2013-12-12 ENCOUNTER — Telehealth: Payer: Self-pay | Admitting: *Deleted

## 2013-12-12 DIAGNOSIS — C3490 Malignant neoplasm of unspecified part of unspecified bronchus or lung: Secondary | ICD-10-CM

## 2013-12-12 NOTE — Telephone Encounter (Signed)
Pt called stating he does not want to drink the oral contrast for the CT scan of the abdomen.  It made him sick las time.  Asked if he would be interested in the water based contrast.  He declined and said he is okay not having contrast for the abdominal CT.  Informed him it will make it harder to see any changes.  Pt states he is okay with that.  Will inform Dr Vista Mink.  Order sent to radiology, called and informed Tedra Coupe.

## 2013-12-13 ENCOUNTER — Other Ambulatory Visit: Payer: Self-pay | Admitting: Medical Oncology

## 2013-12-13 ENCOUNTER — Ambulatory Visit (HOSPITAL_COMMUNITY)
Admission: RE | Admit: 2013-12-13 | Discharge: 2013-12-13 | Disposition: A | Discharge status: Home / Self Care | Payer: BC Managed Care – PPO | Source: Ambulatory Visit | Attending: Internal Medicine | Admitting: Internal Medicine

## 2013-12-13 ENCOUNTER — Other Ambulatory Visit (HOSPITAL_BASED_OUTPATIENT_CLINIC_OR_DEPARTMENT_OTHER): Payer: Self-pay

## 2013-12-13 ENCOUNTER — Other Ambulatory Visit: Payer: Self-pay | Admitting: *Deleted

## 2013-12-13 ENCOUNTER — Ambulatory Visit (HOSPITAL_BASED_OUTPATIENT_CLINIC_OR_DEPARTMENT_OTHER): Payer: Self-pay

## 2013-12-13 ENCOUNTER — Ambulatory Visit: Payer: BC Managed Care – PPO

## 2013-12-13 ENCOUNTER — Telehealth: Payer: Self-pay | Admitting: *Deleted

## 2013-12-13 ENCOUNTER — Ambulatory Visit: Payer: Self-pay

## 2013-12-13 VITALS — BP 142/75 | HR 69 | Temp 96.8°F | Resp 18

## 2013-12-13 DIAGNOSIS — I5022 Chronic systolic (congestive) heart failure: Secondary | ICD-10-CM | POA: Insufficient documentation

## 2013-12-13 DIAGNOSIS — D6481 Anemia due to antineoplastic chemotherapy: Secondary | ICD-10-CM | POA: Insufficient documentation

## 2013-12-13 DIAGNOSIS — T451X5A Adverse effect of antineoplastic and immunosuppressive drugs, initial encounter: Principal | ICD-10-CM

## 2013-12-13 DIAGNOSIS — C7931 Secondary malignant neoplasm of brain: Secondary | ICD-10-CM | POA: Insufficient documentation

## 2013-12-13 DIAGNOSIS — F17200 Nicotine dependence, unspecified, uncomplicated: Secondary | ICD-10-CM | POA: Insufficient documentation

## 2013-12-13 DIAGNOSIS — E119 Type 2 diabetes mellitus without complications: Secondary | ICD-10-CM | POA: Insufficient documentation

## 2013-12-13 DIAGNOSIS — D649 Anemia, unspecified: Secondary | ICD-10-CM

## 2013-12-13 DIAGNOSIS — D696 Thrombocytopenia, unspecified: Secondary | ICD-10-CM

## 2013-12-13 DIAGNOSIS — C3491 Malignant neoplasm of unspecified part of right bronchus or lung: Secondary | ICD-10-CM | POA: Insufficient documentation

## 2013-12-13 DIAGNOSIS — I429 Cardiomyopathy, unspecified: Secondary | ICD-10-CM | POA: Insufficient documentation

## 2013-12-13 DIAGNOSIS — C7951 Secondary malignant neoplasm of bone: Secondary | ICD-10-CM

## 2013-12-13 LAB — COMPREHENSIVE METABOLIC PANEL (CC13)
ALT: 20 U/L (ref 0–55)
AST: 20 U/L (ref 5–34)
Albumin: 2.9 g/dL — ABNORMAL LOW (ref 3.5–5.0)
Alkaline Phosphatase: 114 U/L (ref 40–150)
Anion Gap: 8 meq/L (ref 3–11)
BUN: 11.8 mg/dL (ref 7.0–26.0)
CO2: 23 meq/L (ref 22–29)
Calcium: 9.4 mg/dL (ref 8.4–10.4)
Chloride: 106 meq/L (ref 98–109)
Creatinine: 1.3 mg/dL (ref 0.7–1.3)
Glucose: 100 mg/dL (ref 70–140)
Potassium: 3.9 meq/L (ref 3.5–5.1)
Sodium: 137 meq/L (ref 136–145)
Total Bilirubin: 0.37 mg/dL (ref 0.20–1.20)
Total Protein: 6.6 g/dL (ref 6.4–8.3)

## 2013-12-13 LAB — CBC WITH DIFFERENTIAL/PLATELET
BASO%: 0 % (ref 0.0–2.0)
Basophils Absolute: 0 10*3/uL (ref 0.0–0.1)
EOS%: 0.5 % (ref 0.0–7.0)
Eosinophils Absolute: 0 10*3/uL (ref 0.0–0.5)
HEMATOCRIT: 18.9 % — AB (ref 38.4–49.9)
HGB: 6.4 g/dL — CL (ref 13.0–17.1)
LYMPH#: 1.2 10*3/uL (ref 0.9–3.3)
LYMPH%: 27.3 % (ref 14.0–49.0)
MCH: 30 pg (ref 27.2–33.4)
MCHC: 33.9 g/dL (ref 32.0–36.0)
MCV: 88.7 fL (ref 79.3–98.0)
MONO#: 0.5 10*3/uL (ref 0.1–0.9)
MONO%: 11.8 % (ref 0.0–14.0)
NEUT#: 2.7 10*3/uL (ref 1.5–6.5)
NEUT%: 60.4 % (ref 39.0–75.0)
NRBC: 0 % (ref 0–0)
Platelets: 6 10*3/uL — CL (ref 140–400)
RBC: 2.13 10*6/uL — AB (ref 4.20–5.82)
RDW: 14.7 % — ABNORMAL HIGH (ref 11.0–14.6)
WBC: 4.4 10*3/uL (ref 4.0–10.3)

## 2013-12-13 LAB — PREPARE RBC (CROSSMATCH)

## 2013-12-13 MED ORDER — DIPHENHYDRAMINE HCL 25 MG PO CAPS
ORAL_CAPSULE | ORAL | Status: AC
  Filled 2013-12-13: qty 1

## 2013-12-13 MED ORDER — SODIUM CHLORIDE 0.9 % IV SOLN
250.0000 mL | Freq: Once | INTRAVENOUS | Status: AC
  Administered 2013-12-13: 250 mL via INTRAVENOUS

## 2013-12-13 MED ORDER — ACETAMINOPHEN 325 MG PO TABS
650.0000 mg | ORAL_TABLET | Freq: Once | ORAL | Status: AC
  Administered 2013-12-13: 650 mg via ORAL

## 2013-12-13 MED ORDER — ACETAMINOPHEN 325 MG PO TABS
ORAL_TABLET | ORAL | Status: AC
  Filled 2013-12-13: qty 2

## 2013-12-13 MED ORDER — DIPHENHYDRAMINE HCL 25 MG PO CAPS
25.0000 mg | ORAL_CAPSULE | Freq: Once | ORAL | Status: AC
  Administered 2013-12-13: 25 mg via ORAL

## 2013-12-13 NOTE — Patient Instructions (Signed)
Blood Transfusion Information WHAT IS A BLOOD TRANSFUSION? A transfusion is the replacement of blood or some of its parts. Blood is made up of multiple cells which provide different functions.  Red blood cells carry oxygen and are used for blood loss replacement.  White blood cells fight against infection.  Platelets control bleeding.  Plasma helps clot blood.  Other blood products are available for specialized needs, such as hemophilia or other clotting disorders. BEFORE THE TRANSFUSION  Who gives blood for transfusions?   You may be able to donate blood to be used at a later date on yourself (autologous donation).  Relatives can be asked to donate blood. This is generally not any safer than if you have received blood from a stranger. The same precautions are taken to ensure safety when a relative's blood is donated.  Healthy volunteers who are fully evaluated to make sure their blood is safe. This is blood bank blood. Transfusion therapy is the safest it has ever been in the practice of medicine. Before blood is taken from a donor, a complete history is taken to make sure that person has no history of diseases nor engages in risky social behavior (examples are intravenous drug use or sexual activity with multiple partners). The donor's travel history is screened to minimize risk of transmitting infections, such as malaria. The donated blood is tested for signs of infectious diseases, such as HIV and hepatitis. The blood is then tested to be sure it is compatible with you in order to minimize the chance of a transfusion reaction. If you or a relative donates blood, this is often done in anticipation of surgery and is not appropriate for emergency situations. It takes many days to process the donated blood. RISKS AND COMPLICATIONS Although transfusion therapy is very safe and saves many lives, the main dangers of transfusion include:   Getting an infectious disease.  Developing a  transfusion reaction. This is an allergic reaction to something in the blood you were given. Every precaution is taken to prevent this. The decision to have a blood transfusion has been considered carefully by your caregiver before blood is given. Blood is not given unless the benefits outweigh the risks. AFTER THE TRANSFUSION  Right after receiving a blood transfusion, you will usually feel much better and more energetic. This is especially true if your red blood cells have gotten low (anemic). The transfusion raises the level of the red blood cells which carry oxygen, and this usually causes an energy increase.  The nurse administering the transfusion will monitor you carefully for complications. HOME CARE INSTRUCTIONS  No special instructions are needed after a transfusion. You may find your energy is better. Speak with your caregiver about any limitations on activity for underlying diseases you may have. SEEK MEDICAL CARE IF:   Your condition is not improving after your transfusion.  You develop redness or irritation at the intravenous (IV) site. SEEK IMMEDIATE MEDICAL CARE IF:  Any of the following symptoms occur over the next 12 hours:  Shaking chills.  You have a temperature by mouth above 102 F (38.9 C), not controlled by medicine.  Chest, back, or muscle pain.  People around you feel you are not acting correctly or are confused.  Shortness of breath or difficulty breathing.  Dizziness and fainting.  You get a rash or develop hives.  You have a decrease in urine output.  Your urine turns a dark color or changes to pink, red, or brown. Any of the following   symptoms occur over the next 10 days:  You have a temperature by mouth above 102 F (38.9 C), not controlled by medicine.  Shortness of breath.  Weakness after normal activity.  The white part of the eye turns yellow (jaundice).  You have a decrease in the amount of urine or are urinating less often.  Your  urine turns a dark color or changes to pink, red, or brown. Document Released: 02/08/2000 Document Revised: 05/05/2011 Document Reviewed: 09/27/2007 [facility] Patient Information 2015 [place], [place]. This information is not intended to replace advice given to you by your health care provider. Make sure you discuss any questions you have with your health care provider.  Platelet Transfusion Information This is information about transfusions of platelets. Platelets are tiny cells made by the bone marrow and found in the blood. When a blood vessel is damaged, platelets rush to the damaged area to help form a clot. This begins the healing process. When platelets get very low, your blood may have trouble clotting. This may be from:  Illness.  Blood disorder.  Chemotherapy to treat cancer. Often, lower platelet counts do not cause problems.  Platelets usually last for 7 to 10 days. If they are not used in an injury, they are broken down by the liver or spleen. Symptoms of low platelet count include:  Nosebleeds.  Bleeding gums.  Heavy periods.  Bruising and tiny blood spots in the skin.  Pinpoint spots of bleeding (petechiae).  Larger bruises (purpura).  Bleeding can be more serious if it happens in the brain or bowel. Platelet transfusions are often used to keep the platelet count at an acceptable level. Serious bleeding due to low platelets is uncommon. RISKS AND COMPLICATIONS Severe side effects from platelet transfusions are uncommon. Minor reactions may include:  Itching.  Rashes.  High temperature and shivering. Medications are available to stop transfusion reactions. Let your health care provider know if you develop any of the above problems.  If you are having platelet transfusions frequently, they may get less effective. This is called becoming refractory to platelets. It is uncommon. This can happen from non-immune causes and immune causes. Non-immune causes  include:  High temperatures.  Some medications.  An enlarged spleen. Immune causes happen when your body discovers the platelets are not your own and begins making antibodies against them. The antibodies kill the platelets quickly. Even with platelet transfusions, you may still notice problems with bleeding or bruising. Let your health care providers know about this. Other things can be done to help if this happens.  BEFORE THE PROCEDURE   Your health care provider will check your platelet count regularly.  If the platelet count is too low, it may be necessary to have a platelet transfusion.  This is more important before certain procedures with a risk of bleeding, such as a spinal tap.  Platelet transfusion reduces the risk of bleeding during or after the procedure.  Except in emergencies, giving a transfusion requires a written consent. Before blood is taken from a donor, a complete history is taken to make sure the person has no history of previous diseases, nor engages in risky social behavior. Examples of this are intravenous drug use or sexual activity with multiple partners. This could lead to infected blood or blood products being used. This history is taken in spite of the extensive testing to make sure the blood is safe. All blood products transfused are tested to make sure it is a match for the person getting  the blood. It is also checked for infections. Blood is the safest it has ever been. The risk of getting an infection is very low. PROCEDURE  The platelets are stored in small plastic bags that are kept at a low temperature.  Each bag is called a unit and sometimes two units are given. They are given through an intravenous line by drip infusion over about one-half hour.  Usually blood is collected from multiple people to get enough to transfuse.  Sometimes, the platelets are collected from a single person. This is done using a special machine that separates the platelets  from the blood. The machine is called an apheresis machine. Platelets collected in this way are called apheresed platelets. Apheresed platelets reduce the risk of becoming sensitive to the platelets. This lowers the chances of having a transfusion reaction.  As it only takes a short time to give the platelets, this treatment can be given in an outpatient department. Platelets can also be given before or after other treatments. SEEK IMMEDIATE MEDICAL CARE IF: You have any of the following symptoms over the next 12 hours or several days:  Shaking chills.  Fever with a temperature greater than 102F (38.9C) develops.  Back pain or muscle pain.  People around you feel you are not acting correctly, or you are confused.  Blood in the urine or bowel movements, or bleeding from any place in your body.  Shortness of breath, or difficulty breathing.  Dizziness.  Fainting.  You break out in a rash or develop hives.  Decrease in the amount of urine you are putting out, or the urine turns a dark color or changes to pink, red, or brown.  A severe headache or stiff neck.  Bruising more easily. Document Released: 12/08/2006 Document Revised: 06/27/2013 Document Reviewed: 12/08/2006 [facility] Patient Information 2015 [place], [place]. This information is not intended to replace advice given to you by your health care provider. Make sure you discuss any questions you have with your health care provider.

## 2013-12-13 NOTE — Telephone Encounter (Signed)
Hbg 6.4, plts 6, per Dr Vista Mink pt needs 2 units blood and 1 unit plts.  Pt is currently asymptomatic.  He is requesting to have plts and blood tomorrow.  Per Dr Vista Mink, should have at least 1 unit blood and 1 unit plts today.  Pt is having transportation bring pt back at 1 pm for appts.

## 2013-12-14 ENCOUNTER — Encounter: Payer: Self-pay | Admitting: Medical Oncology

## 2013-12-14 ENCOUNTER — Other Ambulatory Visit: Payer: Self-pay | Admitting: Medical Oncology

## 2013-12-14 ENCOUNTER — Other Ambulatory Visit: Payer: Self-pay | Admitting: *Deleted

## 2013-12-14 ENCOUNTER — Ambulatory Visit (HOSPITAL_BASED_OUTPATIENT_CLINIC_OR_DEPARTMENT_OTHER): Payer: Self-pay

## 2013-12-14 ENCOUNTER — Telehealth: Payer: Self-pay | Admitting: Medical Oncology

## 2013-12-14 VITALS — BP 123/70 | HR 61 | Temp 97.8°F | Resp 18

## 2013-12-14 DIAGNOSIS — D649 Anemia, unspecified: Secondary | ICD-10-CM

## 2013-12-14 LAB — PREPARE PLATELET PHERESIS: Unit division: 0

## 2013-12-14 MED ORDER — ACETAMINOPHEN 325 MG PO TABS
650.0000 mg | ORAL_TABLET | Freq: Once | ORAL | Status: AC
  Administered 2013-12-14: 650 mg via ORAL

## 2013-12-14 MED ORDER — DIPHENHYDRAMINE HCL 25 MG PO CAPS
25.0000 mg | ORAL_CAPSULE | Freq: Once | ORAL | Status: AC
  Administered 2013-12-14: 25 mg via ORAL

## 2013-12-14 MED ORDER — DIPHENHYDRAMINE HCL 25 MG PO CAPS
ORAL_CAPSULE | ORAL | Status: AC
  Filled 2013-12-14: qty 1

## 2013-12-14 MED ORDER — ACETAMINOPHEN 325 MG PO TABS
ORAL_TABLET | ORAL | Status: AC
  Filled 2013-12-14: qty 2

## 2013-12-14 NOTE — Patient Instructions (Signed)

## 2013-12-14 NOTE — Telephone Encounter (Signed)
I called pt and told him we are working on scheduling the blood transfusion for today or tomorrow and to wait for a call about appt.

## 2013-12-15 LAB — TYPE AND SCREEN
ABO/RH(D): B POS
Antibody Screen: NEGATIVE
UNIT DIVISION: 0
Unit division: 0

## 2013-12-16 ENCOUNTER — Encounter (HOSPITAL_COMMUNITY): Payer: Self-pay

## 2013-12-16 ENCOUNTER — Ambulatory Visit (HOSPITAL_COMMUNITY)
Admission: RE | Admit: 2013-12-16 | Discharge: 2013-12-16 | Disposition: A | Discharge status: Home / Self Care | Payer: BC Managed Care – PPO | Source: Ambulatory Visit | Attending: Physician Assistant | Admitting: Physician Assistant

## 2013-12-16 DIAGNOSIS — C7972 Secondary malignant neoplasm of left adrenal gland: Secondary | ICD-10-CM | POA: Insufficient documentation

## 2013-12-16 DIAGNOSIS — C3431 Malignant neoplasm of lower lobe, right bronchus or lung: Secondary | ICD-10-CM | POA: Insufficient documentation

## 2013-12-16 DIAGNOSIS — R590 Localized enlarged lymph nodes: Secondary | ICD-10-CM | POA: Insufficient documentation

## 2013-12-16 DIAGNOSIS — C3490 Malignant neoplasm of unspecified part of unspecified bronchus or lung: Secondary | ICD-10-CM

## 2013-12-16 DIAGNOSIS — C7971 Secondary malignant neoplasm of right adrenal gland: Secondary | ICD-10-CM | POA: Insufficient documentation

## 2013-12-16 MED ORDER — IOHEXOL 300 MG/ML  SOLN
100.0000 mL | Freq: Once | INTRAMUSCULAR | Status: AC | PRN
  Administered 2013-12-16: 100 mL via INTRAVENOUS

## 2013-12-18 ENCOUNTER — Other Ambulatory Visit: Payer: Self-pay | Admitting: Internal Medicine

## 2013-12-20 ENCOUNTER — Other Ambulatory Visit (HOSPITAL_BASED_OUTPATIENT_CLINIC_OR_DEPARTMENT_OTHER): Payer: BC Managed Care – PPO

## 2013-12-20 ENCOUNTER — Ambulatory Visit: Payer: BC Managed Care – PPO

## 2013-12-20 ENCOUNTER — Ambulatory Visit (HOSPITAL_BASED_OUTPATIENT_CLINIC_OR_DEPARTMENT_OTHER): Payer: BC Managed Care – PPO | Admitting: Physician Assistant

## 2013-12-20 ENCOUNTER — Encounter: Payer: Self-pay | Admitting: Physician Assistant

## 2013-12-20 ENCOUNTER — Telehealth: Payer: Self-pay | Admitting: *Deleted

## 2013-12-20 ENCOUNTER — Telehealth: Payer: Self-pay | Admitting: Physician Assistant

## 2013-12-20 VITALS — BP 104/72 | HR 94 | Temp 97.7°F | Resp 20 | Ht 70.0 in | Wt 189.9 lb

## 2013-12-20 DIAGNOSIS — C7951 Secondary malignant neoplasm of bone: Secondary | ICD-10-CM

## 2013-12-20 DIAGNOSIS — C799 Secondary malignant neoplasm of unspecified site: Secondary | ICD-10-CM

## 2013-12-20 DIAGNOSIS — C3491 Malignant neoplasm of unspecified part of right bronchus or lung: Secondary | ICD-10-CM

## 2013-12-20 LAB — COMPREHENSIVE METABOLIC PANEL (CC13)
ALT: 16 U/L (ref 0–55)
ANION GAP: 7 meq/L (ref 3–11)
AST: 19 U/L (ref 5–34)
Albumin: 2.9 g/dL — ABNORMAL LOW (ref 3.5–5.0)
Alkaline Phosphatase: 109 U/L (ref 40–150)
BILIRUBIN TOTAL: 0.44 mg/dL (ref 0.20–1.20)
BUN: 12.4 mg/dL (ref 7.0–26.0)
CO2: 25 meq/L (ref 22–29)
CREATININE: 1.2 mg/dL (ref 0.7–1.3)
Calcium: 9.5 mg/dL (ref 8.4–10.4)
Chloride: 106 mEq/L (ref 98–109)
GLUCOSE: 83 mg/dL (ref 70–140)
Potassium: 3.9 mEq/L (ref 3.5–5.1)
Sodium: 138 mEq/L (ref 136–145)
Total Protein: 6.8 g/dL (ref 6.4–8.3)

## 2013-12-20 LAB — CBC WITH DIFFERENTIAL/PLATELET
BASO%: 0.4 % (ref 0.0–2.0)
BASOS ABS: 0 10*3/uL (ref 0.0–0.1)
EOS%: 0.3 % (ref 0.0–7.0)
Eosinophils Absolute: 0 10*3/uL (ref 0.0–0.5)
HEMATOCRIT: 26.3 % — AB (ref 38.4–49.9)
HGB: 8.6 g/dL — ABNORMAL LOW (ref 13.0–17.1)
LYMPH%: 23.9 % (ref 14.0–49.0)
MCH: 29.8 pg (ref 27.2–33.4)
MCHC: 32.9 g/dL (ref 32.0–36.0)
MCV: 90.6 fL (ref 79.3–98.0)
MONO#: 0.7 10*3/uL (ref 0.1–0.9)
MONO%: 15.6 % — ABNORMAL HIGH (ref 0.0–14.0)
NEUT#: 2.6 10*3/uL (ref 1.5–6.5)
NEUT%: 59.8 % (ref 39.0–75.0)
PLATELETS: 33 10*3/uL — AB (ref 140–400)
RBC: 2.9 10*6/uL — ABNORMAL LOW (ref 4.20–5.82)
RDW: 13.8 % (ref 11.0–14.6)
WBC: 4.4 10*3/uL (ref 4.0–10.3)
lymph#: 1.1 10*3/uL (ref 0.9–3.3)

## 2013-12-20 NOTE — Progress Notes (Signed)
No images are attached to the encounter. No scans are attached to the encounter. No scans are attached to the encounter. [name]  Pcp Not In System No address on file  DIAGNOSIS: Lung cancer, primary, with metastasis from lung to other site   Primary site: Lung (Right)   Staging method: AJCC 7th Edition   Clinical: Stage IV (T1b, N2, M1b) signed by Curt Bears, MD on 10/07/2013  9:33 PM   Summary: Stage IV (T1b, N2, M1b)  PRIOR THERAPY:   CURRENT THERAPY: Systemic chemotherapy with carboplatin for an AUC of 5 and Alimta 500 mg per meter squared given every 3 weeks. Her in with cycle 2 the carboplatin is at an AUC of 4 and the Alimta at 375 mg per meter square given every 3 weeks. Status post 3 cycles.  INTERVAL HISTORY: Bryan Rice 53 y.o. male returns for a scheduled regular symptom management visit for followup of his metastatic non small cell lung cancer, adenocarcinoma. He is status post 3 cycles of systemic chemotherapy with  Carboplatin and Alimta. He is tolerating the chemotherapy without difficulty with the exception of neutropenia after cycle #1. He did receive Granix 480 mcg for 4 days. Starting with cycle #2 of his chemotherapy was dose reduced with carboplatin to an AUC of 4 and Alimta at 375 mg per meter square given every 3 weeks. He continues to smoke 6 -10 cigarettes per day, down from 3 packs per day. He reports he is not interested in smoking cessation.he reports that his voice is still hoarse off and on. He denied chest pain, shortness of breath, cough, or hemoptysis. He denied any significant weight loss or night sweats. He presents to proceed with cycle #4. He recently had a restaging CT scan of the chest, abdomen and pelvis and presents to discuss the results.  MEDICAL HISTORY: Past Medical History  Diagnosis Date  . Alcohol abuse   . Tobacco abuse   . Atrial fibrillation     s/p TEE-DCCV 11/12;  amiodarone started  02/03/11  . Chronic systolic heart failure   . Arthritis     left elbow  . Gout   . Cardiomyopathy     In the setting of afib with rvr 11/12;  Echo 01/19/11:  Diffuse HK worse in Inf wall and septum, mod LVE, mild LVH, EF 30%, mild BAE  . DM2 (diabetes mellitus, type 2)     Hemoglobin A1c 7.1 in 11/12/pt.states he's not diabetic  . 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  MEDICATIONS:  Current Outpatient Prescriptions  Medication Sig Dispense Refill  . amiodarone (PACERONE) 200 MG tablet Take 200 mg by mouth daily.      . folic acid (FOLVITE) 1 MG tablet Take 1 tablet (1 mg total) by mouth daily.  30 tablet  3  . acetaminophen (TYLENOL) 325 MG tablet Take 650 mg by mouth every 6 (six) hours as needed.      . bisacodyl (DULCOLAX) 5 MG EC tablet Take 5 mg by mouth daily as needed for moderate constipation.      . cephALEXin (KEFLEX) 500 MG capsule Take 1 capsule (500 mg total) by mouth 3 (three) times daily. 500mg  TID x 5 days  15 capsule  0  . dexamethasone (DECADRON) 4 MG tablet 4 mg po bid the day before, day of and day after chemotherapy  every 3 weeks  40 tablet  1  . potassium chloride SA (K-DUR,KLOR-CON) 20 MEQ tablet Take 1 tablet (20 mEq total) by mouth as directed. Daily x 7 days  7 tablet  0  . prochlorperazine (COMPAZINE) 10 MG tablet Take 1 tablet (10 mg total) by mouth every 6 (six) hours as needed for nausea or vomiting.  30 tablet  0   No current facility-administered medications for this visit.    SURGICAL HISTORY:  Past Surgical History  Procedure Laterality Date  . Cardioversion  01/20/2011    Procedure: CARDIOVERSION;  Surgeon: Fay Records, MD;  Location: [facility];  Service: Cardiovascular;  Laterality: N/A;  . Tee without cardioversion  01/20/2011    Procedure: TRANSESOPHAGEAL ECHOCARDIOGRAM (TEE);  Surgeon: Fay Records, MD;  Location: [facility] ENDOSCOPY;  Service: Cardiovascular;  Laterality: N/A;  .  Cardioversion  01/20/2011    Procedure: CARDIOVERSION;  Surgeon: Fay Records, MD;  Location: [place];  Service: Cardiovascular;  Laterality: N/A;  . Cardioversion  02/27/2011    Procedure: CARDIOVERSION;  Surgeon: Lelon Perla, MD;  Location: [facility] OR;  Service: Cardiovascular;  Laterality: N/A;    REVIEW OF SYSTEMS:  A comprehensive review of systems was negative.   PHYSICAL EXAMINATION: General appearance: alert, cooperative, appears stated age and no distress Head: Normocephalic, without obvious abnormality, atraumatic Neck: no adenopathy, no carotid bruit, no JVD, supple, symmetrical, trachea midline and thyroid not enlarged, symmetric, no tenderness/mass/nodules Lymph nodes: Cervical, supraclavicular, and axillary nodes normal. Resp: clear to auscultation bilaterally Back: symmetric, no curvature. ROM normal. No CVA tenderness. Cardio: regular rate and rhythm, S1, S2 normal, no murmur, click, rub or gallop GI: soft, non-tender; bowel sounds normal; no masses,  no organomegaly Extremities: extremities normal, atraumatic, no cyanosis or edema Neurologic: Alert and oriented X 3, normal strength and tone. Normal symmetric reflexes. Normal coordination and gait  ECOG PERFORMANCE STATUS: 0 - Asymptomatic  Blood pressure 104/72, pulse 94, temperature 97.7 F (36.5 C), temperature source Oral, resp. rate 20, height 5\' 10"  (1.778 m), weight 189 lb 14.4 oz (86.138 kg), SpO2 100.00%.  LABORATORY DATA: Lab Results  Component Value Date   WBC 4.4 12/20/2013   HGB 8.6* 12/20/2013   HCT 26.3* 12/20/2013   MCV 90.6 12/20/2013   PLT 33* 12/20/2013      Chemistry      Component Value Date/Time   NA 138 12/20/2013 1156   NA 135 06/06/2013 1643   K 3.9 12/20/2013 1156   K 4.3 06/06/2013 1643   CL 100 06/06/2013 1643   CO2 25 12/20/2013 1156   CO2 26 06/06/2013 1643   BUN 12.4 12/20/2013 1156   BUN 14 06/06/2013 1643   CREATININE 1.2 12/20/2013 1156   CREATININE 1.1 06/06/2013 1643       Component Value Date/Time   CALCIUM 9.5 12/20/2013 1156   CALCIUM 9.4 06/06/2013 1643   ALKPHOS 109 12/20/2013 1156   ALKPHOS 79 06/06/2013 1643   AST 19 12/20/2013 1156   AST 20 06/06/2013 1643   ALT 16 12/20/2013 1156   ALT 19 06/06/2013 1643   BILITOT 0.44 12/20/2013 1156   BILITOT 0.5 06/06/2013 1643       RADIOGRAPHIC STUDIES:  Ct Chest W Contrast  12/16/2013   CLINICAL DATA:  53 year old male with history of lung cancer diagnosed in 2015 status post radiation therapy now complete. Chemotherapy in progress.  EXAM: CT CHEST, ABDOMEN, AND PELVIS WITH CONTRAST  TECHNIQUE: Multidetector CT imaging of the chest, abdomen  and pelvis was performed following the standard protocol during bolus administration of intravenous contrast.  CONTRAST:  168mL OMNIPAQUE IOHEXOL 300 MG/ML  SOLN  COMPARISON:  CT of the chest, abdomen and pelvis 10/10/2013.  FINDINGS: CT CHEST FINDINGS  Mediastinum: There continues to be multiple borderline enlarged and enlarged mediastinal lymph nodes bilaterally, and right hilar lymph nodes. Specific examples include an unchanged 1.2 cm prevascular lymph node (image 19 of series 2), a slightly decreased left AP window lymph node measuring 14 mm in short axis (image 23 of series 2, previously in 20 mm in short axis), slightly decreased subcarinal lymph node measuring 13 mm in short axis, and right hilar lymph nodes which appear slightly smaller measuring 14 mm in short axis. Abnormal thickening of the right upper lobe bronchus is noted, which could be related to lymphatic infiltration and/or post treatment response. Heart size is normal. There is no significant pericardial fluid, thickening or pericardial calcification. There is atherosclerosis of the thoracic aorta, the great vessels of the mediastinum and the coronary arteries, including calcified atherosclerotic plaque in the left anterior descending and right coronary arteries. Esophagus is unremarkable in appearance.   Lungs/Pleura: Slight decrease in size of right lower lobe pulmonary nodule which currently measures up to 2.3 x 1.7 cm (Image 28 of series 5). Patchy areas of ground-glass attenuation, peribronchovascular septal thickening and architectural distortion in the right mid to upper lung likely reflect post treatment related changes from radiation therapy. This is similar to the prior study. Small pulmonary nodules in the periphery of the left lower lobe measuring up to 4 mm are unchanged. No confluent consolidative airspace disease. No pleural effusions.  Musculoskeletal: Multiple old healed bilateral rib fractures (left greater than right) these. There are no aggressive appearing lytic or blastic lesions noted in the visualized portions of the skeleton.  CT ABDOMEN AND PELVIS FINDINGS  Hepatobiliary: No suspicious appearing hepatic lesions are noted. No intra or extrahepatic biliary ductal dilatation. Gallbladder is unremarkable in appearance.  Pancreas: Unremarkable.  Spleen: Unremarkable.  Adrenals/Urinary Tract: Bilateral adrenal masses appear smaller than the prior examination, currently measuring 3.1 x 1.8 cm on the right and 2.7 x 2.0 cm on the left. Bilateral kidneys are normal in appearance. No hydroureteronephrosis or perinephric stranding to indicate urinary tract obstruction at this time. Urinary bladder is normal in appearance.  Stomach/Bowel: Normal appearance of the stomach. No pathologic dilatation of the small bowel or colon. Normal appendix.  Vascular/Lymphatic: Extensive atherosclerosis throughout the abdominal and pelvic vasculature, without evidence of aneurysm or dissection. Numerous prominent borderline enlarged and mildly enlarged bilateral inguinal lymph nodes are nonspecific, but appear slightly decreased compared to the prior study, with the largest node in the right inguinal region currently measuring 11 mm in short axis (previously 15 mm). Mildly enlarged right external iliac lymph node is  slightly smaller compared to the prior examination measuring only 10 mm on today's study. Multiple borderline enlarged retroperitoneal lymph nodes are similar.  Reproductive: Prostate gland is unremarkable in appearance.  Other: No significant volume of ascites.  No pneumoperitoneum.  Musculoskeletal: There are no aggressive appearing lytic or blastic lesions noted in the visualized portions of the skeleton. 8 mm of anterolisthesis of L5 upon S1. Multilevel degenerative disc disease and facet arthropathy in the lumbar spine. Right-sided L4 pars defect.  IMPRESSION: 1. Today's study demonstrates a positive response to therapy with slight decrease in size of primary lung neoplasm in the superior segment of the right lower lobe, decreasing right hilar and  bilateral mediastinal lymphadenopathy, and decreasing size of bilateral adrenal metastases. There is also decreasing lymphadenopathy in the inguinal regions bilaterally and the right external iliac nodal chain. No new sites of metastatic disease are noted. 2. Additional incidental findings, as above.   Electronically Signed   By: Vinnie Langton M.D.   On: 12/16/2013 17:05   Ct Abdomen Pelvis W Contrast  12/16/2013   CLINICAL DATA:  53 year old male with history of lung cancer diagnosed in 2015 status post radiation therapy now complete. Chemotherapy in progress.  EXAM: CT CHEST, ABDOMEN, AND PELVIS WITH CONTRAST  TECHNIQUE: Multidetector CT imaging of the chest, abdomen and pelvis was performed following the standard protocol during bolus administration of intravenous contrast.  CONTRAST:  128mL OMNIPAQUE IOHEXOL 300 MG/ML  SOLN  COMPARISON:  CT of the chest, abdomen and pelvis 10/10/2013.  FINDINGS: CT CHEST FINDINGS  Mediastinum: There continues to be multiple borderline enlarged and enlarged mediastinal lymph nodes bilaterally, and right hilar lymph nodes. Specific examples include an unchanged 1.2 cm prevascular lymph node (image 19 of series 2), a slightly  decreased left AP window lymph node measuring 14 mm in short axis (image 23 of series 2, previously in 20 mm in short axis), slightly decreased subcarinal lymph node measuring 13 mm in short axis, and right hilar lymph nodes which appear slightly smaller measuring 14 mm in short axis. Abnormal thickening of the right upper lobe bronchus is noted, which could be related to lymphatic infiltration and/or post treatment response. Heart size is normal. There is no significant pericardial fluid, thickening or pericardial calcification. There is atherosclerosis of the thoracic aorta, the great vessels of the mediastinum and the coronary arteries, including calcified atherosclerotic plaque in the left anterior descending and right coronary arteries. Esophagus is unremarkable in appearance.  Lungs/Pleura: Slight decrease in size of right lower lobe pulmonary nodule which currently measures up to 2.3 x 1.7 cm (Image 28 of series 5). Patchy areas of ground-glass attenuation, peribronchovascular septal thickening and architectural distortion in the right mid to upper lung likely reflect post treatment related changes from radiation therapy. This is similar to the prior study. Small pulmonary nodules in the periphery of the left lower lobe measuring up to 4 mm are unchanged. No confluent consolidative airspace disease. No pleural effusions.  Musculoskeletal: Multiple old healed bilateral rib fractures (left greater than right) these. There are no aggressive appearing lytic or blastic lesions noted in the visualized portions of the skeleton.  CT ABDOMEN AND PELVIS FINDINGS  Hepatobiliary: No suspicious appearing hepatic lesions are noted. No intra or extrahepatic biliary ductal dilatation. Gallbladder is unremarkable in appearance.  Pancreas: Unremarkable.  Spleen: Unremarkable.  Adrenals/Urinary Tract: Bilateral adrenal masses appear smaller than the prior examination, currently measuring 3.1 x 1.8 cm on the right and 2.7 x 2.0  cm on the left. Bilateral kidneys are normal in appearance. No hydroureteronephrosis or perinephric stranding to indicate urinary tract obstruction at this time. Urinary bladder is normal in appearance.  Stomach/Bowel: Normal appearance of the stomach. No pathologic dilatation of the small bowel or colon. Normal appendix.  Vascular/Lymphatic: Extensive atherosclerosis throughout the abdominal and pelvic vasculature, without evidence of aneurysm or dissection. Numerous prominent borderline enlarged and mildly enlarged bilateral inguinal lymph nodes are nonspecific, but appear slightly decreased compared to the prior study, with the largest node in the right inguinal region currently measuring 11 mm in short axis (previously 15 mm). Mildly enlarged right external iliac lymph node is slightly smaller compared to the prior examination  measuring only 10 mm on today's study. Multiple borderline enlarged retroperitoneal lymph nodes are similar.  Reproductive: Prostate gland is unremarkable in appearance.  Other: No significant volume of ascites.  No pneumoperitoneum.  Musculoskeletal: There are no aggressive appearing lytic or blastic lesions noted in the visualized portions of the skeleton. 8 mm of anterolisthesis of L5 upon S1. Multilevel degenerative disc disease and facet arthropathy in the lumbar spine. Right-sided L4 pars defect.  IMPRESSION: 1. Today's study demonstrates a positive response to therapy with slight decrease in size of primary lung neoplasm in the superior segment of the right lower lobe, decreasing right hilar and bilateral mediastinal lymphadenopathy, and decreasing size of bilateral adrenal metastases. There is also decreasing lymphadenopathy in the inguinal regions bilaterally and the right external iliac nodal chain. No new sites of metastatic disease are noted. 2. Additional incidental findings, as above.   Electronically Signed   By: Vinnie Langton M.D.   On: 12/16/2013 17:05    ASSESSMENT/PLAN: The patient is a pleasant 53 year old Caucasian male with metastatic non small cell lung cancer, adenocarcinoma. He is status post 2 cycles of chemotherapy with carboplatin and alimta. His carboplatin was reduced to an AUC of 4 and Alimta  to 375 mg per meter squared given every 3 weeks starting with cycle #2 with Neulasta support on day 2 for neutrophil support. He is status post 3 cycles. His restaging CT scan revealed some improvement in his disease with no new areas of metastatic disease. His platelet count remains low at 33,000 and cycle #4 will be postponed by 1 week. He is not having any bleeding or bruising. He will continue his weekly labs and return in 4 weeks prior to the start of cycle #5.   All questions were answered. The patient knows to call the clinic with any problems, questions or concerns. We can certainly see the patient much sooner if necessary.  Disclaimer: This note was dictated with voice recognition software. Similar sounding words can inadvertently be transcribed and may be missed upon review. Carlton Adam, PA-C 12/20/2013

## 2013-12-20 NOTE — Telephone Encounter (Signed)
Gave AVS & Cal with d/t for Nov. Also gave Pt prep for CT scan.

## 2013-12-20 NOTE — Telephone Encounter (Signed)
Per staff message and POF I have scheduled appts. Advised scheduler of appts. JMW  

## 2013-12-21 ENCOUNTER — Ambulatory Visit: Payer: BC Managed Care – PPO

## 2013-12-23 NOTE — Patient Instructions (Signed)
Your restaging CT scan revealed some improvement in your disease Your platelet count is too low to proceed with chemotherapy as scheduled today. He will be rescheduled with repeat labs in 1 week Continue weekly labs in the interim and return and 4 weeks again with the symptom management visit prior to the start of cycle #5

## 2013-12-27 ENCOUNTER — Ambulatory Visit: Payer: Self-pay

## 2013-12-27 ENCOUNTER — Other Ambulatory Visit (HOSPITAL_BASED_OUTPATIENT_CLINIC_OR_DEPARTMENT_OTHER): Payer: Self-pay

## 2013-12-27 ENCOUNTER — Telehealth: Payer: Self-pay | Admitting: *Deleted

## 2013-12-27 DIAGNOSIS — C3491 Malignant neoplasm of unspecified part of right bronchus or lung: Secondary | ICD-10-CM

## 2013-12-27 LAB — CBC WITH DIFFERENTIAL/PLATELET
BASO%: 0.3 % (ref 0.0–2.0)
BASOS ABS: 0 10*3/uL (ref 0.0–0.1)
EOS%: 0.3 % (ref 0.0–7.0)
Eosinophils Absolute: 0 10*3/uL (ref 0.0–0.5)
HEMATOCRIT: 23.9 % — AB (ref 38.4–49.9)
HGB: 8 g/dL — ABNORMAL LOW (ref 13.0–17.1)
LYMPH#: 1 10*3/uL (ref 0.9–3.3)
LYMPH%: 26.5 % (ref 14.0–49.0)
MCH: 30.4 pg (ref 27.2–33.4)
MCHC: 33.4 g/dL (ref 32.0–36.0)
MCV: 91 fL (ref 79.3–98.0)
MONO#: 0.7 10*3/uL (ref 0.1–0.9)
MONO%: 19.5 % — ABNORMAL HIGH (ref 0.0–14.0)
NEUT#: 2 10*3/uL (ref 1.5–6.5)
NEUT%: 53.4 % (ref 39.0–75.0)
Platelets: 86 10*3/uL — ABNORMAL LOW (ref 140–400)
RBC: 2.63 10*6/uL — ABNORMAL LOW (ref 4.20–5.82)
RDW: 13.8 % (ref 11.0–14.6)
WBC: 3.7 10*3/uL — AB (ref 4.0–10.3)

## 2013-12-27 LAB — COMPREHENSIVE METABOLIC PANEL (CC13)
ALT: 19 U/L (ref 0–55)
AST: 24 U/L (ref 5–34)
Albumin: 3 g/dL — ABNORMAL LOW (ref 3.5–5.0)
Alkaline Phosphatase: 98 U/L (ref 40–150)
Anion Gap: 9 mEq/L (ref 3–11)
BUN: 11 mg/dL (ref 7.0–26.0)
CALCIUM: 9.6 mg/dL (ref 8.4–10.4)
CHLORIDE: 104 meq/L (ref 98–109)
CO2: 24 meq/L (ref 22–29)
Creatinine: 1.3 mg/dL (ref 0.7–1.3)
Glucose: 125 mg/dl (ref 70–140)
Potassium: 4.1 mEq/L (ref 3.5–5.1)
Sodium: 137 mEq/L (ref 136–145)
Total Bilirubin: 0.43 mg/dL (ref 0.20–1.20)
Total Protein: 6.6 g/dL (ref 6.4–8.3)

## 2013-12-27 NOTE — Patient Instructions (Signed)
No treatment today because platelets are too low. Delayed by 1 week.

## 2013-12-27 NOTE — Progress Notes (Signed)
Treatment delayed by 1 week. Pt escorted to education for ride to pick up.

## 2013-12-27 NOTE — Telephone Encounter (Signed)
Per chemo RN I have moved appts from today to next week

## 2013-12-28 ENCOUNTER — Ambulatory Visit: Payer: Self-pay

## 2014-01-02 ENCOUNTER — Other Ambulatory Visit: Payer: Self-pay | Admitting: Medical Oncology

## 2014-01-02 DIAGNOSIS — C3490 Malignant neoplasm of unspecified part of unspecified bronchus or lung: Secondary | ICD-10-CM

## 2014-01-03 ENCOUNTER — Other Ambulatory Visit: Payer: Self-pay | Admitting: Medical Oncology

## 2014-01-03 ENCOUNTER — Other Ambulatory Visit (HOSPITAL_BASED_OUTPATIENT_CLINIC_OR_DEPARTMENT_OTHER): Payer: Self-pay

## 2014-01-03 ENCOUNTER — Telehealth: Payer: Self-pay | Admitting: *Deleted

## 2014-01-03 ENCOUNTER — Ambulatory Visit (HOSPITAL_COMMUNITY)
Admission: RE | Admit: 2014-01-03 | Discharge: 2014-01-03 | Disposition: A | Discharge status: Home / Self Care | Payer: BLUE CROSS/BLUE SHIELD | Source: Ambulatory Visit | Attending: Internal Medicine | Admitting: Internal Medicine

## 2014-01-03 ENCOUNTER — Ambulatory Visit (HOSPITAL_BASED_OUTPATIENT_CLINIC_OR_DEPARTMENT_OTHER): Payer: Self-pay

## 2014-01-03 VITALS — BP 138/77 | HR 74 | Temp 97.2°F | Resp 18

## 2014-01-03 DIAGNOSIS — C7931 Secondary malignant neoplasm of brain: Secondary | ICD-10-CM

## 2014-01-03 DIAGNOSIS — D6489 Other specified anemias: Secondary | ICD-10-CM

## 2014-01-03 DIAGNOSIS — C7970 Secondary malignant neoplasm of unspecified adrenal gland: Secondary | ICD-10-CM

## 2014-01-03 DIAGNOSIS — D6481 Anemia due to antineoplastic chemotherapy: Secondary | ICD-10-CM | POA: Insufficient documentation

## 2014-01-03 DIAGNOSIS — Z5111 Encounter for antineoplastic chemotherapy: Secondary | ICD-10-CM

## 2014-01-03 DIAGNOSIS — C3491 Malignant neoplasm of unspecified part of right bronchus or lung: Secondary | ICD-10-CM

## 2014-01-03 DIAGNOSIS — C3490 Malignant neoplasm of unspecified part of unspecified bronchus or lung: Secondary | ICD-10-CM

## 2014-01-03 LAB — CBC WITH DIFFERENTIAL/PLATELET
BASO%: 0.3 % (ref 0.0–2.0)
Basophils Absolute: 0 10*3/uL (ref 0.0–0.1)
EOS%: 0.5 % (ref 0.0–7.0)
Eosinophils Absolute: 0 10*3/uL (ref 0.0–0.5)
HCT: 22.1 % — ABNORMAL LOW (ref 38.4–49.9)
HEMOGLOBIN: 7.5 g/dL — AB (ref 13.0–17.1)
LYMPH%: 21.3 % (ref 14.0–49.0)
MCH: 31.5 pg (ref 27.2–33.4)
MCHC: 33.9 g/dL (ref 32.0–36.0)
MCV: 92.9 fL (ref 79.3–98.0)
MONO#: 0.7 10*3/uL (ref 0.1–0.9)
MONO%: 17.8 % — AB (ref 0.0–14.0)
NEUT#: 2.4 10*3/uL (ref 1.5–6.5)
NEUT%: 60.1 % (ref 39.0–75.0)
Platelets: 169 10*3/uL (ref 140–400)
RBC: 2.38 10*6/uL — AB (ref 4.20–5.82)
RDW: 19.2 % — AB (ref 11.0–14.6)
WBC: 4 10*3/uL (ref 4.0–10.3)
lymph#: 0.9 10*3/uL (ref 0.9–3.3)

## 2014-01-03 LAB — COMPREHENSIVE METABOLIC PANEL (CC13)
ALBUMIN: 3 g/dL — AB (ref 3.5–5.0)
ALK PHOS: 97 U/L (ref 40–150)
ALT: 13 U/L (ref 0–55)
AST: 21 U/L (ref 5–34)
Anion Gap: 6 mEq/L (ref 3–11)
BUN: 11.1 mg/dL (ref 7.0–26.0)
CALCIUM: 9.4 mg/dL (ref 8.4–10.4)
CHLORIDE: 106 meq/L (ref 98–109)
CO2: 25 mEq/L (ref 22–29)
Creatinine: 1.2 mg/dL (ref 0.7–1.3)
Glucose: 100 mg/dl (ref 70–140)
POTASSIUM: 4 meq/L (ref 3.5–5.1)
SODIUM: 136 meq/L (ref 136–145)
TOTAL PROTEIN: 6.6 g/dL (ref 6.4–8.3)
Total Bilirubin: 0.53 mg/dL (ref 0.20–1.20)

## 2014-01-03 MED ORDER — SODIUM CHLORIDE 0.9 % IV SOLN
440.0000 mg | Freq: Once | INTRAVENOUS | Status: AC
  Administered 2014-01-03: 440 mg via INTRAVENOUS
  Filled 2014-01-03: qty 44

## 2014-01-03 MED ORDER — SODIUM CHLORIDE 0.9 % IV SOLN
Freq: Once | INTRAVENOUS | Status: AC
  Administered 2014-01-03: 13:00:00 via INTRAVENOUS

## 2014-01-03 MED ORDER — SODIUM CHLORIDE 0.9 % IV SOLN
375.0000 mg/m2 | Freq: Once | INTRAVENOUS | Status: AC
  Administered 2014-01-03: 825 mg via INTRAVENOUS
  Filled 2014-01-03: qty 33

## 2014-01-03 MED ORDER — ONDANSETRON 16 MG/50ML IVPB (CHCC)
16.0000 mg | Freq: Once | INTRAVENOUS | Status: AC
  Administered 2014-01-03: 16 mg via INTRAVENOUS

## 2014-01-03 MED ORDER — SODIUM CHLORIDE 0.9 % IV SOLN
250.0000 mL | Freq: Once | INTRAVENOUS | Status: AC
  Administered 2014-01-03: 250 mL via INTRAVENOUS

## 2014-01-03 MED ORDER — DIPHENHYDRAMINE HCL 25 MG PO CAPS
ORAL_CAPSULE | ORAL | Status: AC
  Filled 2014-01-03: qty 1

## 2014-01-03 MED ORDER — SODIUM CHLORIDE 0.9 % IV SOLN
456.0000 mg | Freq: Once | INTRAVENOUS | Status: DC
  Filled 2014-01-03: qty 46

## 2014-01-03 MED ORDER — DEXAMETHASONE SODIUM PHOSPHATE 20 MG/5ML IJ SOLN
20.0000 mg | Freq: Once | INTRAMUSCULAR | Status: AC
  Administered 2014-01-03: 20 mg via INTRAVENOUS

## 2014-01-03 MED ORDER — CARBOPLATIN CHEMO INTRADERMAL TEST DOSE 100MCG/0.02ML
100.0000 ug | Freq: Once | INTRADERMAL | Status: AC
  Administered 2014-01-03: 100 ug via INTRADERMAL
  Filled 2014-01-03: qty 0.01

## 2014-01-03 MED ORDER — DIPHENHYDRAMINE HCL 25 MG PO CAPS
25.0000 mg | ORAL_CAPSULE | Freq: Once | ORAL | Status: AC
  Administered 2014-01-03: 25 mg via ORAL

## 2014-01-03 MED ORDER — DEXAMETHASONE SODIUM PHOSPHATE 20 MG/5ML IJ SOLN
INTRAMUSCULAR | Status: AC
  Filled 2014-01-03: qty 5

## 2014-01-03 MED ORDER — ONDANSETRON 16 MG/50ML IVPB (CHCC)
INTRAVENOUS | Status: AC
  Filled 2014-01-03: qty 16

## 2014-01-03 MED ORDER — ACETAMINOPHEN 325 MG PO TABS
ORAL_TABLET | ORAL | Status: AC
  Filled 2014-01-03: qty 2

## 2014-01-03 MED ORDER — ACETAMINOPHEN 325 MG PO TABS
650.0000 mg | ORAL_TABLET | Freq: Once | ORAL | Status: AC
  Administered 2014-01-03: 650 mg via ORAL

## 2014-01-03 NOTE — Progress Notes (Signed)
HAR DONE

## 2014-01-03 NOTE — Patient Instructions (Signed)
[place] Discharge Instructions for Patients Receiving Chemotherapy  Today you received the following chemotherapy agents: Alimta, Carboplatin.   To help prevent nausea and vomiting after your treatment, we encourage you to take your nausea medication as prescribed by your physician.  YOU ALSO RECEIVED ONE UNIT OF BLOOD TODAY. PLEASE KEEP YOUR BLUE BRACELET ON FOR YOUR SECOND UNIT OF BLOOD TOMORROW AT 1:45PM.   If you develop nausea and vomiting that is not controlled by your nausea medication, call the clinic.   BELOW ARE SYMPTOMS THAT SHOULD BE REPORTED IMMEDIATELY:  *FEVER GREATER THAN 100.5 F  *CHILLS WITH OR WITHOUT FEVER  NAUSEA AND VOMITING THAT IS NOT CONTROLLED WITH YOUR NAUSEA MEDICATION  *UNUSUAL SHORTNESS OF BREATH  *UNUSUAL BRUISING OR BLEEDING  TENDERNESS IN MOUTH AND THROAT WITH OR WITHOUT PRESENCE OF ULCERS  *URINARY PROBLEMS  *BOWEL PROBLEMS  UNUSUAL RASH Items with * indicate a potential emergency and should be followed up as soon as possible.  Feel free to call the clinic you have any questions or concerns. The clinic phone number is [phone].

## 2014-01-03 NOTE — Telephone Encounter (Signed)
Spoke with pt while in infusion room today.  Pt requested a note from Dr. Julien Nordmann stating that pt needs to be allowed to remain in his wheelchair in the dining room at his assisted living home.  Pt stated he was very unsteady and almost fell last night trying to get out of wheelchair even with assistance from the staff to sit in the dining chair.   Notes written and signed by Dr. Julien Nordmann.  Notes gave to pt in infusion room.

## 2014-01-04 ENCOUNTER — Ambulatory Visit (HOSPITAL_BASED_OUTPATIENT_CLINIC_OR_DEPARTMENT_OTHER): Payer: Self-pay

## 2014-01-04 ENCOUNTER — Other Ambulatory Visit: Payer: Self-pay | Admitting: *Deleted

## 2014-01-04 ENCOUNTER — Encounter: Payer: Self-pay | Admitting: Internal Medicine

## 2014-01-04 ENCOUNTER — Ambulatory Visit: Payer: Self-pay

## 2014-01-04 ENCOUNTER — Other Ambulatory Visit: Payer: Self-pay

## 2014-01-04 VITALS — BP 146/79 | HR 77 | Temp 97.9°F | Resp 18

## 2014-01-04 DIAGNOSIS — D6489 Other specified anemias: Secondary | ICD-10-CM

## 2014-01-04 DIAGNOSIS — C7931 Secondary malignant neoplasm of brain: Secondary | ICD-10-CM

## 2014-01-04 DIAGNOSIS — C3491 Malignant neoplasm of unspecified part of right bronchus or lung: Secondary | ICD-10-CM

## 2014-01-04 DIAGNOSIS — Z5189 Encounter for other specified aftercare: Secondary | ICD-10-CM

## 2014-01-04 DIAGNOSIS — C7970 Secondary malignant neoplasm of unspecified adrenal gland: Secondary | ICD-10-CM

## 2014-01-04 DIAGNOSIS — D6481 Anemia due to antineoplastic chemotherapy: Secondary | ICD-10-CM | POA: Diagnosis not present

## 2014-01-04 LAB — PREPARE RBC (CROSSMATCH)

## 2014-01-04 MED ORDER — PEGFILGRASTIM INJECTION 6 MG/0.6ML
6.0000 mg | Freq: Once | SUBCUTANEOUS | Status: AC
  Administered 2014-01-04: 6 mg via SUBCUTANEOUS
  Filled 2014-01-04: qty 0.6

## 2014-01-04 MED ORDER — ACETAMINOPHEN 325 MG PO TABS
ORAL_TABLET | ORAL | Status: AC
  Filled 2014-01-04: qty 2

## 2014-01-04 MED ORDER — DIPHENHYDRAMINE HCL 25 MG PO CAPS
25.0000 mg | ORAL_CAPSULE | Freq: Once | ORAL | Status: AC
  Administered 2014-01-04: 25 mg via ORAL

## 2014-01-04 MED ORDER — DIPHENHYDRAMINE HCL 25 MG PO CAPS
ORAL_CAPSULE | ORAL | Status: AC
  Filled 2014-01-04: qty 1

## 2014-01-04 MED ORDER — ACETAMINOPHEN 325 MG PO TABS
650.0000 mg | ORAL_TABLET | Freq: Once | ORAL | Status: AC
  Administered 2014-01-04: 650 mg via ORAL

## 2014-01-04 NOTE — Patient Instructions (Signed)

## 2014-01-04 NOTE — Progress Notes (Signed)
Called Bryan Rice about drug replacement; he states that he switched his insurance to [place] and will be insured.

## 2014-01-05 LAB — TYPE AND SCREEN
ABO/RH(D): B POS
Antibody Screen: NEGATIVE
Unit division: 0
Unit division: 0

## 2014-01-09 ENCOUNTER — Other Ambulatory Visit: Payer: Self-pay | Admitting: Medical Oncology

## 2014-01-09 DIAGNOSIS — C3490 Malignant neoplasm of unspecified part of unspecified bronchus or lung: Secondary | ICD-10-CM

## 2014-01-10 ENCOUNTER — Encounter: Payer: Self-pay | Admitting: Physician Assistant

## 2014-01-10 ENCOUNTER — Other Ambulatory Visit: Payer: Self-pay

## 2014-01-10 ENCOUNTER — Ambulatory Visit: Payer: BC Managed Care – PPO

## 2014-01-10 ENCOUNTER — Telehealth: Payer: Self-pay | Admitting: *Deleted

## 2014-01-10 ENCOUNTER — Other Ambulatory Visit (HOSPITAL_BASED_OUTPATIENT_CLINIC_OR_DEPARTMENT_OTHER): Payer: Self-pay

## 2014-01-10 ENCOUNTER — Telehealth: Payer: Self-pay | Admitting: Internal Medicine

## 2014-01-10 ENCOUNTER — Ambulatory Visit (HOSPITAL_BASED_OUTPATIENT_CLINIC_OR_DEPARTMENT_OTHER): Payer: Self-pay | Admitting: Physician Assistant

## 2014-01-10 VITALS — BP 129/82 | HR 82 | Temp 98.4°F | Resp 18 | Ht 70.0 in | Wt 187.1 lb

## 2014-01-10 DIAGNOSIS — C7970 Secondary malignant neoplasm of unspecified adrenal gland: Secondary | ICD-10-CM

## 2014-01-10 DIAGNOSIS — C7931 Secondary malignant neoplasm of brain: Secondary | ICD-10-CM

## 2014-01-10 DIAGNOSIS — C3490 Malignant neoplasm of unspecified part of unspecified bronchus or lung: Secondary | ICD-10-CM

## 2014-01-10 DIAGNOSIS — D709 Neutropenia, unspecified: Secondary | ICD-10-CM

## 2014-01-10 DIAGNOSIS — C3491 Malignant neoplasm of unspecified part of right bronchus or lung: Secondary | ICD-10-CM

## 2014-01-10 DIAGNOSIS — R490 Dysphonia: Secondary | ICD-10-CM

## 2014-01-10 DIAGNOSIS — D61818 Other pancytopenia: Secondary | ICD-10-CM

## 2014-01-10 LAB — CBC WITH DIFFERENTIAL/PLATELET
BASO%: 1.4 % (ref 0.0–2.0)
Basophils Absolute: 0 10*3/uL (ref 0.0–0.1)
EOS ABS: 0 10*3/uL (ref 0.0–0.5)
EOS%: 2.8 % (ref 0.0–7.0)
HCT: 26.3 % — ABNORMAL LOW (ref 38.4–49.9)
HGB: 8.8 g/dL — ABNORMAL LOW (ref 13.0–17.1)
LYMPH%: 68.1 % — AB (ref 14.0–49.0)
MCH: 30.8 pg (ref 27.2–33.4)
MCHC: 33.5 g/dL (ref 32.0–36.0)
MCV: 92 fL (ref 79.3–98.0)
MONO#: 0 10*3/uL — AB (ref 0.1–0.9)
MONO%: 5.6 % (ref 0.0–14.0)
NEUT%: 22.1 % — ABNORMAL LOW (ref 39.0–75.0)
NEUTROS ABS: 0.2 10*3/uL — AB (ref 1.5–6.5)
Platelets: 57 10*3/uL — ABNORMAL LOW (ref 140–400)
RBC: 2.86 10*6/uL — AB (ref 4.20–5.82)
RDW: 18.8 % — ABNORMAL HIGH (ref 11.0–14.6)
WBC: 0.7 10*3/uL — AB (ref 4.0–10.3)
lymph#: 0.5 10*3/uL — ABNORMAL LOW (ref 0.9–3.3)

## 2014-01-10 LAB — COMPREHENSIVE METABOLIC PANEL (CC13)
ALBUMIN: 3.1 g/dL — AB (ref 3.5–5.0)
ALK PHOS: 93 U/L (ref 40–150)
ALT: 25 U/L (ref 0–55)
AST: 23 U/L (ref 5–34)
Anion Gap: 7 mEq/L (ref 3–11)
BILIRUBIN TOTAL: 1.5 mg/dL — AB (ref 0.20–1.20)
BUN: 26.2 mg/dL — ABNORMAL HIGH (ref 7.0–26.0)
CO2: 24 mEq/L (ref 22–29)
Calcium: 9.5 mg/dL (ref 8.4–10.4)
Chloride: 105 mEq/L (ref 98–109)
Creatinine: 1.3 mg/dL (ref 0.7–1.3)
Glucose: 114 mg/dl (ref 70–140)
POTASSIUM: 3.9 meq/L (ref 3.5–5.1)
Sodium: 137 mEq/L (ref 136–145)
TOTAL PROTEIN: 6.7 g/dL (ref 6.4–8.3)

## 2014-01-10 MED ORDER — CIPROFLOXACIN HCL 500 MG PO TABS: 500.0000 mg | ORAL_TABLET | Freq: Two times a day (BID) | ORAL | Status: DC

## 2014-01-10 NOTE — Progress Notes (Addendum)
No images are attached to the encounter. No scans are attached to the encounter. No scans are attached to the encounter. [place] OFFICE VISIT PROGRESS NOTE  Pcp Not In System No address on file  DIAGNOSIS: Lung cancer, primary, with metastasis from lung to other site   Primary site: Lung (Right)   Staging method: AJCC 7th Edition   Clinical: Stage IV (T1b, N2, M1b) signed by Curt Bears, MD on 10/07/2013  9:33 PM   Summary: Stage IV (T1b, N2, M1b)  PRIOR THERAPY:   CURRENT THERAPY: Systemic chemotherapy with carboplatin for an AUC of 5 and Alimta 500 mg per meter squared given every 3 weeks. Starting with cycle 2 the carboplatin is at an AUC of 4 and the Alimta at 375 mg per meter square given every 3 weeks. Status post 3 cycles.  INTERVAL HISTORY: Bryan Rice 53 y.o. male returns for a scheduled regular symptom management visit for followup of his metastatic non small cell lung cancer, adenocarcinoma. He is status post 3 cycles of systemic chemotherapy with  Carboplatin and Alimta. He is tolerating the chemotherapy without difficulty with the exception of neutropenia after cycle #1. He did receive Granix 480 mcg for 4 days. Starting with cycle #2 of his chemotherapy was dose reduced with carboplatin to an AUC of 4 and Alimta at 375 mg per meter square given every 3 weeks. He continues to smoke 6 -10 cigarettes per day, down from 3 packs per day. He reports he is not interested in smoking cessation, he continues to smoke 8-10 cigarettes per day .He reports increased hoarseness of his voice. He denied pain or swelling of his throat. He denied chest pain, shortness of breath, cough, or hemoptysis. He denied any significant weight loss or night sweats. He presents for a symptom management visit.   MEDICAL HISTORY: Past Medical History  Diagnosis Date  . Alcohol abuse   . Tobacco abuse   . Atrial fibrillation     s/p TEE-DCCV 11/12;  amiodarone started 02/03/11  . Chronic  systolic heart failure   . Arthritis     left elbow  . Gout   . Cardiomyopathy     In the setting of afib with rvr 11/12;  Echo 01/19/11:  Diffuse HK worse in Inf wall and septum, mod LVE, mild LVH, EF 30%, mild BAE  . DM2 (diabetes mellitus, type 2)     Hemoglobin A1c 7.1 in 11/12/pt.states he's not diabetic  . 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  MEDICATIONS:  Current Outpatient Prescriptions  Medication Sig Dispense Refill  . amiodarone (PACERONE) 200 MG tablet Take 200 mg by mouth daily.    Marland Kitchen dexamethasone (DECADRON) 4 MG tablet 4 mg po bid the day before, day of and day after chemotherapy every 3 weeks 40 tablet 1  . folic acid (FOLVITE) 1 MG tablet Take 1 tablet (1 mg total) by mouth daily. 30 tablet 3  . potassium chloride SA (K-DUR,KLOR-CON) 20 MEQ tablet Take 1 tablet (20 mEq total) by mouth as directed. Daily x 7 days 7 tablet 0  . prochlorperazine (COMPAZINE) 10 MG tablet Take 1 tablet (10 mg total) by mouth every 6 (six) hours as needed for nausea or vomiting. 30 tablet 0  . acetaminophen (TYLENOL) 325 MG tablet Take 650 mg by mouth every 6 (six) hours as needed.    . bisacodyl (DULCOLAX)  5 MG EC tablet Take 5 mg by mouth daily as needed for moderate constipation.    . cephALEXin (KEFLEX) 500 MG capsule Take 1 capsule (500 mg total) by mouth 3 (three) times daily. 500mg  TID x 5 days 15 capsule 0  . ciprofloxacin (CIPRO) 500 MG tablet Take 1 tablet (500 mg total) by mouth 2 (two) times daily. x 5 days 10 tablet 0   No current facility-administered medications for this visit.    SURGICAL HISTORY:  Past Surgical History  Procedure Laterality Date  . Cardioversion  01/20/2011    Procedure: CARDIOVERSION;  Surgeon: Fay Records, MD;  Location: [facility];  Service: Cardiovascular;  Laterality: N/A;  . Tee without cardioversion  01/20/2011    Procedure: TRANSESOPHAGEAL ECHOCARDIOGRAM (TEE);  Surgeon: Fay Records, MD;  Location: [place];  Service: Cardiovascular;  Laterality: N/A;  . Cardioversion  01/20/2011    Procedure: CARDIOVERSION;  Surgeon: Fay Records, MD;  Location: [place];  Service: Cardiovascular;  Laterality: N/A;  . Cardioversion  02/27/2011    Procedure: CARDIOVERSION;  Surgeon: Lelon Perla, MD;  Location: [place];  Service: Cardiovascular;  Laterality: N/A;    REVIEW OF SYSTEMS:  A comprehensive review of systems was negative except for: Ears, nose, mouth, throat, and face: positive for hoarseness   PHYSICAL EXAMINATION: General appearance: alert, cooperative, appears stated age and no distress Head: Normocephalic, without obvious abnormality, atraumatic Neck: no adenopathy, no carotid bruit, no JVD, supple, symmetrical, trachea midline and thyroid not enlarged, symmetric, no tenderness/mass/nodules Lymph nodes: Cervical, supraclavicular, and axillary nodes normal. Resp: clear to auscultation bilaterally Back: symmetric, no curvature. ROM normal. No CVA tenderness. Cardio: regular rate and rhythm, S1, S2 normal, no murmur, click, rub or gallop GI: soft, non-tender; bowel sounds normal; no masses,  no organomegaly Extremities: extremities normal, atraumatic, no cyanosis or edema Neurologic: Alert and oriented X 3, normal strength and tone. Normal symmetric reflexes. Normal coordination and gait  ECOG PERFORMANCE STATUS: 0 - Asymptomatic  Blood pressure 129/82, pulse 82, temperature 98.4 F (36.9 C), temperature source Oral, resp. rate 18, height 5\' 10"  (1.778 m), weight 187 lb 1.6 oz (84.868 kg), SpO2 97 %.  LABORATORY DATA: Lab Results  Component Value Date   WBC 0.7* 01/10/2014   HGB 8.8* 01/10/2014   HCT 26.3* 01/10/2014   MCV 92.0 01/10/2014   PLT 57* 01/10/2014      Chemistry      Component Value Date/Time   NA 137 01/10/2014 1009   NA 135 06/06/2013 1643   K 3.9 01/10/2014 1009   K 4.3 06/06/2013 1643   CL 100 06/06/2013 1643   CO2 24 01/10/2014  1009   CO2 26 06/06/2013 1643   BUN 26.2* 01/10/2014 1009   BUN 14 06/06/2013 1643   CREATININE 1.3 01/10/2014 1009   CREATININE 1.1 06/06/2013 1643      Component Value Date/Time   CALCIUM 9.5 01/10/2014 1009   CALCIUM 9.4 06/06/2013 1643   ALKPHOS 93 01/10/2014 1009   ALKPHOS 79 06/06/2013 1643   AST 23 01/10/2014 1009   AST 20 06/06/2013 1643   ALT 25 01/10/2014 1009   ALT 19 06/06/2013 1643   BILITOT 1.50* 01/10/2014 1009   BILITOT 0.5 06/06/2013 1643       RADIOGRAPHIC STUDIES:  Ct Chest W Contrast  12/16/2013   CLINICAL DATA:  53 year old male with history of lung cancer diagnosed in 2015 status post radiation therapy now complete. Chemotherapy in progress.  EXAM: CT  CHEST, ABDOMEN, AND PELVIS WITH CONTRAST  TECHNIQUE: Multidetector CT imaging of the chest, abdomen and pelvis was performed following the standard protocol during bolus administration of intravenous contrast.  CONTRAST:  159mL OMNIPAQUE IOHEXOL 300 MG/ML  SOLN  COMPARISON:  CT of the chest, abdomen and pelvis 10/10/2013.  FINDINGS: CT CHEST FINDINGS  Mediastinum: There continues to be multiple borderline enlarged and enlarged mediastinal lymph nodes bilaterally, and right hilar lymph nodes. Specific examples include an unchanged 1.2 cm prevascular lymph node (image 19 of series 2), a slightly decreased left AP window lymph node measuring 14 mm in short axis (image 23 of series 2, previously in 20 mm in short axis), slightly decreased subcarinal lymph node measuring 13 mm in short axis, and right hilar lymph nodes which appear slightly smaller measuring 14 mm in short axis. Abnormal thickening of the right upper lobe bronchus is noted, which could be related to lymphatic infiltration and/or post treatment response. Heart size is normal. There is no significant pericardial fluid, thickening or pericardial calcification. There is atherosclerosis of the thoracic aorta, the great vessels of the mediastinum and the coronary  arteries, including calcified atherosclerotic plaque in the left anterior descending and right coronary arteries. Esophagus is unremarkable in appearance.  Lungs/Pleura: Slight decrease in size of right lower lobe pulmonary nodule which currently measures up to 2.3 x 1.7 cm (Image 28 of series 5). Patchy areas of ground-glass attenuation, peribronchovascular septal thickening and architectural distortion in the right mid to upper lung likely reflect post treatment related changes from radiation therapy. This is similar to the prior study. Small pulmonary nodules in the periphery of the left lower lobe measuring up to 4 mm are unchanged. No confluent consolidative airspace disease. No pleural effusions.  Musculoskeletal: Multiple old healed bilateral rib fractures (left greater than right) these. There are no aggressive appearing lytic or blastic lesions noted in the visualized portions of the skeleton.  CT ABDOMEN AND PELVIS FINDINGS  Hepatobiliary: No suspicious appearing hepatic lesions are noted. No intra or extrahepatic biliary ductal dilatation. Gallbladder is unremarkable in appearance.  Pancreas: Unremarkable.  Spleen: Unremarkable.  Adrenals/Urinary Tract: Bilateral adrenal masses appear smaller than the prior examination, currently measuring 3.1 x 1.8 cm on the right and 2.7 x 2.0 cm on the left. Bilateral kidneys are normal in appearance. No hydroureteronephrosis or perinephric stranding to indicate urinary tract obstruction at this time. Urinary bladder is normal in appearance.  Stomach/Bowel: Normal appearance of the stomach. No pathologic dilatation of the small bowel or colon. Normal appendix.  Vascular/Lymphatic: Extensive atherosclerosis throughout the abdominal and pelvic vasculature, without evidence of aneurysm or dissection. Numerous prominent borderline enlarged and mildly enlarged bilateral inguinal lymph nodes are nonspecific, but appear slightly decreased compared to the prior study, with the  largest node in the right inguinal region currently measuring 11 mm in short axis (previously 15 mm). Mildly enlarged right external iliac lymph node is slightly smaller compared to the prior examination measuring only 10 mm on today's study. Multiple borderline enlarged retroperitoneal lymph nodes are similar.  Reproductive: Prostate gland is unremarkable in appearance.  Other: No significant volume of ascites.  No pneumoperitoneum.  Musculoskeletal: There are no aggressive appearing lytic or blastic lesions noted in the visualized portions of the skeleton. 8 mm of anterolisthesis of L5 upon S1. Multilevel degenerative disc disease and facet arthropathy in the lumbar spine. Right-sided L4 pars defect.  IMPRESSION: 1. Today's study demonstrates a positive response to therapy with slight decrease in size of primary  lung neoplasm in the superior segment of the right lower lobe, decreasing right hilar and bilateral mediastinal lymphadenopathy, and decreasing size of bilateral adrenal metastases. There is also decreasing lymphadenopathy in the inguinal regions bilaterally and the right external iliac nodal chain. No new sites of metastatic disease are noted. 2. Additional incidental findings, as above.   Electronically Signed   By: Vinnie Langton M.D.   On: 12/16/2013 17:05   Ct Abdomen Pelvis W Contrast  12/16/2013   CLINICAL DATA:  53 year old male with history of lung cancer diagnosed in 2015 status post radiation therapy now complete. Chemotherapy in progress.  EXAM: CT CHEST, ABDOMEN, AND PELVIS WITH CONTRAST  TECHNIQUE: Multidetector CT imaging of the chest, abdomen and pelvis was performed following the standard protocol during bolus administration of intravenous contrast.  CONTRAST:  126mL OMNIPAQUE IOHEXOL 300 MG/ML  SOLN  COMPARISON:  CT of the chest, abdomen and pelvis 10/10/2013.  FINDINGS: CT CHEST FINDINGS  Mediastinum: There continues to be multiple borderline enlarged and enlarged mediastinal lymph  nodes bilaterally, and right hilar lymph nodes. Specific examples include an unchanged 1.2 cm prevascular lymph node (image 19 of series 2), a slightly decreased left AP window lymph node measuring 14 mm in short axis (image 23 of series 2, previously in 20 mm in short axis), slightly decreased subcarinal lymph node measuring 13 mm in short axis, and right hilar lymph nodes which appear slightly smaller measuring 14 mm in short axis. Abnormal thickening of the right upper lobe bronchus is noted, which could be related to lymphatic infiltration and/or post treatment response. Heart size is normal. There is no significant pericardial fluid, thickening or pericardial calcification. There is atherosclerosis of the thoracic aorta, the great vessels of the mediastinum and the coronary arteries, including calcified atherosclerotic plaque in the left anterior descending and right coronary arteries. Esophagus is unremarkable in appearance.  Lungs/Pleura: Slight decrease in size of right lower lobe pulmonary nodule which currently measures up to 2.3 x 1.7 cm (Image 28 of series 5). Patchy areas of ground-glass attenuation, peribronchovascular septal thickening and architectural distortion in the right mid to upper lung likely reflect post treatment related changes from radiation therapy. This is similar to the prior study. Small pulmonary nodules in the periphery of the left lower lobe measuring up to 4 mm are unchanged. No confluent consolidative airspace disease. No pleural effusions.  Musculoskeletal: Multiple old healed bilateral rib fractures (left greater than right) these. There are no aggressive appearing lytic or blastic lesions noted in the visualized portions of the skeleton.  CT ABDOMEN AND PELVIS FINDINGS  Hepatobiliary: No suspicious appearing hepatic lesions are noted. No intra or extrahepatic biliary ductal dilatation. Gallbladder is unremarkable in appearance.  Pancreas: Unremarkable.  Spleen: Unremarkable.   Adrenals/Urinary Tract: Bilateral adrenal masses appear smaller than the prior examination, currently measuring 3.1 x 1.8 cm on the right and 2.7 x 2.0 cm on the left. Bilateral kidneys are normal in appearance. No hydroureteronephrosis or perinephric stranding to indicate urinary tract obstruction at this time. Urinary bladder is normal in appearance.  Stomach/Bowel: Normal appearance of the stomach. No pathologic dilatation of the small bowel or colon. Normal appendix.  Vascular/Lymphatic: Extensive atherosclerosis throughout the abdominal and pelvic vasculature, without evidence of aneurysm or dissection. Numerous prominent borderline enlarged and mildly enlarged bilateral inguinal lymph nodes are nonspecific, but appear slightly decreased compared to the prior study, with the largest node in the right inguinal region currently measuring 11 mm in short axis (previously 15 mm).  Mildly enlarged right external iliac lymph node is slightly smaller compared to the prior examination measuring only 10 mm on today's study. Multiple borderline enlarged retroperitoneal lymph nodes are similar.  Reproductive: Prostate gland is unremarkable in appearance.  Other: No significant volume of ascites.  No pneumoperitoneum.  Musculoskeletal: There are no aggressive appearing lytic or blastic lesions noted in the visualized portions of the skeleton. 8 mm of anterolisthesis of L5 upon S1. Multilevel degenerative disc disease and facet arthropathy in the lumbar spine. Right-sided L4 pars defect.  IMPRESSION: 1. Today's study demonstrates a positive response to therapy with slight decrease in size of primary lung neoplasm in the superior segment of the right lower lobe, decreasing right hilar and bilateral mediastinal lymphadenopathy, and decreasing size of bilateral adrenal metastases. There is also decreasing lymphadenopathy in the inguinal regions bilaterally and the right external iliac nodal chain. No new sites of metastatic  disease are noted. 2. Additional incidental findings, as above.   Electronically Signed   By: Vinnie Langton M.D.   On: 12/16/2013 17:05   ASSESSMENT/PLAN: The patient is a pleasant 53 year old Caucasian male with metastatic non small cell lung cancer, adenocarcinoma. He is status post 2 cycles of chemotherapy with carboplatin and alimta. His carboplatin was reduced to an AUC of 4 and Alimta  to 375 mg per meter squared given every 3 weeks starting with cycle #2 with Neulasta support on day 2 for neutrophil support. He is status post 3 cycles. His restaging CT scan revealed some improvement in his disease with no new areas of metastatic disease. His platelet count has improved but remains low at 57,000. He is not having any bleeding or bruising.  The patient was discussed with and also seen by Dr. Julien Nordmann. He is neutropenic today with a total WBC of 0.7 and ANC of 0.2. He received Neulasta on 01/04/2014. He is afebrile.  He will continue his weekly labs and return in 2 weeks prior to the start of cycle #5. He was placed on a five day course of Cipro at 500 mg po bid for empiric coverage secondary to his neutropenia. When he returns in 2 weeks we will discuss treatment options given his persistent recurrent neutropenia, eg. continuing with carboplatin and alimta, alimta only or a chemotherapy break.  All questions were answered. The patient knows to call the clinic with any problems, questions or concerns. We can certainly see the patient much sooner if necessary.  Disclaimer: This note was dictated with voice recognition software. Similar sounding words can inadvertently be transcribed and may be missed upon review. Carlton Adam, PA-C 01/10/2014   ADDENDUM: Hematology/Oncology Attending: I had a face to face encounter with the patient. Recommended his care plan. This is a very pleasant 53 years old white male with metastatic non-small cell lung cancer currently undergoing systemic chemotherapy  with carboplatin and Alimta. The patient has rough time tolerating his treatment with significant pancytopenia. His absolute neutrophil count is very low today. I recommended for him to discontinue this treatment for now. We will see him back for follow-up visit in 2 weeks for reevaluation after recovery of his pancytopenia to discuss other treatment options. For prophylaxis we will start the patient empirically on Cipro 500 mg by mouth twice a day. He was advised to call immediately if he has any concerning symptoms in the interval.  Disclaimer: This note was dictated with voice recognition software. Similar sounding words can inadvertently be transcribed and may be missed upon review.  Eilleen Kempf., MD 01/11/2014

## 2014-01-10 NOTE — Telephone Encounter (Signed)
Faxed rx for Cipro 500mg  to [facility] facility.

## 2014-01-10 NOTE — Telephone Encounter (Signed)
Per staff message and POF I have scheduled appts. Advised scheduler of appts. JMW  

## 2014-01-10 NOTE — Telephone Encounter (Signed)
Pt confirmed labs/ov per 11/17 POF, gave pt AVS..... KJ, sent msg to add chemo and r/s

## 2014-01-11 ENCOUNTER — Telehealth: Payer: Self-pay | Admitting: Hematology and Oncology

## 2014-01-11 ENCOUNTER — Other Ambulatory Visit: Payer: Self-pay | Admitting: Hematology and Oncology

## 2014-01-11 ENCOUNTER — Ambulatory Visit: Payer: BC Managed Care – PPO

## 2014-01-11 DIAGNOSIS — C3490 Malignant neoplasm of unspecified part of unspecified bronchus or lung: Secondary | ICD-10-CM

## 2014-01-11 MED ORDER — CEPHALEXIN 500 MG PO CAPS: 500.0000 mg | ORAL_CAPSULE | Freq: Three times a day (TID) | ORAL | Status: DC

## 2014-01-11 NOTE — Telephone Encounter (Signed)
I received a telephone call. Prescriptions ciprofloxacin that was prescribed but was noted will interact with amiodarone. I discussed the case with Dr. Julien Nordmann who recommended changing to Keflex. Prescription is faxed.

## 2014-01-11 NOTE — Patient Instructions (Signed)
Continue labs and chemotherapy as scheduled Follow up in 2 weeks

## 2014-01-17 ENCOUNTER — Other Ambulatory Visit (HOSPITAL_BASED_OUTPATIENT_CLINIC_OR_DEPARTMENT_OTHER): Payer: Self-pay

## 2014-01-17 ENCOUNTER — Other Ambulatory Visit: Payer: BC Managed Care – PPO

## 2014-01-17 ENCOUNTER — Ambulatory Visit: Payer: Self-pay

## 2014-01-17 DIAGNOSIS — C3490 Malignant neoplasm of unspecified part of unspecified bronchus or lung: Secondary | ICD-10-CM

## 2014-01-17 DIAGNOSIS — C3491 Malignant neoplasm of unspecified part of right bronchus or lung: Secondary | ICD-10-CM

## 2014-01-17 LAB — CBC WITH DIFFERENTIAL/PLATELET
BASO%: 0.2 % (ref 0.0–2.0)
Basophils Absolute: 0 10*3/uL (ref 0.0–0.1)
EOS ABS: 0 10*3/uL (ref 0.0–0.5)
EOS%: 0.7 % (ref 0.0–7.0)
HCT: 22.1 % — ABNORMAL LOW (ref 38.4–49.9)
HGB: 7.4 g/dL — ABNORMAL LOW (ref 13.0–17.1)
LYMPH#: 0.7 10*3/uL — AB (ref 0.9–3.3)
LYMPH%: 23.3 % (ref 14.0–49.0)
MCH: 30.6 pg (ref 27.2–33.4)
MCHC: 33.3 g/dL (ref 32.0–36.0)
MCV: 91.9 fL (ref 79.3–98.0)
MONO#: 0.3 10*3/uL (ref 0.1–0.9)
MONO%: 10.6 % (ref 0.0–14.0)
NEUT%: 65.2 % (ref 39.0–75.0)
NEUTROS ABS: 1.9 10*3/uL (ref 1.5–6.5)
PLATELETS: 11 10*3/uL — AB (ref 140–400)
RBC: 2.4 10*6/uL — ABNORMAL LOW (ref 4.20–5.82)
RDW: 19.7 % — ABNORMAL HIGH (ref 11.0–14.6)
WBC: 2.9 10*3/uL — AB (ref 4.0–10.3)

## 2014-01-17 LAB — COMPREHENSIVE METABOLIC PANEL (CC13)
ALT: 22 U/L (ref 0–55)
AST: 26 U/L (ref 5–34)
Albumin: 3.1 g/dL — ABNORMAL LOW (ref 3.5–5.0)
Alkaline Phosphatase: 129 U/L (ref 40–150)
Anion Gap: 11 mEq/L (ref 3–11)
BUN: 18.3 mg/dL (ref 7.0–26.0)
CALCIUM: 9.7 mg/dL (ref 8.4–10.4)
CHLORIDE: 102 meq/L (ref 98–109)
CO2: 24 mEq/L (ref 22–29)
Creatinine: 1.3 mg/dL (ref 0.7–1.3)
GLUCOSE: 104 mg/dL (ref 70–140)
Potassium: 3.7 mEq/L (ref 3.5–5.1)
Sodium: 136 mEq/L (ref 136–145)
TOTAL PROTEIN: 7 g/dL (ref 6.4–8.3)
Total Bilirubin: 0.56 mg/dL (ref 0.20–1.20)

## 2014-01-24 ENCOUNTER — Ambulatory Visit: Payer: BC Managed Care – PPO

## 2014-01-24 ENCOUNTER — Encounter: Payer: Self-pay | Admitting: Internal Medicine

## 2014-01-24 ENCOUNTER — Telehealth: Payer: Self-pay | Admitting: *Deleted

## 2014-01-24 ENCOUNTER — Other Ambulatory Visit: Payer: Self-pay | Admitting: *Deleted

## 2014-01-24 ENCOUNTER — Other Ambulatory Visit (HOSPITAL_BASED_OUTPATIENT_CLINIC_OR_DEPARTMENT_OTHER): Payer: BC Managed Care – PPO

## 2014-01-24 ENCOUNTER — Ambulatory Visit (HOSPITAL_COMMUNITY)
Admission: RE | Admit: 2014-01-24 | Discharge: 2014-01-24 | Disposition: A | Discharge status: Home / Self Care | Payer: BLUE CROSS/BLUE SHIELD | Source: Ambulatory Visit | Attending: Internal Medicine | Admitting: Internal Medicine

## 2014-01-24 ENCOUNTER — Ambulatory Visit (HOSPITAL_BASED_OUTPATIENT_CLINIC_OR_DEPARTMENT_OTHER): Payer: BC Managed Care – PPO | Admitting: Internal Medicine

## 2014-01-24 VITALS — BP 136/76 | HR 76 | Temp 97.2°F | Resp 18

## 2014-01-24 VITALS — BP 118/63 | HR 92 | Temp 97.8°F | Resp 18 | Ht 70.0 in | Wt 182.9 lb

## 2014-01-24 DIAGNOSIS — C3490 Malignant neoplasm of unspecified part of unspecified bronchus or lung: Secondary | ICD-10-CM | POA: Diagnosis not present

## 2014-01-24 DIAGNOSIS — T451X5A Adverse effect of antineoplastic and immunosuppressive drugs, initial encounter: Secondary | ICD-10-CM | POA: Diagnosis not present

## 2014-01-24 DIAGNOSIS — C7970 Secondary malignant neoplasm of unspecified adrenal gland: Secondary | ICD-10-CM

## 2014-01-24 DIAGNOSIS — D6481 Anemia due to antineoplastic chemotherapy: Secondary | ICD-10-CM

## 2014-01-24 DIAGNOSIS — C3491 Malignant neoplasm of unspecified part of right bronchus or lung: Secondary | ICD-10-CM

## 2014-01-24 DIAGNOSIS — D696 Thrombocytopenia, unspecified: Secondary | ICD-10-CM

## 2014-01-24 DIAGNOSIS — C7931 Secondary malignant neoplasm of brain: Secondary | ICD-10-CM

## 2014-01-24 LAB — CBC WITH DIFFERENTIAL/PLATELET
BASO%: 0 % (ref 0.0–2.0)
Basophils Absolute: 0 10*3/uL (ref 0.0–0.1)
EOS%: 0.5 % (ref 0.0–7.0)
Eosinophils Absolute: 0 10*3/uL (ref 0.0–0.5)
HCT: 21.8 % — ABNORMAL LOW (ref 38.4–49.9)
HGB: 7.3 g/dL — ABNORMAL LOW (ref 13.0–17.1)
LYMPH#: 1.1 10*3/uL (ref 0.9–3.3)
LYMPH%: 27.8 % (ref 14.0–49.0)
MCH: 30.3 pg (ref 27.2–33.4)
MCHC: 33.5 g/dL (ref 32.0–36.0)
MCV: 90.5 fL (ref 79.3–98.0)
MONO#: 0.9 10*3/uL (ref 0.1–0.9)
MONO%: 21.6 % — ABNORMAL HIGH (ref 0.0–14.0)
NEUT#: 2 10*3/uL (ref 1.5–6.5)
NEUT%: 50.1 % (ref 39.0–75.0)
Platelets: 64 10*3/uL — ABNORMAL LOW (ref 140–400)
RBC: 2.41 10*6/uL — AB (ref 4.20–5.82)
RDW: 17 % — AB (ref 11.0–14.6)
WBC: 4.1 10*3/uL (ref 4.0–10.3)

## 2014-01-24 LAB — COMPREHENSIVE METABOLIC PANEL (CC13)
ALT: 20 U/L (ref 0–55)
AST: 26 U/L (ref 5–34)
Albumin: 3 g/dL — ABNORMAL LOW (ref 3.5–5.0)
Alkaline Phosphatase: 135 U/L (ref 40–150)
Anion Gap: 9 mEq/L (ref 3–11)
BUN: 12.2 mg/dL (ref 7.0–26.0)
CALCIUM: 9.5 mg/dL (ref 8.4–10.4)
CHLORIDE: 103 meq/L (ref 98–109)
CO2: 25 mEq/L (ref 22–29)
Creatinine: 1.4 mg/dL — ABNORMAL HIGH (ref 0.7–1.3)
Glucose: 110 mg/dl (ref 70–140)
Potassium: 4.2 mEq/L (ref 3.5–5.1)
SODIUM: 137 meq/L (ref 136–145)
TOTAL PROTEIN: 6.9 g/dL (ref 6.4–8.3)
Total Bilirubin: 0.38 mg/dL (ref 0.20–1.20)

## 2014-01-24 LAB — PREPARE RBC (CROSSMATCH)

## 2014-01-24 MED ORDER — DIPHENHYDRAMINE HCL 25 MG PO CAPS
ORAL_CAPSULE | ORAL | Status: AC
  Filled 2014-01-24: qty 1

## 2014-01-24 MED ORDER — DIPHENHYDRAMINE HCL 25 MG PO CAPS
25.0000 mg | ORAL_CAPSULE | Freq: Once | ORAL | Status: AC
  Administered 2014-01-24: 25 mg via ORAL

## 2014-01-24 MED ORDER — ACETAMINOPHEN 325 MG PO TABS
650.0000 mg | ORAL_TABLET | Freq: Once | ORAL | Status: AC
  Administered 2014-01-24: 650 mg via ORAL

## 2014-01-24 MED ORDER — ACETAMINOPHEN 325 MG PO TABS
ORAL_TABLET | ORAL | Status: AC
  Filled 2014-01-24: qty 2

## 2014-01-24 NOTE — Telephone Encounter (Signed)
Per Dr Vista Mink, tissue block from biopsy that was done at [facility] needs to be sent for PDL1 expression.  Order faxed to [facility] at [phone].

## 2014-01-24 NOTE — Patient Instructions (Signed)

## 2014-01-24 NOTE — Progress Notes (Signed)
[place] [phone]   [phone]  OFFICE PROGRESS NOTE  Pcp Not In System No address on file  DIAGNOSIS: Lung cancer, primary, with metastasis from lung to other site  Primary site: Lung (Right)  Staging method: AJCC 7th Edition  Clinical: Stage IV (T1b, N2, M1b) signed by Curt Bears, MD on 10/07/2013 9:33 PM  Summary: Stage IV (T1b, N2, M1b)  PRIOR THERAPY:   CURRENT THERAPY: Systemic chemotherapy with carboplatin for an AUC of 5 and Alimta 500 mg per meter squared given every 3 weeks. Starting with cycle 2 the carboplatin is at an AUC of 4 and the Alimta at 375 mg per meter square given every 3 weeks. Status post 4 cycles.  INTERVAL HISTORY: Bryan Rice 53 y.o. male returns to the clinic today for follow-up visit. The patient is feeling fine today except for the persistent fatigue secondary to chemotherapy-induced anemia. Has been off treatment for the last few weeks secondary to pancytopenia after the last cycle of his chemotherapy. He denied having any significant bleeding issues. He has no nausea or vomiting, no fever or chills. The patient denied having any significant chest pain but continues to have shortness of breath with exertion with no cough or hemoptysis. He is here today for reevaluation before resuming systemic chemotherapy.  MEDICAL HISTORY: Past Medical History  Diagnosis Date  . Alcohol abuse   . Tobacco abuse   . Atrial fibrillation     s/p TEE-DCCV 11/12;  amiodarone started 02/03/11  . Chronic systolic heart failure   . Arthritis     left elbow  . Gout   . Cardiomyopathy     In the setting of afib with rvr 11/12;  Echo 01/19/11:  Diffuse HK worse in Inf wall and septum, mod LVE, mild LVH, EF 30%, mild BAE  . DM2 (diabetes mellitus, type 2)     Hemoglobin A1c 7.1 in 11/12/pt.states he's not diabetic  . 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  MEDICATIONS:  Current Outpatient Prescriptions  Medication Sig Dispense Refill  . acetaminophen (TYLENOL) 325 MG tablet Take 650 mg by mouth every 6 (six) hours as needed.    . bisacodyl (DULCOLAX) 5 MG EC tablet Take 5 mg by mouth daily as needed for moderate constipation.    . cephALEXin (KEFLEX) 500 MG capsule Take 1 capsule (500 mg total) by mouth 3 (three) times daily. 500mg  TID x 5 days 15 capsule 0  . ciprofloxacin (CIPRO) 500 MG tablet Take 1 tablet (500 mg total) by mouth 2 (two) times daily. x 5 days 10 tablet 0  . dexamethasone (DECADRON) 4 MG tablet 4 mg po bid the day before, day of and day after chemotherapy every 3 weeks 40 tablet 1  . folic acid (FOLVITE) 1 MG tablet Take 1 tablet (1 mg total) by mouth daily. 30 tablet 3  . prochlorperazine (COMPAZINE) 10 MG tablet Take 1 tablet (10 mg total) by mouth every 6 (six) hours as needed for nausea or vomiting. 30 tablet 0  . amiodarone (PACERONE) 200 MG tablet Take 200 mg by mouth daily.     No current facility-administered medications for this visit.    SURGICAL HISTORY:  Past Surgical History  Procedure Laterality Date  . Cardioversion  01/20/2011    Procedure: CARDIOVERSION;  Surgeon: Fay Records, MD;  Location: [place];  Service: Cardiovascular;  Laterality: N/A;  . Tee without cardioversion  01/20/2011    Procedure: TRANSESOPHAGEAL ECHOCARDIOGRAM (TEE);  Surgeon: Fay Records, MD;  Location: [place];  Service: Cardiovascular;  Laterality: N/A;  . Cardioversion  01/20/2011    Procedure: CARDIOVERSION;  Surgeon: Fay Records, MD;  Location: [place];  Service: Cardiovascular;  Laterality: N/A;  . Cardioversion  02/27/2011    Procedure: CARDIOVERSION;  Surgeon: Lelon Perla, MD;  Location: [place];  Service: Cardiovascular;  Laterality: N/A;    REVIEW OF SYSTEMS:  A comprehensive review of systems was negative except for: Constitutional: positive for fatigue Respiratory: positive for dyspnea on exertion     PHYSICAL EXAMINATION: General appearance: alert, cooperative, fatigued and no distress Head: Normocephalic, without obvious abnormality, atraumatic Neck: no adenopathy, no JVD, supple, symmetrical, trachea midline and thyroid not enlarged, symmetric, no tenderness/mass/nodules Lymph nodes: Cervical, supraclavicular, and axillary nodes normal. Resp: clear to auscultation bilaterally Back: symmetric, no curvature. ROM normal. No CVA tenderness. Cardio: regular rate and rhythm, S1, S2 normal, no murmur, click, rub or gallop GI: soft, non-tender; bowel sounds normal; no masses,  no organomegaly Extremities: extremities normal, atraumatic, no cyanosis or edema  ECOG PERFORMANCE STATUS: 2 - Symptomatic, <50% confined to bed  Blood pressure 118/63, pulse 92, temperature 97.8 F (36.6 C), temperature source Oral, resp. rate 18, height 5\' 10"  (1.778 m), weight 182 lb 14.4 oz (82.963 kg).  LABORATORY DATA: Lab Results  Component Value Date   WBC 4.1 01/24/2014   HGB 7.3* 01/24/2014   HCT 21.8* 01/24/2014   MCV 90.5 01/24/2014   PLT 64* 01/24/2014      Chemistry      Component Value Date/Time   NA 137 01/24/2014 1117   NA 135 06/06/2013 1643   K 4.2 01/24/2014 1117   K 4.3 06/06/2013 1643   CL 100 06/06/2013 1643   CO2 25 01/24/2014 1117   CO2 26 06/06/2013 1643   BUN 12.2 01/24/2014 1117   BUN 14 06/06/2013 1643   CREATININE 1.4* 01/24/2014 1117   CREATININE 1.1 06/06/2013 1643      Component Value Date/Time   CALCIUM 9.5 01/24/2014 1117   CALCIUM 9.4 06/06/2013 1643   ALKPHOS 135 01/24/2014 1117   ALKPHOS 79 06/06/2013 1643   AST 26 01/24/2014 1117   AST 20 06/06/2013 1643   ALT 20 01/24/2014 1117   ALT 19 06/06/2013 1643   BILITOT 0.38 01/24/2014 1117   BILITOT 0.5 06/06/2013 1643       RADIOGRAPHIC STUDIES: No results found.  ASSESSMENT AND PLAN: This is a very pleasant 54 years old white male with stage IV non-small cell lung cancer, adenocarcinoma currently  undergoing systemic chemotherapy with reduced dose carboplatin and Alimta. He is tolerating the treatment well except for the pancytopenia after his treatment. He continues to have persistent chemotherapy-induced anemia as well as thrombocytopenia. I recommended for the patient to discontinue his current treatment with carboplatin and Alimta at this point. I will arrange for him to have repeat CT scan of the chest, abdomen and pelvis in 2 weeks for reevaluation of his disease. I may consider the patient for treatment with immunotherapy with either [place] or Nivolumab. I will ask the pathology department at [facility] to send his tissue for PDL 1 testing if there is sufficient material for testing. I would see him back for follow-up visit in 2-3 weeks for discussion of his scan results and further recommendation regarding treatment of his condition. For the  chemotherapy-induced anemia, I will arrange for the patient to receive 2 units of PRBCs transfusion over the next few days. He was advised to call immediately if he has any concerning symptoms in the interval. The patient voices understanding of current disease status and treatment options and is in agreement with the current care plan.  All questions were answered. The patient knows to call the clinic with any problems, questions or concerns. We can certainly see the patient much sooner if necessary.  Disclaimer: This note was dictated with voice recognition software. Similar sounding words can inadvertently be transcribed and may not be corrected upon review.

## 2014-01-24 NOTE — Patient Instructions (Signed)
Smoking Cessation, Tips for Success  If you are ready to quit smoking, congratulations! You have chosen to help yourself be healthier. Cigarettes bring nicotine, tar, carbon monoxide, and other irritants into your body. Your lungs, heart, and blood vessels will be able to work better without these poisons. There are many different ways to quit smoking. Nicotine gum, nicotine patches, a nicotine inhaler, or nicotine nasal spray can help with physical craving. Hypnosis, support groups, and medicines help break the habit of smoking.  WHAT THINGS CAN I DO TO MAKE QUITTING EASIER?   Here are some tips to help you quit for good:  · Pick a date when you will quit smoking completely. Tell all of your friends and family about your plan to quit on that date.  · Do not try to slowly cut down on the number of cigarettes you are smoking. Pick a quit date and quit smoking completely starting on that day.  · Throw away all cigarettes.    · Clean and remove all ashtrays from your home, work, and car.  · On a card, write down your reasons for quitting. Carry the card with you and read it when you get the urge to smoke.  · Cleanse your body of nicotine. Drink enough water and fluids to keep your urine clear or pale yellow. Do this after quitting to flush the nicotine from your body.  · Learn to predict your moods. Do not let a bad situation be your excuse to have a cigarette. Some situations in your life might tempt you into wanting a cigarette.  · Never have "just one" cigarette. It leads to wanting another and another. Remind yourself of your decision to quit.  · Change habits associated with smoking. If you smoked while driving or when feeling stressed, try other activities to replace smoking. Stand up when drinking your coffee. Brush your teeth after eating. Sit in a different chair when you read the paper. Avoid alcohol while trying to quit, and try to drink fewer caffeinated beverages. Alcohol and caffeine may urge you to  smoke.  · Avoid foods and drinks that can trigger a desire to smoke, such as sugary or spicy foods and alcohol.  · Ask people who smoke not to smoke around you.  · Have something planned to do right after eating or having a cup of coffee. For example, plan to take a walk or exercise.  · Try a relaxation exercise to calm you down and decrease your stress. Remember, you may be tense and nervous for the first 2 weeks after you quit, but this will pass.  · Find new activities to keep your hands busy. Play with a pen, coin, or rubber band. Doodle or draw things on paper.  · Brush your teeth right after eating. This will help cut down on the craving for the taste of tobacco after meals. You can also try mouthwash.    · Use oral substitutes in place of cigarettes. Try using lemon drops, carrots, cinnamon sticks, or chewing gum. Keep them handy so they are available when you have the urge to smoke.  · When you have the urge to smoke, try deep breathing.  · Designate your home as a nonsmoking area.  · If you are a heavy smoker, ask your health care provider about a prescription for nicotine chewing gum. It can ease your withdrawal from nicotine.  · Reward yourself. Set aside the cigarette money you save and buy yourself something nice.  · Look for   support from others. Join a support group or smoking cessation program. Ask someone at home or at work to help you with your plan to quit smoking.  · Always ask yourself, "Do I need this cigarette or is this just a reflex?" Tell yourself, "Today, I choose not to smoke," or "I do not want to smoke." You are reminding yourself of your decision to quit.  · Do not replace cigarette smoking with electronic cigarettes (commonly called e-cigarettes). The safety of e-cigarettes is unknown, and some may contain harmful chemicals.  · If you relapse, do not give up! Plan ahead and think about what you will do the next time you get the urge to smoke.  HOW WILL I FEEL WHEN I QUIT SMOKING?  You  may have symptoms of withdrawal because your body is used to nicotine (the addictive substance in cigarettes). You may crave cigarettes, be irritable, feel very hungry, cough often, get headaches, or have difficulty concentrating. The withdrawal symptoms are only temporary. They are strongest when you first quit but will go away within 10-14 days. When withdrawal symptoms occur, stay in control. Think about your reasons for quitting. Remind yourself that these are signs that your body is healing and getting used to being without cigarettes. Remember that withdrawal symptoms are easier to treat than the major diseases that smoking can cause.   Even after the withdrawal is over, expect periodic urges to smoke. However, these cravings are generally short lived and will go away whether you smoke or not. Do not smoke!  WHAT RESOURCES ARE AVAILABLE TO HELP ME QUIT SMOKING?  Your health care provider can direct you to community resources or hospitals for support, which may include:  · Group support.  · Education.  · Hypnosis.  · Therapy.  Document Released: 11/09/2003 Document Revised: 06/27/2013 Document Reviewed: 07/29/2012  ExitCare® Patient Information ©2015 [employer]. This information is not intended to replace advice given to you by your health care provider. Make sure you discuss any questions you have with your health care provider.

## 2014-01-25 ENCOUNTER — Other Ambulatory Visit: Payer: Self-pay | Admitting: *Deleted

## 2014-01-25 ENCOUNTER — Ambulatory Visit: Payer: Self-pay

## 2014-01-25 DIAGNOSIS — T451X5A Adverse effect of antineoplastic and immunosuppressive drugs, initial encounter: Principal | ICD-10-CM

## 2014-01-25 DIAGNOSIS — D6481 Anemia due to antineoplastic chemotherapy: Secondary | ICD-10-CM

## 2014-01-25 NOTE — Telephone Encounter (Signed)
Bryan Rice called wanting to know what type of PDL1 testing.  Per Dr Vista Mink, it is the [employer] PDL1 CC23 testing for [place].  She verbalized understanding.

## 2014-01-25 NOTE — Telephone Encounter (Signed)
Received call from [place] at [facility] asking which diagnostic service they need to send the tissue for PDL1 testing.  Per Dr Vista Mink, [place] [phone]

## 2014-01-26 ENCOUNTER — Telehealth: Payer: Self-pay | Admitting: *Deleted

## 2014-01-26 ENCOUNTER — Ambulatory Visit (HOSPITAL_BASED_OUTPATIENT_CLINIC_OR_DEPARTMENT_OTHER): Payer: BC Managed Care – PPO

## 2014-01-26 VITALS — BP 131/78 | HR 77 | Temp 97.3°F | Resp 18

## 2014-01-26 DIAGNOSIS — D6481 Anemia due to antineoplastic chemotherapy: Secondary | ICD-10-CM | POA: Diagnosis not present

## 2014-01-26 DIAGNOSIS — T451X5A Adverse effect of antineoplastic and immunosuppressive drugs, initial encounter: Secondary | ICD-10-CM

## 2014-01-26 DIAGNOSIS — D649 Anemia, unspecified: Secondary | ICD-10-CM

## 2014-01-26 LAB — PREPARE RBC (CROSSMATCH)

## 2014-01-26 MED ORDER — DIPHENHYDRAMINE HCL 25 MG PO CAPS
25.0000 mg | ORAL_CAPSULE | Freq: Once | ORAL | Status: AC
  Administered 2014-01-26: 25 mg via ORAL

## 2014-01-26 MED ORDER — SODIUM CHLORIDE 0.9 % IV SOLN
250.0000 mL | Freq: Once | INTRAVENOUS | Status: AC
  Administered 2014-01-26: 250 mL via INTRAVENOUS

## 2014-01-26 MED ORDER — ACETAMINOPHEN 325 MG PO TABS
650.0000 mg | ORAL_TABLET | Freq: Once | ORAL | Status: AC
  Administered 2014-01-26: 650 mg via ORAL

## 2014-01-26 MED ORDER — ACETAMINOPHEN 325 MG PO TABS
ORAL_TABLET | ORAL | Status: AC
  Filled 2014-01-26: qty 2

## 2014-01-26 MED ORDER — DIPHENHYDRAMINE HCL 25 MG PO CAPS
ORAL_CAPSULE | ORAL | Status: AC
  Filled 2014-01-26: qty 1

## 2014-01-26 NOTE — Patient Instructions (Signed)

## 2014-01-26 NOTE — Telephone Encounter (Signed)
Called and informed patient the kidney function is slightly elevated and to push po fluids.  Per Awilda Metro, PA.  Patient verbalized understanding.

## 2014-01-26 NOTE — Telephone Encounter (Signed)
-----   Message from Carlton Adam, PA-C sent at 01/26/2014  9:19 AM EST ----- Abnormal results, please call and notify patient to push po fluids. Kidney function is slightly elevated

## 2014-01-28 LAB — TYPE AND SCREEN
ABO/RH(D): B POS
ANTIBODY SCREEN: NEGATIVE
UNIT DIVISION: 0
Unit division: 0
Unit division: 0

## 2014-01-30 ENCOUNTER — Telehealth: Payer: Self-pay | Admitting: Medical Oncology

## 2014-01-30 NOTE — Telephone Encounter (Signed)
Returned [employer] and line busy.

## 2014-02-03 ENCOUNTER — Ambulatory Visit
Admission: RE | Admit: 2014-02-03 | Discharge: 2014-02-03 | Disposition: A | Discharge status: Home / Self Care | Payer: BC Managed Care – PPO | Source: Ambulatory Visit | Attending: Radiation Oncology | Admitting: Radiation Oncology

## 2014-02-03 DIAGNOSIS — C7931 Secondary malignant neoplasm of brain: Secondary | ICD-10-CM

## 2014-02-03 DIAGNOSIS — C7949 Secondary malignant neoplasm of other parts of nervous system: Principal | ICD-10-CM

## 2014-02-03 MED ORDER — GADOBENATE DIMEGLUMINE 529 MG/ML IV SOLN
17.0000 mL | Freq: Once | INTRAVENOUS | Status: AC | PRN
  Administered 2014-02-03: 17 mL via INTRAVENOUS

## 2014-02-05 ENCOUNTER — Other Ambulatory Visit: Payer: Self-pay | Admitting: Internal Medicine

## 2014-02-05 DIAGNOSIS — C7931 Secondary malignant neoplasm of brain: Secondary | ICD-10-CM

## 2014-02-05 NOTE — Progress Notes (Signed)
Radiation Oncology         [phone]   Name: Bryan Rice   Date: 02/06/2014   MRN: [id number]  DOB: July 31, 1960    Multidisciplinary Brain and Spine Oncology Clinic Follow-Up Visit Note  CC: Pcp Not In System  No ref. provider found    ICD-9-CM ICD-10-CM   1. Brain metastases 198.3 C79.31     Diagnosis:   53 year old gentleman with numerous brain metastases from adenocarcinoma lung status post whole brain radiotherapy at [facility]  Interval Since Last Radiation:  8  months  Narrative:  The patient returns today for routine follow-up.  The recent films were presented in our multidisciplinary conference with neuroradiology just prior to the clinic.  Patient resides at [facility]. Patient reports that he has been hoarse for a month. CT of chest scheduled for tomorrow. Denies pain. Denies appetite. Patient has 100 lb over the last year. Reports dry mouth. No thrush noted. Reports intermittent right frontal minor headaches. Reports occasional dizziness. SOB noted. Reports chemotherapy was stopped 4 weeks ago because of continued low blood counts. Denies diplopia. Unable to ambulate due to weakness. Reports a productive cough but, uncertain of the color of sputum. Denies difficulty swallowing. Reports that he continues to smoke daily                              ALLERGIES:  has No Known Allergies.  Meds: Current Outpatient Prescriptions  Medication Sig Dispense Refill  . amiodarone (PACERONE) 200 MG tablet Take 200 mg by mouth daily.    Marland Kitchen acetaminophen (TYLENOL) 325 MG tablet Take 650 mg by mouth every 6 (six) hours as needed.    . bisacodyl (DULCOLAX) 5 MG EC tablet Take 5 mg by mouth daily as needed for moderate constipation.    . cephALEXin (KEFLEX) 500 MG capsule Take 1 capsule (500 mg total) by mouth 3 (three) times daily. 500mg  TID x 5 days (Patient not taking: Reported on 02/06/2014) 15 capsule 0  . ciprofloxacin (CIPRO) 500 MG tablet Take 1 tablet (500 mg total) by mouth 2 (two)  times daily. x 5 days (Patient not taking: Reported on 02/06/2014) 10 tablet 0  . dexamethasone (DECADRON) 4 MG tablet 4 mg po bid the day before, day of and day after chemotherapy every 3 weeks (Patient not taking: Reported on 02/06/2014) 40 tablet 1  . folic acid (FOLVITE) 1 MG tablet Take 1 tablet (1 mg total) by mouth daily. (Patient not taking: Reported on 02/06/2014) 30 tablet 3  . prochlorperazine (COMPAZINE) 10 MG tablet Take 1 tablet (10 mg total) by mouth every 6 (six) hours as needed for nausea or vomiting. (Patient not taking: Reported on 02/06/2014) 30 tablet 0   No current facility-administered medications for this encounter.    Physical Findings: The patient is in no acute distress. Patient is alert and oriented.  height is 5\' 11"  (1.803 m) and weight is 183 lb (83.008 kg). His blood pressure is 111/75 and his pulse is 92. His respiration is 18 and oxygen saturation is 100%. .  No significant changes.  Lab Findings: Lab Results  Component Value Date   WBC 5.0 02/06/2014   HGB 9.6* 02/06/2014   HCT 28.8* 02/06/2014   MCV 92.3 02/06/2014   PLT 164 02/06/2014   Radiographic Findings: Mr Jeri Cos WN Contrast  02/03/2014   CLINICAL DATA:  SRS examination. Lung cancer, intracranial metastases. Status post whole-brain radiation completed February 2015.  EXAM: MRI HEAD WITHOUT AND WITH CONTRAST  TECHNIQUE: Multiplanar, multiecho pulse sequences of the brain and surrounding structures were obtained without and with intravenous contrast.  CONTRAST:  91mL MULTIHANCE GADOBENATE DIMEGLUMINE 529 MG/ML IV SOLN  COMPARISON:  11/15/2013 Most recent.  FINDINGS: There is significant worsening of multiple intracranial metastases. On the previous study, these were height of last at at least 37 intracranial lesions along with leptomeningeal disease. Worsening is most notable in the posterior fossa metastases, where an index lesion in the midline inferior vermis measures 12 x 10 x 7 mm, as compared  with 8 x 7 x 3 previously. There are no lesions which have improved or resolved, but most have enlarged. A few of the lesions now display developing vasogenic edema, most notable in the RIGHT frontal subcortical white matter, and BILATERAL cerebellum.  Redemonstrated are small foci of microhemorrhage associated with some of the lesions, possible post treatment effect. No other significant findings.  IMPRESSION: Significant worsening of most, if not all multiple intracranial metastases.  Some lesions are now developing vasogenic edema.   Electronically Signed   By: Rolla Flatten M.D.   On: 02/03/2014 11:11    Impression:  The patient has numerous progressive brain mets 5 months s/p whole brain radiotherapy.  We discussed goals of care today, and he is interested in quality of life rather than quantity.  He would not be eligible for SRS, and is not interested in re-irradiation of the whole brain.  He is willing to consider Nivolumab or other immunotherapy which does have activity against brain mets.  Regardless, he is not interested in brain radiotherapy.  Plan:  Patient to meet with palliative care today.  He should follow-up in rad-onc on a prn basis.  Only consider brain MRI if symptoms and patient interested in treatment of the brain.  At present, he does not have desire to continue brain imaging.  _____________________________________  Sheral Apley. Tammi Klippel, M.D.

## 2014-02-06 ENCOUNTER — Ambulatory Visit
Admission: RE | Admit: 2014-02-06 | Discharge: 2014-02-06 | Disposition: A | Discharge status: Home / Self Care | Payer: BC Managed Care – PPO | Source: Ambulatory Visit | Attending: Radiation Oncology | Admitting: Radiation Oncology

## 2014-02-06 ENCOUNTER — Other Ambulatory Visit (HOSPITAL_BASED_OUTPATIENT_CLINIC_OR_DEPARTMENT_OTHER): Payer: BC Managed Care – PPO

## 2014-02-06 ENCOUNTER — Encounter: Payer: Self-pay | Admitting: Radiation Oncology

## 2014-02-06 ENCOUNTER — Ambulatory Visit
Admission: RE | Admit: 2014-02-06 | Discharge: 2014-02-06 | Disposition: A | Discharge status: Home / Self Care | Payer: BLUE CROSS/BLUE SHIELD | Source: Ambulatory Visit | Attending: Radiation Oncology | Admitting: Radiation Oncology

## 2014-02-06 ENCOUNTER — Other Ambulatory Visit: Payer: Self-pay | Admitting: *Deleted

## 2014-02-06 VITALS — BP 111/75 | HR 92 | Resp 18 | Ht 71.0 in | Wt 183.0 lb

## 2014-02-06 DIAGNOSIS — C3491 Malignant neoplasm of unspecified part of right bronchus or lung: Secondary | ICD-10-CM

## 2014-02-06 DIAGNOSIS — C3490 Malignant neoplasm of unspecified part of unspecified bronchus or lung: Secondary | ICD-10-CM

## 2014-02-06 DIAGNOSIS — Z510 Encounter for antineoplastic radiation therapy: Secondary | ICD-10-CM | POA: Insufficient documentation

## 2014-02-06 DIAGNOSIS — Z9221 Personal history of antineoplastic chemotherapy: Secondary | ICD-10-CM | POA: Insufficient documentation

## 2014-02-06 DIAGNOSIS — C7931 Secondary malignant neoplasm of brain: Secondary | ICD-10-CM | POA: Insufficient documentation

## 2014-02-06 DIAGNOSIS — C7970 Secondary malignant neoplasm of unspecified adrenal gland: Secondary | ICD-10-CM

## 2014-02-06 DIAGNOSIS — F17200 Nicotine dependence, unspecified, uncomplicated: Secondary | ICD-10-CM | POA: Insufficient documentation

## 2014-02-06 DIAGNOSIS — C3411 Malignant neoplasm of upper lobe, right bronchus or lung: Secondary | ICD-10-CM | POA: Insufficient documentation

## 2014-02-06 LAB — COMPREHENSIVE METABOLIC PANEL (CC13)
ALBUMIN: 3.1 g/dL — AB (ref 3.5–5.0)
ALT: 21 U/L (ref 0–55)
AST: 34 U/L (ref 5–34)
Alkaline Phosphatase: 112 U/L (ref 40–150)
Anion Gap: 10 mEq/L (ref 3–11)
BUN: 10.3 mg/dL (ref 7.0–26.0)
CALCIUM: 9.5 mg/dL (ref 8.4–10.4)
CHLORIDE: 100 meq/L (ref 98–109)
CO2: 25 mEq/L (ref 22–29)
Creatinine: 1.3 mg/dL (ref 0.7–1.3)
EGFR: 60 mL/min/{1.73_m2} — ABNORMAL LOW (ref >90)
Glucose: 132 mg/dl (ref 70–140)
POTASSIUM: 3.9 meq/L (ref 3.5–5.1)
SODIUM: 135 meq/L — AB (ref 136–145)
TOTAL PROTEIN: 6.8 g/dL (ref 6.4–8.3)
Total Bilirubin: 0.47 mg/dL (ref 0.20–1.20)

## 2014-02-06 LAB — CBC WITH DIFFERENTIAL/PLATELET
BASO%: 0.2 % (ref 0.0–2.0)
BASOS ABS: 0 10*3/uL (ref 0.0–0.1)
EOS%: 1.2 % (ref 0.0–7.0)
Eosinophils Absolute: 0.1 10*3/uL (ref 0.0–0.5)
HCT: 28.8 % — ABNORMAL LOW (ref 38.4–49.9)
HEMOGLOBIN: 9.6 g/dL — AB (ref 13.0–17.1)
LYMPH#: 1.2 10*3/uL (ref 0.9–3.3)
LYMPH%: 24.8 % (ref 14.0–49.0)
MCH: 30.8 pg (ref 27.2–33.4)
MCHC: 33.3 g/dL (ref 32.0–36.0)
MCV: 92.3 fL (ref 79.3–98.0)
MONO#: 1.1 10*3/uL — ABNORMAL HIGH (ref 0.1–0.9)
MONO%: 22.8 % — AB (ref 0.0–14.0)
NEUT#: 2.5 10*3/uL (ref 1.5–6.5)
NEUT%: 51 % (ref 39.0–75.0)
Platelets: 164 10*3/uL (ref 140–400)
RBC: 3.12 10*6/uL — AB (ref 4.20–5.82)
RDW: 19.8 % — AB (ref 11.0–14.6)
WBC: 5 10*3/uL (ref 4.0–10.3)

## 2014-02-06 LAB — HOLD TUBE, BLOOD BANK

## 2014-02-06 NOTE — Consult Note (Signed)
Patient XF:GHWEX Bryan Rice      DOB: [birth year]      HBZ:[id number]     Consult Note from the Palliative Medicine Team at [facility] Requested by:     PCP: Pcp Not In System Reason for Consultation              Phone                                                                                                                                Number:None  Assessment of patients Current state:   Continued physical and functional decline 2/2 to living with the serious illness of stage IV non-small cell lung cancer and associated treatments.  Consult is for introduction to the concept of Palliative Medicine and its role in patient centered care,  clarification of Advanced Directives,  holistic support and symptom management as indicated  This NP Wadie Lessen reviewed medical records, received report from team, assessed the patient and then meet with Bryan Rice "Jeralyn Ruths" in the outpatient radiation-oncology clinic.   A detailed discussion was had today regarding advanced directives.  Concepts specific to code status, artifical feeding and hydration,  was had.  The difference between a aggressive medical intervention path  and a palliative comfort care path was had.  Values and goals of care important to patient and family were attempted to be elicited.  Stressed the importance of documentation of HPOA and Living Will and continued conversation with his support persons in order to enhance his goals of care now and in the future  Concept of  Palliative Care was discussed  Questions and concerns addressed.   Patient was  encouraged to call with questions or concerns.  PMT will continue to support holistically as indicated    Goals of Care: 1.  Code Status:  DNR/DNI   2. Scope of Treatment:  At this time patient  is open to all available and offered medcial interventions to prolong quality life.  He remains hopeful for the success of his treatment plan.   4. Symptom  Management:   1. 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 Psychosocial:  Emotional support offered to patient.  A long discussion was had regarding finding meaning in each day as it presents.  He verbalizes the difficulty and emotional stress of not only the disease and its treatment but the emotional stress of losing independence and purpose.  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  Encouraged to seek support thru the cancer center/brain tumor support group and continued visits with NP with Palliative Medicine     Patient Documents Completed or Given: Document Given Completed  Advanced Directives Pkt    MOST X   DNR X X  Gone from My Sight    Hard Choices      Brief HPI: DIAGNOSIS: Lung cancer, primary, with metastasis from lung to other site  Primary site: Lung (Right)  Staging method: AJCC 7th Edition  Clinical: Stage IV (T1b, N2, M1b) signed by Curt Bears, MD on 10/07/2013 9:33 PM  Summary: Stage IV (T1b, N2, M1b)  PRIOR THERAPY:   CURRENT THERAPY: Systemic chemotherapy with carboplatin for an AUC of 5 and Alimta 500 mg per meter squared given every 3 weeks. Starting with cycle 2 the carboplatin is at an AUC of 4 and the Alimta at 375 mg per meter square given every 3 weeks. Status post 4 cycles. -whole brain radiation recurrent brain metastasis under  Dr Tammi Klippel   ROS: weakness, hoarseness    PMH:  Past Medical History  Diagnosis Date  . Alcohol abuse   . Tobacco abuse   . Atrial fibrillation     s/p TEE-DCCV 11/12;  amiodarone started 02/03/11  . Chronic systolic heart failure   . Arthritis     left elbow  . Gout   . Cardiomyopathy     In the setting of afib with rvr 11/12;  Echo 01/19/11:  Diffuse HK worse in Inf wall and septum, mod LVE, mild LVH, EF 30%, mild BAE  . DM2 (diabetes mellitus, type 2)     Hemoglobin A1c 7.1 in 11/12/pt.states he's not diabetic  . Lung cancer     non small cell lung cancer with mets to the brain     PSH: Past Surgical History  Procedure Laterality Date  . Cardioversion  01/20/2011    Procedure: CARDIOVERSION;  Surgeon: Fay Records, MD;  Location: [facility];  Service: Cardiovascular;  Laterality: N/A;  . Tee without cardioversion  01/20/2011    Procedure: TRANSESOPHAGEAL ECHOCARDIOGRAM (TEE);  Surgeon: Fay Records, MD;  Location: [facility];  Service: Cardiovascular;  Laterality: N/A;  . Cardioversion  01/20/2011    Procedure: CARDIOVERSION;  Surgeon: Fay Records, MD;  Location: [facility];  Service: Cardiovascular;  Laterality: N/A;  . Cardioversion  02/27/2011    Procedure: CARDIOVERSION;  Surgeon: Lelon Perla, MD;  Location: [facility];  Service: Cardiovascular;  Laterality: N/A;   I have reviewed the [place] and SH and  If appropriate update it with new information. No Known Allergies Scheduled Meds: Continuous Infusions: PRN Meds:.      There were no vitals taken for this visit.    No intake or output data in the 24 hours ending 02/06/14 1532   Physical Exam:  General: chronically ill appearing, NAD, weakens easily on minimal exertions HEENT:  Noted hoarseness in speach Neuro:  Alert and oriented X3  Labs: CBC    Component Value Date/Time   WBC 5.0 02/06/2014 0944   WBC 4.6 06/06/2013 1643   RBC 3.12* 02/06/2014 0944   RBC 3.57* 06/06/2013 1643   HGB 9.6*  02/06/2014 0944   HGB 12.2* 06/06/2013 1643   HCT 28.8* 02/06/2014 0944   HCT 35.7* 06/06/2013 1643   PLT 164  02/06/2014 0944   PLT 221.0 06/06/2013 1643   MCV 92.3 02/06/2014 0944   MCV 100.0 06/06/2013 1643   MCH 30.8 02/06/2014 0944   MCH 32.3 02/27/2011 0915   MCHC 33.3 02/06/2014 0944   MCHC 34.2 06/06/2013 1643   RDW 19.8* 02/06/2014 0944   RDW 13.3 06/06/2013 1643   LYMPHSABS 1.2 02/06/2014 0944   LYMPHSABS 1.5 05/05/2012 1632   MONOABS 1.1* 02/06/2014 0944   MONOABS 0.8 05/05/2012 1632   EOSABS 0.1 02/06/2014 0944   EOSABS 0.2 05/05/2012 1632   BASOSABS 0.0 02/06/2014 0944   BASOSABS 0.1 05/05/2012 1632    BMET    Component Value Date/Time   NA 135* 02/06/2014 0947   NA 135 06/06/2013 1643   K 3.9 02/06/2014 0947   K 4.3 06/06/2013 1643   CL 100 06/06/2013 1643   CO2 25 02/06/2014 0947   CO2 26 06/06/2013 1643   GLUCOSE 132 02/06/2014 0947   GLUCOSE 123* 06/06/2013 1643   BUN 10.3 02/06/2014 0947   BUN 14 06/06/2013 1643   CREATININE 1.3 02/06/2014 0947   CREATININE 1.1 06/06/2013 1643   CALCIUM 9.5 02/06/2014 0947   CALCIUM 9.4 06/06/2013 1643   GFRNONAA 85* 02/27/2011 0915   GFRAA >90 02/27/2011 0915    CMP     Component Value Date/Time   NA 135* 02/06/2014 0947   NA 135 06/06/2013 1643   K 3.9 02/06/2014 0947   K 4.3 06/06/2013 1643   CL 100 06/06/2013 1643   CO2 25 02/06/2014 0947   CO2 26 06/06/2013 1643   GLUCOSE 132 02/06/2014 0947   GLUCOSE 123* 06/06/2013 1643   BUN 10.3 02/06/2014 0947   BUN 14 06/06/2013 1643   CREATININE 1.3 02/06/2014 0947   CREATININE 1.1 06/06/2013 1643   CALCIUM 9.5 02/06/2014 0947   CALCIUM 9.4 06/06/2013 1643   PROT 6.8 02/06/2014 0947   PROT 7.2 06/06/2013 1643   ALBUMIN 3.1* 02/06/2014 0947   ALBUMIN 3.6 06/06/2013 1643   AST 34 02/06/2014 0947   AST 20 06/06/2013 1643   ALT 21 02/06/2014 0947   ALT 19 06/06/2013 1643   ALKPHOS 112 02/06/2014 0947   ALKPHOS 79 06/06/2013 1643   BILITOT 0.47  02/06/2014 0947   BILITOT 0.5 06/06/2013 1643   GFRNONAA 85* 02/27/2011 0915   GFRAA >90 02/27/2011 0915   ECOG PERFORMANCE STATUS* (Eastern Cooperative Oncology Group)  0 Fully active, able to continue with all pre-disease activities without restriction. Pt score  1 Restricted in physically strenuous activity but ambulatory and able to carry out work of a light or sedentary nature, e.g., light house work, office work.   2 Ambulatory and capable of all self-care but unable to carry out any work activities. Up and about more than 50% of waking hours.  2  3 Capable of only limited self-care. Confined to bed or chair more than 50% of waking hours.   4 Completely disabled. Cannot carry on any self-care. Totally confined to bed or chair.   5 Dead.    As published in Am. J. Clin. Oncol.: Eustace Pen, M.M., Colon Flattery., Leland, D.C., Horton, Sharen Hint., Drexel Iha, P.P.: Toxicity And Response Criteria Of The [facility]. [place] 5:784-696, 1982.  The ECOG Performance Status is in the public domain therefore available for public use. To duplicate the scale, please cite the reference above and credit the [facility] Group, Tyler Pita M.D., Group Chair    Time In  Time Out Total Time Spent with Patient Total Overall Time  1130 1240 60 min 70 min    Greater than 50%  of this time was spent counseling and coordinating care related to the above assessment and plan.   Wadie Lessen NP  Palliative Medicine Team Team Phone # [phone] Pager [phone]

## 2014-02-06 NOTE — Progress Notes (Signed)
Patient resides at [facility]. Patient reports that he has been hoarse for a month. CT of chest scheduled for tomorrow. Denies pain. Denies appetite. Patient has 100 lb over the last year. Reports dry mouth. No thrush noted. Reports intermittent right frontal minor headaches. Reports occasional dizziness. SOB noted. Reports chemotherapy was stopped 4 weeks ago because of continued low blood counts. Denies diplopia. Unable to ambulate due to weakness. Reports a productive cough but, uncertain of the color of sputum. Denies difficulty swallowing. Reports that he continues to smoke daily.

## 2014-02-07 ENCOUNTER — Ambulatory Visit: Payer: Self-pay

## 2014-02-07 ENCOUNTER — Encounter (HOSPITAL_COMMUNITY): Payer: Self-pay

## 2014-02-07 ENCOUNTER — Ambulatory Visit (HOSPITAL_COMMUNITY)
Admission: RE | Admit: 2014-02-07 | Discharge: 2014-02-07 | Disposition: A | Discharge status: Home / Self Care | Payer: BC Managed Care – PPO | Source: Ambulatory Visit | Attending: Internal Medicine | Admitting: Internal Medicine

## 2014-02-07 DIAGNOSIS — C7971 Secondary malignant neoplasm of right adrenal gland: Secondary | ICD-10-CM | POA: Insufficient documentation

## 2014-02-07 DIAGNOSIS — C7972 Secondary malignant neoplasm of left adrenal gland: Secondary | ICD-10-CM | POA: Insufficient documentation

## 2014-02-07 DIAGNOSIS — Z08 Encounter for follow-up examination after completed treatment for malignant neoplasm: Secondary | ICD-10-CM | POA: Diagnosis present

## 2014-02-07 DIAGNOSIS — C3431 Malignant neoplasm of lower lobe, right bronchus or lung: Secondary | ICD-10-CM | POA: Insufficient documentation

## 2014-02-07 DIAGNOSIS — Z9221 Personal history of antineoplastic chemotherapy: Secondary | ICD-10-CM | POA: Insufficient documentation

## 2014-02-07 DIAGNOSIS — C3491 Malignant neoplasm of unspecified part of right bronchus or lung: Secondary | ICD-10-CM

## 2014-02-07 MED ORDER — IOHEXOL 300 MG/ML  SOLN
100.0000 mL | Freq: Once | INTRAMUSCULAR | Status: AC | PRN
  Administered 2014-02-07: 100 mL via INTRAVENOUS

## 2014-02-09 ENCOUNTER — Telehealth: Payer: Self-pay | Admitting: Internal Medicine

## 2014-02-09 ENCOUNTER — Ambulatory Visit
Admission: RE | Admit: 2014-02-09 | Discharge: 2014-02-09 | Disposition: A | Discharge status: Home / Self Care | Payer: BLUE CROSS/BLUE SHIELD | Source: Ambulatory Visit | Attending: Radiation Oncology | Admitting: Radiation Oncology

## 2014-02-09 ENCOUNTER — Ambulatory Visit (HOSPITAL_BASED_OUTPATIENT_CLINIC_OR_DEPARTMENT_OTHER): Payer: BC Managed Care – PPO | Admitting: Internal Medicine

## 2014-02-09 VITALS — BP 114/71 | HR 97 | Temp 98.2°F | Resp 19 | Ht 71.0 in | Wt 178.2 lb

## 2014-02-09 DIAGNOSIS — C3491 Malignant neoplasm of unspecified part of right bronchus or lung: Secondary | ICD-10-CM

## 2014-02-09 DIAGNOSIS — C3411 Malignant neoplasm of upper lobe, right bronchus or lung: Secondary | ICD-10-CM | POA: Diagnosis not present

## 2014-02-09 DIAGNOSIS — C7931 Secondary malignant neoplasm of brain: Secondary | ICD-10-CM | POA: Diagnosis not present

## 2014-02-09 DIAGNOSIS — Z9221 Personal history of antineoplastic chemotherapy: Secondary | ICD-10-CM | POA: Diagnosis not present

## 2014-02-09 DIAGNOSIS — F17200 Nicotine dependence, unspecified, uncomplicated: Secondary | ICD-10-CM | POA: Diagnosis not present

## 2014-02-09 DIAGNOSIS — D61810 Antineoplastic chemotherapy induced pancytopenia: Secondary | ICD-10-CM

## 2014-02-09 DIAGNOSIS — Z510 Encounter for antineoplastic radiation therapy: Secondary | ICD-10-CM | POA: Diagnosis not present

## 2014-02-09 NOTE — Telephone Encounter (Signed)
Pt confirmed labs/ov per 12/17 POF, gave pt AVS.... KJ

## 2014-02-09 NOTE — Progress Notes (Signed)
  Radiation Oncology         [phone] ________________________________  Name: Bryan Rice MRN: [id number]  Date: 02/09/2014  DOB: 12/04/1960  SIMULATION AND TREATMENT PLANNING NOTE    ICD-9-CM ICD-10-CM   1. Brain metastases 198.3 C79.31     DIAGNOSIS:  53 year old gentleman with recurrent brain metastases  NARRATIVE:  The patient was brought to the [facility].  Identity was confirmed.  All relevant records and images related to the planned course of therapy were reviewed.  The patient freely provided informed written consent to proceed with treatment after reviewing the details related to the planned course of therapy. The consent form was witnessed and verified by the simulation staff.  Then, the patient was set-up in a stable reproducible  supine position for radiation therapy.  CT images were obtained.  Surface markings were placed.  The CT images were loaded into the planning software.  Then the target and avoidance structures were contoured.  Treatment planning then occurred.  The radiation prescription was entered and confirmed.  Then, I designed and supervised the construction of a total of 3 medically necessary complex treatment devices, including a custom made thermoplastic mask used for immobilization and two complex multileaf collimators to cover the entire intracranial contents, while shielding the eyes and face.  Each [facility] is independently created to account for beam divergence.  The right and left lateral fields will be treated with 6 MV X-rays.  I have requested : Isodose Plan.    SPECIAL TREATMENT PROCEDURE:  The planned course of therapy using radiation constitutes a special treatment procedure. Special care is required in the management of this patient. This treatment constitutes a Special Treatment Procedure for the following reason: Retreatment in a previously radiated area requiring careful monitoring of increased risk of toxicity due to overlap of previous  treatment.  He previously received whole brain radiotherapy.  The special nature of the planned course of radiotherapy will require increased physician supervision and oversight to ensure patient's safety with optimal treatment outcomes.  PLAN:  The whole brain will be treated to 25 Gy in 10 fractions.  ________________________________  Sheral Apley Tammi Klippel, M.D.

## 2014-02-09 NOTE — Progress Notes (Signed)
Bryan Rice:(336) [phone]   Fax:(336) [phone]  OFFICE PROGRESS NOTE  Pcp Not In System No address on file  DIAGNOSIS: Lung cancer, primary, with metastasis from lung to other site  Primary site: Lung (Right)  Staging method: AJCC 7th Edition  Clinical: Stage IV (T1b, N2, M1b) signed by Curt Bears, MD on 10/07/2013 9:33 PM  Summary: Stage IV (T1b, N2, M1b)  PRIOR THERAPY:  1) induction chemotherapy with carboplatin and Alimta at [facility] with partial response. 2) whole brain irradiation completed in February 2015 also at [facility]. 3) Systemic chemotherapy with carboplatin for an AUC of 5 and Alimta 500 mg per meter squared given every 3 weeks. Starting with cycle 2 the carboplatin is at an AUC of 4 and the Alimta at 375 mg per meter square given every 3 weeks. Status post 4 cycles. Discontinued secondary to intolerance but the patient had stable disease.  CURRENT THERAPY: None but the patient may be considered for repeat whole brain irradiation under the care of Dr. Tammi Klippel.  INTERVAL HISTORY: Bryan Rice 53 y.o. male returns to the clinic today for follow-up visit. The patient completed 4 cycles of systemic chemotherapy with reduced dose carboplatin and Alimta. He has intolerance to this treatment with significant fatigue and weakness secondary to chemotherapy-induced pancytopenia. He is feeling a little bit better today with no significant fever or chills, no nausea or vomiting. The patient denied having any significant chest pain but continues to have shortness breath with exertion with no cough or hemoptysis. He also has mild headache. Repeat MRI of the brain recently showed evidence for disease progression in the brain. He was seen by Dr. Tammi Klippel but was reluctant about proceeding with any form of radiotherapy to the brain. He also has repeat CT scan of the chest, abdomen and pelvis performed recently and he is here for  evaluation and discussion of his scan results.  MEDICAL HISTORY: Past Medical History  Diagnosis Date  . Alcohol abuse   . Tobacco abuse   . Atrial fibrillation     s/p TEE-DCCV 11/12;  amiodarone started 02/03/11  . Chronic systolic heart failure   . Arthritis     left elbow  . Gout   . Cardiomyopathy     In the setting of afib with rvr 11/12;  Echo 01/19/11:  Diffuse HK worse in Inf wall and septum, mod LVE, mild LVH, EF 30%, mild BAE  . DM2 (diabetes mellitus, type 2)     Hemoglobin A1c 7.1 in 11/12/pt.states he's not diabetic  . 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  MEDICATIONS:  Current Outpatient Prescriptions  Medication Sig Dispense Refill  . acetaminophen (TYLENOL) 325 MG tablet Take 650 mg by mouth every 6 (six) hours as needed.    Marland Kitchen amiodarone (PACERONE) 200 MG tablet Take 200 mg by mouth daily.    . bisacodyl (DULCOLAX) 5 MG EC tablet Take 5 mg by mouth daily as needed for moderate constipation.    . cephALEXin (KEFLEX) 500 MG capsule Take 1 capsule (500 mg total) by mouth 3 (three) times daily. 500mg  TID x 5 days (Patient not taking: Reported on 02/06/2014) 15 capsule 0  . ciprofloxacin (CIPRO) 500 MG tablet Take 1 tablet (500 mg total) by mouth 2 (two) times daily. x 5 days (Patient not taking: Reported on 02/06/2014) 10 tablet  0  . dexamethasone (DECADRON) 4 MG tablet 4 mg po bid the day before, day of and day after chemotherapy every 3 weeks (Patient not taking: Reported on 02/06/2014) 40 tablet 1  . folic acid (FOLVITE) 1 MG tablet Take 1 tablet (1 mg total) by mouth daily. (Patient not taking: Reported on 02/06/2014) 30 tablet 3  . prochlorperazine (COMPAZINE) 10 MG tablet Take 1 tablet (10 mg total) by mouth every 6 (six) hours as needed for nausea or vomiting. (Patient not taking: Reported on 02/06/2014) 30 tablet 0   No current facility-administered medications for this visit.    SURGICAL  HISTORY:  Past Surgical History  Procedure Laterality Date  . Cardioversion  01/20/2011    Procedure: CARDIOVERSION;  Surgeon: Fay Records, MD;  Location: [place];  Service: Cardiovascular;  Laterality: N/A;  . Tee without cardioversion  01/20/2011    Procedure: TRANSESOPHAGEAL ECHOCARDIOGRAM (TEE);  Surgeon: Fay Records, MD;  Location: [facility] ENDOSCOPY;  Service: Cardiovascular;  Laterality: N/A;  . Cardioversion  01/20/2011    Procedure: CARDIOVERSION;  Surgeon: Fay Records, MD;  Location: [facility] ENDOSCOPY;  Service: Cardiovascular;  Laterality: N/A;  . Cardioversion  02/27/2011    Procedure: CARDIOVERSION;  Surgeon: Lelon Perla, MD;  Location: [place];  Service: Cardiovascular;  Laterality: N/A;    REVIEW OF SYSTEMS:  Constitutional: positive for anorexia and fatigue Eyes: negative Ears, nose, mouth, throat, and face: negative Respiratory: positive for dyspnea on exertion Cardiovascular: negative Gastrointestinal: negative Genitourinary:negative Integument/breast: negative Hematologic/lymphatic: negative Musculoskeletal:positive for muscle weakness Neurological: positive for headaches Behavioral/Psych: negative Endocrine: negative Allergic/Immunologic: negative   PHYSICAL EXAMINATION: General appearance: alert, cooperative, fatigued and no distress Head: Normocephalic, without obvious abnormality, atraumatic Neck: no adenopathy, no JVD, supple, symmetrical, trachea midline and thyroid not enlarged, symmetric, no tenderness/mass/nodules Lymph nodes: Cervical, supraclavicular, and axillary nodes normal. Resp: clear to auscultation bilaterally Back: symmetric, no curvature. ROM normal. No CVA tenderness. Cardio: regular rate and rhythm, S1, S2 normal, no murmur, click, rub or gallop GI: soft, non-tender; bowel sounds normal; no masses,  no organomegaly Extremities: extremities normal, atraumatic, no cyanosis or edema Neurologic: Alert and oriented X 3, normal strength and tone. Normal  symmetric reflexes. Normal coordination and gait  ECOG PERFORMANCE STATUS: 2 - Symptomatic, <50% confined to bed  Blood pressure 114/71, pulse 97, temperature 98.2 F (36.8 C), temperature source Oral, resp. rate 19, height 5\' 11"  (1.803 m), weight 178 lb 3.2 oz (80.831 kg), SpO2 99 %.  LABORATORY DATA: Lab Results  Component Value Date   WBC 5.0 02/06/2014   HGB 9.6* 02/06/2014   HCT 28.8* 02/06/2014   MCV 92.3 02/06/2014   PLT 164 02/06/2014      Chemistry      Component Value Date/Time   NA 135* 02/06/2014 0947   NA 135 06/06/2013 1643   K 3.9 02/06/2014 0947   K 4.3 06/06/2013 1643   CL 100 06/06/2013 1643   CO2 25 02/06/2014 0947   CO2 26 06/06/2013 1643   BUN 10.3 02/06/2014 0947   BUN 14 06/06/2013 1643   CREATININE 1.3 02/06/2014 0947   CREATININE 1.1 06/06/2013 1643      Component Value Date/Time   CALCIUM 9.5 02/06/2014 0947   CALCIUM 9.4 06/06/2013 1643   ALKPHOS 112 02/06/2014 0947   ALKPHOS 79 06/06/2013 1643   AST 34 02/06/2014 0947   AST 20 06/06/2013 1643   ALT 21 02/06/2014 0947   ALT 19 06/06/2013 1643   BILITOT 0.47 02/06/2014  7782   BILITOT 0.5 06/06/2013 1643       RADIOGRAPHIC STUDIES: Ct Chest W Contrast  02/07/2014   CLINICAL DATA:  Followup metastatic lung carcinoma. Recently completed chemotherapy. Productive cough and hoarseness.  EXAM: CT CHEST, ABDOMEN, AND PELVIS WITH CONTRAST  TECHNIQUE: Multidetector CT imaging of the chest, abdomen and pelvis was performed following the standard protocol during bolus administration of intravenous contrast.  CONTRAST:  174mL OMNIPAQUE IOHEXOL 300 MG/ML  SOLN  COMPARISON:  12/16/2013  FINDINGS: CT CHEST FINDINGS  Mediastinum/Hilar Regions: Mild mediastinal lymphadenopathy shows no significant change, with largest lymph nodes in the AP window measuring 1.4 cm on image 25, unchanged from prior. Mild right hilar lymphadenopathy also stable. No new areas of lymphadenopathy identified.  Other Thoracic  Lymphadenopathy:  None.  Lungs: Spiculated nodule in the right lower lobe currently measures 1.8 x 2.6 cm on image 29 of series 4, without significant change compared to prior exam. Mild paramediastinal opacity in the right upper and middle lobes is most consistent with radiation change. 6 mm pulmonary nodule in the left lower lobe on image 47 is stable. No new or enlarging pulmonary nodules or masses identified.  Pleura:  No evidence of effusion or mass.  Vascular/Cardiac:  No acute findings identified.  Other:  None.  Musculoskeletal: No suspicious bone lesions identified. Multiple old left rib fracture deformities are unchanged in appearance PET  CT ABDOMEN AND PELVIS FINDINGS  Hepatobiliary: No masses or other significant abnormality identified.  Pancreas: No mass, inflammatory changes, or other parenchymal abnormality identified.  Spleen:  Within normal limits in size and appearance.  Adrenal Glands: Bilateral adrenal masses show no significant change in size or appearance 3.2 cm on the right and 2.9 cm left.  Kidneys/Urinary Tract: No masses identified. No evidence of hydronephrosis.  Stomach/Bowel/Peritoneum: No evidence of wall thickening, mass, or obstruction.  Vascular/Lymphatic: No pathologically enlarged lymph nodes identified within the abdomen. Mild right external iliac lymphadenopathy measures 1.8 cm on image 106 of series 2 compared to 1.2 cm previously. Shotty bilateral inguinal lymph nodes also seen, largest on the right measuring 1.5 cm on image 125, series 2 compared to 1.3 cm previously.  Reproductive:  No mass or other significant abnormality identified.  Other:  None.  Musculoskeletal:  No suspicious bone lesions identified.  IMPRESSION: No significant change in size or appearance of primary carcinoma in the superior segment of the right lower lobe.  Stable mild mediastinal and right hilar lymphadenopathy.  Stable bilateral adrenal metastases.  Minimal increase in size of several right  external iliac and inguinal lymph nodes. Slight progression in metastatic disease cannot be excluded. No other new or progressive disease identified.   Electronically Signed   By: Earle Gell M.D.   On: 02/07/2014 16:05   Mr Jeri Cos [facility] Contrast  02/03/2014   CLINICAL DATA:  SRS examination. Lung cancer, intracranial metastases. Status post whole-brain radiation completed February 2015.  EXAM: MRI HEAD WITHOUT AND WITH CONTRAST  TECHNIQUE: Multiplanar, multiecho pulse sequences of the brain and surrounding structures were obtained without and with intravenous contrast.  CONTRAST:  78mL MULTIHANCE GADOBENATE DIMEGLUMINE 529 MG/ML IV SOLN  COMPARISON:  11/15/2013 Most recent.  FINDINGS: There is significant worsening of multiple intracranial metastases. On the previous study, these were height of last at at least 37 intracranial lesions along with leptomeningeal disease. Worsening is most notable in the posterior fossa metastases, where an index lesion in the midline inferior vermis measures 12 x 10 x 7 mm, as  compared with 8 x 7 x 3 previously. There are no lesions which have improved or resolved, but most have enlarged. A few of the lesions now display developing vasogenic edema, most notable in the RIGHT frontal subcortical white matter, and BILATERAL cerebellum.  Redemonstrated are small foci of microhemorrhage associated with some of the lesions, possible post treatment effect. No other significant findings.  IMPRESSION: Significant worsening of most, if not all multiple intracranial metastases.  Some lesions are now developing vasogenic edema.   Electronically Signed   By: Rolla Flatten M.D.   On: 02/03/2014 11:11   Ct Abdomen Pelvis W Contrast  02/07/2014   CLINICAL DATA:  Followup metastatic lung carcinoma. Recently completed chemotherapy. Productive cough and hoarseness.  EXAM: CT CHEST, ABDOMEN, AND PELVIS WITH CONTRAST  TECHNIQUE: Multidetector CT imaging of the chest, abdomen and pelvis was performed  following the standard protocol during bolus administration of intravenous contrast.  CONTRAST:  144mL OMNIPAQUE IOHEXOL 300 MG/ML  SOLN  COMPARISON:  12/16/2013  FINDINGS: CT CHEST FINDINGS  Mediastinum/Hilar Regions: Mild mediastinal lymphadenopathy shows no significant change, with largest lymph nodes in the AP window measuring 1.4 cm on image 25, unchanged from prior. Mild right hilar lymphadenopathy also stable. No new areas of lymphadenopathy identified.  Other Thoracic Lymphadenopathy:  None.  Lungs: Spiculated nodule in the right lower lobe currently measures 1.8 x 2.6 cm on image 29 of series 4, without significant change compared to prior exam. Mild paramediastinal opacity in the right upper and middle lobes is most consistent with radiation change. 6 mm pulmonary nodule in the left lower lobe on image 47 is stable. No new or enlarging pulmonary nodules or masses identified.  Pleura:  No evidence of effusion or mass.  Vascular/Cardiac:  No acute findings identified.  Other:  None.  Musculoskeletal: No suspicious bone lesions identified. Multiple old left rib fracture deformities are unchanged in appearance PET  CT ABDOMEN AND PELVIS FINDINGS  Hepatobiliary: No masses or other significant abnormality identified.  Pancreas: No mass, inflammatory changes, or other parenchymal abnormality identified.  Spleen:  Within normal limits in size and appearance.  Adrenal Glands: Bilateral adrenal masses show no significant change in size or appearance 3.2 cm on the right and 2.9 cm left.  Kidneys/Urinary Tract: No masses identified. No evidence of hydronephrosis.  Stomach/Bowel/Peritoneum: No evidence of wall thickening, mass, or obstruction.  Vascular/Lymphatic: No pathologically enlarged lymph nodes identified within the abdomen. Mild right external iliac lymphadenopathy measures 1.8 cm on image 106 of series 2 compared to 1.2 cm previously. Shotty bilateral inguinal lymph nodes also seen, largest on the right  measuring 1.5 cm on image 125, series 2 compared to 1.3 cm previously.  Reproductive:  No mass or other significant abnormality identified.  Other:  None.  Musculoskeletal:  No suspicious bone lesions identified.  IMPRESSION: No significant change in size or appearance of primary carcinoma in the superior segment of the right lower lobe.  Stable mild mediastinal and right hilar lymphadenopathy.  Stable bilateral adrenal metastases.  Minimal increase in size of several right external iliac and inguinal lymph nodes. Slight progression in metastatic disease cannot be excluded. No other new or progressive disease identified.   Electronically Signed   By: Earle Gell M.D.   On: 02/07/2014 16:05    ASSESSMENT AND PLAN: This is a very pleasant 53 years old white male with stage IV non-small cell lung cancer, adenocarcinoma. He status post repeat induction chemotherapy with reduced dose carboplatin and Alimta. He has  rough time tolerating the treatment especially with the pancytopenia and frequent requirement for PRBCs transfusion. The recent CT scan of the chest, abdomen and pelvis showed no significant evidence for disease progression. Unfortunately the recent MRI of the brain showed disease progression in the brain and he is currently asymptomatic with frequent headaches. I had a lengthy discussion with the patient today about his condition. I strongly recommended for him to consider treatment to his brain lesion. He was concerned about the long-term memory deficit. Unfortunately his life expectancy is not long enough for him to suffer from the side effects. He finally agreed to consider repeat total brain irradiation as recommended by Dr. Tammi Klippel. I personally spoke to Dr. Tammi Klippel and he kindly agreed to see him today for evaluation and to proceed with the treatment. Regarding systemic treatment, his recent scan showed no significant evidence for disease progression and it will be reasonable to monitor the  patient for the next few weeks without any treatment. The tissue block had insufficient material for PDL 1 testing and I may consider the patient for treatment with Nivolumab if needed in the near future after treatment of his brain lesions. The patient agreed to the current plan. He would come back for follow-up visit in 3 weeks for reevaluation and more detailed discussion of his treatment options. He was advised to call immediately if he has any concerning symptoms in the interval.  The patient voices understanding of current disease status and treatment options and is in agreement with the current care plan.  All questions were answered. The patient knows to call the clinic with any problems, questions or concerns. We can certainly see the patient much sooner if necessary. I spent 20 minutes in counseling with the patient out of the total visit time 30 minutes.  Disclaimer: This note was dictated with voice recognition software. Similar sounding words can inadvertently be transcribed and may not be corrected upon review.

## 2014-02-10 ENCOUNTER — Encounter: Payer: Self-pay | Admitting: Internal Medicine

## 2014-02-10 DIAGNOSIS — Z510 Encounter for antineoplastic radiation therapy: Secondary | ICD-10-CM | POA: Diagnosis not present

## 2014-02-13 ENCOUNTER — Ambulatory Visit
Admission: RE | Admit: 2014-02-13 | Discharge: 2014-02-13 | Disposition: A | Discharge status: Home / Self Care | Payer: BLUE CROSS/BLUE SHIELD | Source: Ambulatory Visit | Attending: Radiation Oncology | Admitting: Radiation Oncology

## 2014-02-13 ENCOUNTER — Encounter: Payer: Self-pay | Admitting: Radiation Oncology

## 2014-02-13 DIAGNOSIS — Z510 Encounter for antineoplastic radiation therapy: Secondary | ICD-10-CM | POA: Diagnosis not present

## 2014-02-14 ENCOUNTER — Ambulatory Visit
Admission: RE | Admit: 2014-02-14 | Discharge: 2014-02-14 | Disposition: A | Discharge status: Home / Self Care | Payer: BLUE CROSS/BLUE SHIELD | Source: Ambulatory Visit | Attending: Radiation Oncology | Admitting: Radiation Oncology

## 2014-02-14 DIAGNOSIS — Z510 Encounter for antineoplastic radiation therapy: Secondary | ICD-10-CM | POA: Diagnosis not present

## 2014-02-15 ENCOUNTER — Ambulatory Visit
Admission: RE | Admit: 2014-02-15 | Discharge: 2014-02-15 | Disposition: A | Discharge status: Home / Self Care | Payer: BLUE CROSS/BLUE SHIELD | Source: Ambulatory Visit | Attending: Radiation Oncology | Admitting: Radiation Oncology

## 2014-02-15 ENCOUNTER — Ambulatory Visit
Admission: RE | Admit: 2014-02-15 | Discharge: 2014-02-15 | Disposition: A | Discharge status: Home / Self Care | Payer: BC Managed Care – PPO | Source: Ambulatory Visit | Attending: Radiation Oncology | Admitting: Radiation Oncology

## 2014-02-15 VITALS — BP 118/81 | HR 101 | Temp 98.1°F | Resp 20

## 2014-02-15 DIAGNOSIS — C7931 Secondary malignant neoplasm of brain: Secondary | ICD-10-CM

## 2014-02-15 DIAGNOSIS — Z510 Encounter for antineoplastic radiation therapy: Secondary | ICD-10-CM | POA: Diagnosis not present

## 2014-02-15 NOTE — Progress Notes (Signed)
Bryan Rice is here in a wheelchair.  He denies pain, dizziness and headaches.  He reports a poor appetite.  He does not want to see the dietician yet.  [facility].

## 2014-02-15 NOTE — Progress Notes (Signed)
   Department of Radiation Oncology  Phone:  [phone] Fax:        [phone]  Weekly Treatment Note    Name: Bryan Rice Date: 02/15/2014 MRN: [id number] DOB: March 02, 1960   Current dose: 7.5 Gy  Current fraction: 3   MEDICATIONS: Current Outpatient Prescriptions  Medication Sig Dispense Refill  . amiodarone (PACERONE) 200 MG tablet Take 200 mg by mouth daily.    Marland Kitchen acetaminophen (TYLENOL) 325 MG tablet Take 650 mg by mouth every 6 (six) hours as needed.    . bisacodyl (DULCOLAX) 5 MG EC tablet Take 5 mg by mouth daily as needed for moderate constipation.    Marland Kitchen dexamethasone (DECADRON) 4 MG tablet 4 mg po bid the day before, day of and day after chemotherapy every 3 weeks (Patient not taking: Reported on 02/15/2014) 40 tablet 1  . prochlorperazine (COMPAZINE) 10 MG tablet Take 1 tablet (10 mg total) by mouth every 6 (six) hours as needed for nausea or vomiting. (Patient not taking: Reported on 02/06/2014) 30 tablet 0   No current facility-administered medications for this encounter.     ALLERGIES: Review of patient's allergies indicates no known allergies.   LABORATORY DATA:  Lab Results  Component Value Date   WBC 5.0 02/06/2014   HGB 9.6* 02/06/2014   HCT 28.8* 02/06/2014   MCV 92.3 02/06/2014   PLT 164 02/06/2014   Lab Results  Component Value Date   NA 135* 02/06/2014   K 3.9 02/06/2014   CL 100 06/06/2013   CO2 25 02/06/2014   Lab Results  Component Value Date   ALT 21 02/06/2014   AST 34 02/06/2014   ALKPHOS 112 02/06/2014   BILITOT 0.47 02/06/2014     NARRATIVE: Bryan Rice was seen today for weekly treatment management. The chart was checked and the patient's films were reviewed.  The patient states he is doing well. Poor appetite but this began prior to radiation treatment. No significant headaches, nausea. The patient denies taking steroids at this time.  PHYSICAL EXAMINATION: oral temperature is 98.1 F (36.7 C). His blood pressure is  118/81 and his pulse is 101. His respiration is 20 and oxygen saturation is 100%.        ASSESSMENT: The patient is doing satisfactorily with treatment.  PLAN: We will continue with the patient's radiation treatment as planned.

## 2014-02-16 ENCOUNTER — Ambulatory Visit
Admission: RE | Admit: 2014-02-16 | Discharge: 2014-02-16 | Disposition: A | Discharge status: Home / Self Care | Payer: BLUE CROSS/BLUE SHIELD | Source: Ambulatory Visit | Attending: Radiation Oncology | Admitting: Radiation Oncology

## 2014-02-16 DIAGNOSIS — Z510 Encounter for antineoplastic radiation therapy: Secondary | ICD-10-CM | POA: Diagnosis not present

## 2014-02-20 ENCOUNTER — Ambulatory Visit
Admission: RE | Admit: 2014-02-20 | Discharge: 2014-02-20 | Disposition: A | Discharge status: Home / Self Care | Payer: BLUE CROSS/BLUE SHIELD | Source: Ambulatory Visit | Attending: Radiation Oncology | Admitting: Radiation Oncology

## 2014-02-20 DIAGNOSIS — Z510 Encounter for antineoplastic radiation therapy: Secondary | ICD-10-CM | POA: Diagnosis not present

## 2014-02-21 ENCOUNTER — Ambulatory Visit
Admission: RE | Admit: 2014-02-21 | Discharge: 2014-02-21 | Disposition: A | Discharge status: Home / Self Care | Payer: BLUE CROSS/BLUE SHIELD | Source: Ambulatory Visit | Attending: Radiation Oncology | Admitting: Radiation Oncology

## 2014-02-21 DIAGNOSIS — Z510 Encounter for antineoplastic radiation therapy: Secondary | ICD-10-CM | POA: Diagnosis not present

## 2014-02-22 ENCOUNTER — Ambulatory Visit
Admission: RE | Admit: 2014-02-22 | Discharge: 2014-02-22 | Disposition: A | Discharge status: Home / Self Care | Payer: BC Managed Care – PPO | Source: Ambulatory Visit | Attending: Radiation Oncology | Admitting: Radiation Oncology

## 2014-02-22 ENCOUNTER — Encounter: Payer: Self-pay | Admitting: Radiation Oncology

## 2014-02-22 ENCOUNTER — Ambulatory Visit
Admission: RE | Admit: 2014-02-22 | Discharge: 2014-02-22 | Disposition: A | Discharge status: Home / Self Care | Payer: BLUE CROSS/BLUE SHIELD | Source: Ambulatory Visit | Attending: Radiation Oncology | Admitting: Radiation Oncology

## 2014-02-22 VITALS — BP 114/83 | HR 62 | Resp 16

## 2014-02-22 DIAGNOSIS — C7931 Secondary malignant neoplasm of brain: Secondary | ICD-10-CM

## 2014-02-22 DIAGNOSIS — Z510 Encounter for antineoplastic radiation therapy: Secondary | ICD-10-CM | POA: Diagnosis not present

## 2014-02-22 NOTE — Progress Notes (Signed)
  Radiation Oncology         [phone] ________________________________  Name: Bryan Rice MRN: [id number]  Date: 02/22/2014  DOB: January 16, 1961  Weekly Radiation Therapy Management    ICD-9-CM ICD-10-CM   1. Brain metastases 198.3 C79.31     Current Dose: 17.5 Gy     Planned Dose:  25 Gy  Narrative . . . . . . . . The patient presents for routine under treatment assessment.                                  Right leg is completely numbness since Friday. Reports right leg is very weak. Denies pain. Denies headache or dizziness. Reports poor appetite continues but, still refuses dietician. Denies diplopia. Reports ringing in both ears continuously. Reports mild vertigo. Hoarseness continues. Vitals stable.                                  Set-up films were reviewed.                                 The chart was checked. Physical Findings. . .  blood pressure is 114/83 and his pulse is 62. His respiration is 16. . Weight essentially stable.  No significant changes. Impression . . . . . . . The patient is tolerating radiation. Plan . . . . . . . . . . . . Continue treatment as planned.  Will consider dexamethasone if leg numbness persists.  ________________________________  Sheral Apley Tammi Klippel, M.D.

## 2014-02-22 NOTE — Progress Notes (Signed)
Right leg is completely numbness since Friday. Reports right leg is very weak. Denies pain. Denies headache or dizziness. Reports poor appetite continues but, still refuses dietician. Denies diplopia. Reports ringing in both ears continuously. Reports mild vertigo. Hoarseness continues. Vitals stable.

## 2014-02-23 ENCOUNTER — Ambulatory Visit
Admission: RE | Admit: 2014-02-23 | Discharge: 2014-02-23 | Disposition: A | Discharge status: Home / Self Care | Payer: BLUE CROSS/BLUE SHIELD | Source: Ambulatory Visit | Attending: Radiation Oncology | Admitting: Radiation Oncology

## 2014-02-23 DIAGNOSIS — Z510 Encounter for antineoplastic radiation therapy: Secondary | ICD-10-CM | POA: Diagnosis not present

## 2014-02-27 ENCOUNTER — Ambulatory Visit
Admission: RE | Admit: 2014-02-27 | Discharge: 2014-02-27 | Disposition: A | Discharge status: Home / Self Care | Payer: BLUE CROSS/BLUE SHIELD | Source: Ambulatory Visit | Attending: Radiation Oncology | Admitting: Radiation Oncology

## 2014-02-27 DIAGNOSIS — Z510 Encounter for antineoplastic radiation therapy: Secondary | ICD-10-CM | POA: Diagnosis not present

## 2014-02-28 ENCOUNTER — Telehealth: Payer: Self-pay | Admitting: Internal Medicine

## 2014-02-28 ENCOUNTER — Encounter: Payer: Self-pay | Admitting: Internal Medicine

## 2014-02-28 ENCOUNTER — Encounter: Payer: Self-pay | Admitting: Radiation Oncology

## 2014-02-28 ENCOUNTER — Ambulatory Visit
Admission: RE | Admit: 2014-02-28 | Discharge: 2014-02-28 | Disposition: A | Discharge status: Home / Self Care | Payer: BLUE CROSS/BLUE SHIELD | Source: Ambulatory Visit | Attending: Radiation Oncology | Admitting: Radiation Oncology

## 2014-02-28 ENCOUNTER — Ambulatory Visit (HOSPITAL_BASED_OUTPATIENT_CLINIC_OR_DEPARTMENT_OTHER): Payer: BLUE CROSS/BLUE SHIELD | Admitting: Internal Medicine

## 2014-02-28 ENCOUNTER — Other Ambulatory Visit (HOSPITAL_BASED_OUTPATIENT_CLINIC_OR_DEPARTMENT_OTHER): Payer: BLUE CROSS/BLUE SHIELD

## 2014-02-28 VITALS — BP 123/101 | HR 115 | Temp 97.6°F | Resp 21 | Ht 71.0 in | Wt 164.0 lb

## 2014-02-28 DIAGNOSIS — C7931 Secondary malignant neoplasm of brain: Secondary | ICD-10-CM

## 2014-02-28 DIAGNOSIS — C3411 Malignant neoplasm of upper lobe, right bronchus or lung: Secondary | ICD-10-CM

## 2014-02-28 DIAGNOSIS — Z510 Encounter for antineoplastic radiation therapy: Secondary | ICD-10-CM | POA: Diagnosis not present

## 2014-02-28 DIAGNOSIS — F17200 Nicotine dependence, unspecified, uncomplicated: Secondary | ICD-10-CM

## 2014-02-28 DIAGNOSIS — C3491 Malignant neoplasm of unspecified part of right bronchus or lung: Secondary | ICD-10-CM

## 2014-02-28 LAB — CBC WITH DIFFERENTIAL/PLATELET
BASO%: 0.8 % (ref 0.0–2.0)
Basophils Absolute: 0.1 10*3/uL (ref 0.0–0.1)
EOS ABS: 0.2 10*3/uL (ref 0.0–0.5)
EOS%: 2.6 % (ref 0.0–7.0)
HCT: 32.2 % — ABNORMAL LOW (ref 38.4–49.9)
HGB: 10.6 g/dL — ABNORMAL LOW (ref 13.0–17.1)
LYMPH%: 17.5 % (ref 14.0–49.0)
MCH: 31.8 pg (ref 27.2–33.4)
MCHC: 32.9 g/dL (ref 32.0–36.0)
MCV: 96.7 fL (ref 79.3–98.0)
MONO#: 1.4 10*3/uL — ABNORMAL HIGH (ref 0.1–0.9)
MONO%: 21.6 % — ABNORMAL HIGH (ref 0.0–14.0)
NEUT%: 57.5 % (ref 39.0–75.0)
NEUTROS ABS: 3.8 10*3/uL (ref 1.5–6.5)
PLATELETS: 132 10*3/uL — AB (ref 140–400)
RBC: 3.33 10*6/uL — ABNORMAL LOW (ref 4.20–5.82)
RDW: 19.7 % — AB (ref 11.0–14.6)
WBC: 6.6 10*3/uL (ref 4.0–10.3)
lymph#: 1.2 10*3/uL (ref 0.9–3.3)

## 2014-02-28 LAB — COMPREHENSIVE METABOLIC PANEL (CC13)
ALT: 16 U/L (ref 0–55)
AST: 31 U/L (ref 5–34)
Albumin: 3.3 g/dL — ABNORMAL LOW (ref 3.5–5.0)
Alkaline Phosphatase: 100 U/L (ref 40–150)
Anion Gap: 14 mEq/L — ABNORMAL HIGH (ref 3–11)
BUN: 25.8 mg/dL (ref 7.0–26.0)
CO2: 23 mEq/L (ref 22–29)
Calcium: 10.2 mg/dL (ref 8.4–10.4)
Chloride: 100 mEq/L (ref 98–109)
Creatinine: 1.8 mg/dL — ABNORMAL HIGH (ref 0.7–1.3)
EGFR: 41 mL/min/{1.73_m2} — ABNORMAL LOW (ref >90)
Glucose: 87 mg/dl (ref 70–140)
Potassium: 3.8 mEq/L (ref 3.5–5.1)
Sodium: 137 mEq/L (ref 136–145)
Total Bilirubin: 0.96 mg/dL (ref 0.20–1.20)
Total Protein: 7.5 g/dL (ref 6.4–8.3)

## 2014-02-28 NOTE — Progress Notes (Signed)
[place] [phone]) [phone]   Fax:([phone]) [phone]  OFFICE PROGRESS NOTE  Pcp Not In System No address on file  DIAGNOSIS: Lung cancer, primary, with metastasis from lung to other site  Primary site: Lung (Right)  Staging method: AJCC 7th Edition  Clinical: Stage IV (T1b, N2, M1b) signed by Curt Bears, MD on 10/07/2013 9:33 PM  Summary: Stage IV (T1b, N2, M1b)  PRIOR THERAPY:  1) induction chemotherapy with carboplatin and Alimta at [facility] with partial response. 2) whole brain irradiation completed in February 2015 also at [facility]. 3) Systemic chemotherapy with carboplatin for an AUC of 5 and Alimta 500 mg per meter squared given every 3 weeks. Starting with cycle 2 the carboplatin is at an AUC of 4 and the Alimta at 375 mg per meter square given every 3 weeks. Status post 4 cycles. Discontinued secondary to intolerance but the patient had stable disease. 4) Repeat whole brain irradiation under the care of Dr. Tammi Klippel, last fraction was given 02/28/2014.   CURRENT THERAPY: Immunotherapy with Nivolumab 3 MG/KG every 2 weeks. First dose 03/07/2014.  INTERVAL HISTORY: Bryan Rice 54 y.o. male returns to the clinic today for follow-up visit. The patient just completed repeat whole brain irradiation under the care of Dr. Tammi Klippel earlier today. He is feeling fine except for the fatigue and weakness in his lower extremities. He also continues to have shortness of breath with exertion. He denied having any current chest pain but has mild cough with no hemoptysis. The patient denied having any significant nausea or vomiting, no fever or chills. He is here today for evaluation and recommendation regarding treatment of his condition.  MEDICAL HISTORY: Past Medical History  Diagnosis Date  . Alcohol abuse   . Tobacco abuse   . Atrial fibrillation     s/p TEE-DCCV 11/12;  amiodarone started 02/03/11  . Chronic systolic heart  failure   . Arthritis     left elbow  . Gout   . Cardiomyopathy     In the setting of afib with rvr 11/12;  Echo 01/19/11:  Diffuse HK worse in Inf wall and septum, mod LVE, mild LVH, EF 30%, mild BAE  . DM2 (diabetes mellitus, type 2)     Hemoglobin A1c 7.1 in 11/12/pt.states he's not diabetic  . 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  MEDICATIONS:  Current Outpatient Prescriptions  Medication Sig Dispense Refill  . acetaminophen (TYLENOL) 325 MG tablet Take 650 mg by mouth every 6 (six) hours as needed.    Marland Kitchen amiodarone (PACERONE) 200 MG tablet Take 200 mg by mouth daily.    . bisacodyl (DULCOLAX) 5 MG EC tablet Take 5 mg by mouth daily as needed for moderate constipation.    Marland Kitchen dexamethasone (DECADRON) 4 MG tablet 4 mg po bid the day before, day of and day after chemotherapy every 3 weeks 40 tablet 1  . prochlorperazine (COMPAZINE) 10 MG tablet Take 1 tablet (10 mg total) by mouth every 6 (six) hours as needed for nausea or vomiting. 30 tablet 0   No current facility-administered medications for this visit.    SURGICAL HISTORY:  Past Surgical History  Procedure Laterality Date  . Cardioversion  01/20/2011    Procedure: CARDIOVERSION;  Surgeon: Fay Records, MD;  Location: [place];  Service: Cardiovascular;  Laterality: N/A;  . [place] Dates  without cardioversion  01/20/2011    Procedure: TRANSESOPHAGEAL ECHOCARDIOGRAM (TEE);  Surgeon: Fay Records, MD;  Location: [facility] ENDOSCOPY;  Service: Cardiovascular;  Laterality: N/A;  . Cardioversion  01/20/2011    Procedure: CARDIOVERSION;  Surgeon: Fay Records, MD;  Location: [facility] ENDOSCOPY;  Service: Cardiovascular;  Laterality: N/A;  . Cardioversion  02/27/2011    Procedure: CARDIOVERSION;  Surgeon: Lelon Perla, MD;  Location: [place];  Service: Cardiovascular;  Laterality: N/A;    REVIEW OF SYSTEMS:  Constitutional: positive for anorexia and fatigue Eyes: negative Ears, nose,  mouth, throat, and face: negative Respiratory: positive for dyspnea on exertion Cardiovascular: negative Gastrointestinal: negative Genitourinary:negative Integument/breast: negative Hematologic/lymphatic: negative Musculoskeletal:positive for muscle weakness Neurological: positive for headaches Behavioral/Psych: negative Endocrine: negative Allergic/Immunologic: negative   PHYSICAL EXAMINATION: General appearance: alert, cooperative, fatigued and no distress Head: Normocephalic, without obvious abnormality, atraumatic Neck: no adenopathy, no JVD, supple, symmetrical, trachea midline and thyroid not enlarged, symmetric, no tenderness/mass/nodules Lymph nodes: Cervical, supraclavicular, and axillary nodes normal. Resp: clear to auscultation bilaterally Back: symmetric, no curvature. ROM normal. No CVA tenderness. Cardio: regular rate and rhythm, S1, S2 normal, no murmur, click, rub or gallop GI: soft, non-tender; bowel sounds normal; no masses,  no organomegaly Extremities: extremities normal, atraumatic, no cyanosis or edema Neurologic: Alert and oriented X 3, normal strength and tone. Normal symmetric reflexes. Normal coordination and gait  ECOG PERFORMANCE STATUS: 2 - Symptomatic, <50% confined to bed  Blood pressure 123/101, pulse 115, temperature 97.6 F (36.4 C), temperature source Oral, resp. rate 21, height 5\' 11"  (1.803 m), weight 164 lb (74.39 kg), SpO2 96 %.  LABORATORY DATA: Lab Results  Component Value Date   WBC 6.6 02/28/2014   HGB 10.6* 02/28/2014   HCT 32.2* 02/28/2014   MCV 96.7 02/28/2014   PLT 132* 02/28/2014      Chemistry      Component Value Date/Time   NA 137 02/28/2014 1056   NA 135 06/06/2013 1643   K 3.8 02/28/2014 1056   K 4.3 06/06/2013 1643   CL 100 06/06/2013 1643   CO2 23 02/28/2014 1056   CO2 26 06/06/2013 1643   BUN 25.8 02/28/2014 1056   BUN 14 06/06/2013 1643   CREATININE 1.8* 02/28/2014 1056   CREATININE 1.1 06/06/2013 1643        Component Value Date/Time   CALCIUM 10.2 02/28/2014 1056   CALCIUM 9.4 06/06/2013 1643   ALKPHOS 100 02/28/2014 1056   ALKPHOS 79 06/06/2013 1643   AST 31 02/28/2014 1056   AST 20 06/06/2013 1643   ALT 16 02/28/2014 1056   ALT 19 06/06/2013 1643   BILITOT 0.96 02/28/2014 1056   BILITOT 0.5 06/06/2013 1643       RADIOGRAPHIC STUDIES: Ct Chest W Contrast  02/07/2014   CLINICAL DATA:  Followup metastatic lung carcinoma. Recently completed chemotherapy. Productive cough and hoarseness.  EXAM: CT CHEST, ABDOMEN, AND PELVIS WITH CONTRAST  TECHNIQUE: Multidetector CT imaging of the chest, abdomen and pelvis was performed following the standard protocol during bolus administration of intravenous contrast.  CONTRAST:  176mL OMNIPAQUE IOHEXOL 300 MG/ML  SOLN  COMPARISON:  12/16/2013  FINDINGS: CT CHEST FINDINGS  Mediastinum/Hilar Regions: Mild mediastinal lymphadenopathy shows no significant change, with largest lymph nodes in the AP window measuring 1.4 cm on image 25, unchanged from prior. Mild right hilar lymphadenopathy also stable. No new areas of lymphadenopathy identified.  Other Thoracic Lymphadenopathy:  None.  Lungs: Spiculated nodule in the right lower lobe currently measures 1.8  x 2.6 cm on image 29 of series 4, without significant change compared to prior exam. Mild paramediastinal opacity in the right upper and middle lobes is most consistent with radiation change. 6 mm pulmonary nodule in the left lower lobe on image 47 is stable. No new or enlarging pulmonary nodules or masses identified.  Pleura:  No evidence of effusion or mass.  Vascular/Cardiac:  No acute findings identified.  Other:  None.  Musculoskeletal: No suspicious bone lesions identified. Multiple old left rib fracture deformities are unchanged in appearance PET  CT ABDOMEN AND PELVIS FINDINGS  Hepatobiliary: No masses or other significant abnormality identified.  Pancreas: No mass, inflammatory changes, or other parenchymal  abnormality identified.  Spleen:  Within normal limits in size and appearance.  Adrenal Glands: Bilateral adrenal masses show no significant change in size or appearance 3.2 cm on the right and 2.9 cm left.  Kidneys/Urinary Tract: No masses identified. No evidence of hydronephrosis.  Stomach/Bowel/Peritoneum: No evidence of wall thickening, mass, or obstruction.  Vascular/Lymphatic: No pathologically enlarged lymph nodes identified within the abdomen. Mild right external iliac lymphadenopathy measures 1.8 cm on image 106 of series 2 compared to 1.2 cm previously. Shotty bilateral inguinal lymph nodes also seen, largest on the right measuring 1.5 cm on image 125, series 2 compared to 1.3 cm previously.  Reproductive:  No mass or other significant abnormality identified.  Other:  None.  Musculoskeletal:  No suspicious bone lesions identified.  IMPRESSION: No significant change in size or appearance of primary carcinoma in the superior segment of the right lower lobe.  Stable mild mediastinal and right hilar lymphadenopathy.  Stable bilateral adrenal metastases.  Minimal increase in size of several right external iliac and inguinal lymph nodes. Slight progression in metastatic disease cannot be excluded. No other new or progressive disease identified.   Electronically Signed   By: Earle Gell M.D.   On: 02/07/2014 16:05   Mr Jeri Cos IP Contrast  02/03/2014   CLINICAL DATA:  SRS examination. Lung cancer, intracranial metastases. Status post whole-brain radiation completed February 2015.  EXAM: MRI HEAD WITHOUT AND WITH CONTRAST  TECHNIQUE: Multiplanar, multiecho pulse sequences of the brain and surrounding structures were obtained without and with intravenous contrast.  CONTRAST:  54mL MULTIHANCE GADOBENATE DIMEGLUMINE 529 MG/ML IV SOLN  COMPARISON:  11/15/2013 Most recent.  FINDINGS: There is significant worsening of multiple intracranial metastases. On the previous study, these were height of last at at least 37  intracranial lesions along with leptomeningeal disease. Worsening is most notable in the posterior fossa metastases, where an index lesion in the midline inferior vermis measures 12 x 10 x 7 mm, as compared with 8 x 7 x 3 previously. There are no lesions which have improved or resolved, but most have enlarged. A few of the lesions now display developing vasogenic edema, most notable in the RIGHT frontal subcortical white matter, and BILATERAL cerebellum.  Redemonstrated are small foci of microhemorrhage associated with some of the lesions, possible post treatment effect. No other significant findings.  IMPRESSION: Significant worsening of most, if not all multiple intracranial metastases.  Some lesions are now developing vasogenic edema.   Electronically Signed   By: Rolla Flatten M.D.   On: 02/03/2014 11:11   Ct Abdomen Pelvis W Contrast  02/07/2014   CLINICAL DATA:  Followup metastatic lung carcinoma. Recently completed chemotherapy. Productive cough and hoarseness.  EXAM: CT CHEST, ABDOMEN, AND PELVIS WITH CONTRAST  TECHNIQUE: Multidetector CT imaging of the chest, abdomen and pelvis  was performed following the standard protocol during bolus administration of intravenous contrast.  CONTRAST:  111mL OMNIPAQUE IOHEXOL 300 MG/ML  SOLN  COMPARISON:  12/16/2013  FINDINGS: CT CHEST FINDINGS  Mediastinum/Hilar Regions: Mild mediastinal lymphadenopathy shows no significant change, with largest lymph nodes in the AP window measuring 1.4 cm on image 25, unchanged from prior. Mild right hilar lymphadenopathy also stable. No new areas of lymphadenopathy identified.  Other Thoracic Lymphadenopathy:  None.  Lungs: Spiculated nodule in the right lower lobe currently measures 1.8 x 2.6 cm on image 29 of series 4, without significant change compared to prior exam. Mild paramediastinal opacity in the right upper and middle lobes is most consistent with radiation change. 6 mm pulmonary nodule in the left lower lobe on image 47  is stable. No new or enlarging pulmonary nodules or masses identified.  Pleura:  No evidence of effusion or mass.  Vascular/Cardiac:  No acute findings identified.  Other:  None.  Musculoskeletal: No suspicious bone lesions identified. Multiple old left rib fracture deformities are unchanged in appearance PET  CT ABDOMEN AND PELVIS FINDINGS  Hepatobiliary: No masses or other significant abnormality identified.  Pancreas: No mass, inflammatory changes, or other parenchymal abnormality identified.  Spleen:  Within normal limits in size and appearance.  Adrenal Glands: Bilateral adrenal masses show no significant change in size or appearance 3.2 cm on the right and 2.9 cm left.  Kidneys/Urinary Tract: No masses identified. No evidence of hydronephrosis.  Stomach/Bowel/Peritoneum: No evidence of wall thickening, mass, or obstruction.  Vascular/Lymphatic: No pathologically enlarged lymph nodes identified within the abdomen. Mild right external iliac lymphadenopathy measures 1.8 cm on image 106 of series 2 compared to 1.2 cm previously. Shotty bilateral inguinal lymph nodes also seen, largest on the right measuring 1.5 cm on image 125, series 2 compared to 1.3 cm previously.  Reproductive:  No mass or other significant abnormality identified.  Other:  None.  Musculoskeletal:  No suspicious bone lesions identified.  IMPRESSION: No significant change in size or appearance of primary carcinoma in the superior segment of the right lower lobe.  Stable mild mediastinal and right hilar lymphadenopathy.  Stable bilateral adrenal metastases.  Minimal increase in size of several right external iliac and inguinal lymph nodes. Slight progression in metastatic disease cannot be excluded. No other new or progressive disease identified.   Electronically Signed   By: Earle Gell M.D.   On: 02/07/2014 16:05    ASSESSMENT AND PLAN: This is a very pleasant 54 years old white male with stage IV non-small cell lung cancer, adenocarcinoma.  He status post repeat induction chemotherapy with reduced dose carboplatin and Alimta. He has rough time tolerating the treatment especially with the pancytopenia and frequent requirement for PRBCs transfusion. The recent CT scan of the chest, abdomen and pelvis showed no significant evidence for disease progression. The most recent MRI of the brain showed disease progression in the brain. He completed repeat course of whole brain irradiation today. I had a lengthy discussion with the patient today about his systemic treatment options. I gave the patient the option of palliative care and hospice referral versus consideration of immunotherapy with Nivolumab 3 MG/KG every 2 weeks. The patient is interested in proceeding with the systemic immunotherapy. I discussed with him the adverse effect of this treatment including but not limited to immune mediated skin rash, diarrhea, nephritis, hepatitis, hypophysitis, thyroid dysfunction. He would like to proceed with the treatment as planned and he is expected to start the first dose  of this treatment on 03/07/2014. I would ask the patient to start tapering his steroid dose before starting the first dose of his immunotherapy. For smoke cessation, the patient was encouraged to quit smoking but he is not willing to try. He would come back for follow-up visit in 3 weeks for reevaluation with the start of cycle #2. He was advised to call immediately if he has any concerning symptoms in the interval.  The patient voices understanding of current disease status and treatment options and is in agreement with the current care plan.  All questions were answered. The patient knows to call the clinic with any problems, questions or concerns. We can certainly see the patient much sooner if necessary. I spent 20 minutes in counseling with the patient out of the total visit time 30 minutes.  Disclaimer: This note was dictated with voice recognition software. Similar sounding  words can inadvertently be transcribed and may not be corrected upon review.

## 2014-02-28 NOTE — Telephone Encounter (Signed)
gv adn prnted appt sched and avs fo rpt for Jan adn Feb 2016.....sed added tx.

## 2014-03-03 ENCOUNTER — Telehealth: Payer: Self-pay | Admitting: *Deleted

## 2014-03-03 NOTE — Progress Notes (Signed)
  Radiation Oncology         [phone] ________________________________  Name: Bryan Rice MRN: [id number]  Date: 02/28/2014  DOB: 07/27/1960   ICD-9-CM ICD-10-CM    1. Brain metastases 198.3 C79.31    Diagnosis: 54 year old gentleman with recurrent brain metastases  Indication for treatment:  Palliation       Radiation treatment dates:  02/13/2014-02/28/2014  Site/dose:   The whole brain was re-irradiated to 25 Gy in 10 fractions of 2.5 Gy  Beams/energy:   Right and Left radiation fields were treated using 6 MV X-rays with custom MLC collimation to shield the eyes and face.  The patient was immobilized with a thermoplastic mask and isocenter was verified with weekly port films.  Narrative: The patient tolerated radiation treatment relatively well.   He experienced some right leg numbness and weakness. Denied pain. Denied headache or dizziness. Reported poor appetite but, still refused dietician. Denied diplopia. Reported some ringing in both ears continuously. Reported mild vertigo. Hoarseness continued.  Plan: The patient has completed radiation treatment. The patient will return to radiation oncology clinic for routine followup in one month. I advised them to call or return sooner if they have any questions or concerns related to their recovery or treatment. ________________________________  Sheral Apley. Tammi Klippel, M.D.

## 2014-03-03 NOTE — Telephone Encounter (Signed)
Received call from [place] at [facility] where pt resides.  She wanted to let us know pt appears to be declining.  They are having a care plan meeting on Monday 03/07/14 and she feels that hospice may be what the patient chooses.  Informed her that when pt saw Dr Vista Mink this week Dr Vista Mink gave the option of hospice vs. nivolumab and pt wanted treatment.  Asked for Sonia Baller to call us if he decides against treatment.  Sonia Baller call back [phone]

## 2014-03-06 NOTE — Addendum Note (Signed)
Encounter addended by: Knox Royalty, NP on: 03/06/2014  4:32 PM<BR>     Documentation filed: Clinical Notes

## 2014-03-07 ENCOUNTER — Ambulatory Visit (HOSPITAL_BASED_OUTPATIENT_CLINIC_OR_DEPARTMENT_OTHER): Payer: BLUE CROSS/BLUE SHIELD | Admitting: Nurse Practitioner

## 2014-03-07 ENCOUNTER — Other Ambulatory Visit: Payer: BLUE CROSS/BLUE SHIELD

## 2014-03-07 ENCOUNTER — Other Ambulatory Visit (HOSPITAL_BASED_OUTPATIENT_CLINIC_OR_DEPARTMENT_OTHER): Payer: BLUE CROSS/BLUE SHIELD

## 2014-03-07 ENCOUNTER — Ambulatory Visit (HOSPITAL_BASED_OUTPATIENT_CLINIC_OR_DEPARTMENT_OTHER): Payer: BLUE CROSS/BLUE SHIELD

## 2014-03-07 ENCOUNTER — Other Ambulatory Visit: Payer: Self-pay | Admitting: *Deleted

## 2014-03-07 ENCOUNTER — Telehealth: Payer: Self-pay | Admitting: *Deleted

## 2014-03-07 VITALS — BP 114/68 | HR 98 | Temp 97.8°F | Resp 24

## 2014-03-07 DIAGNOSIS — C3491 Malignant neoplasm of unspecified part of right bronchus or lung: Secondary | ICD-10-CM

## 2014-03-07 DIAGNOSIS — E860 Dehydration: Secondary | ICD-10-CM | POA: Insufficient documentation

## 2014-03-07 DIAGNOSIS — R770 Abnormality of albumin: Secondary | ICD-10-CM

## 2014-03-07 DIAGNOSIS — E8809 Other disorders of plasma-protein metabolism, not elsewhere classified: Secondary | ICD-10-CM

## 2014-03-07 DIAGNOSIS — C7931 Secondary malignant neoplasm of brain: Secondary | ICD-10-CM

## 2014-03-07 DIAGNOSIS — G893 Neoplasm related pain (acute) (chronic): Secondary | ICD-10-CM

## 2014-03-07 DIAGNOSIS — C3411 Malignant neoplasm of upper lobe, right bronchus or lung: Secondary | ICD-10-CM

## 2014-03-07 DIAGNOSIS — N289 Disorder of kidney and ureter, unspecified: Secondary | ICD-10-CM

## 2014-03-07 DIAGNOSIS — R531 Weakness: Secondary | ICD-10-CM

## 2014-03-07 LAB — COMPREHENSIVE METABOLIC PANEL (CC13)
ALBUMIN: 3 g/dL — AB (ref 3.5–5.0)
ALT: 18 U/L (ref 0–55)
ANION GAP: 12 meq/L — AB (ref 3–11)
AST: 41 U/L — ABNORMAL HIGH (ref 5–34)
Alkaline Phosphatase: 143 U/L (ref 40–150)
BUN: 35.4 mg/dL — AB (ref 7.0–26.0)
CALCIUM: 10.1 mg/dL (ref 8.4–10.4)
CO2: 25 meq/L (ref 22–29)
Chloride: 97 mEq/L — ABNORMAL LOW (ref 98–109)
Creatinine: 1.7 mg/dL — ABNORMAL HIGH (ref 0.7–1.3)
EGFR: 45 mL/min/{1.73_m2} — AB (ref >90)
GLUCOSE: 95 mg/dL (ref 70–140)
POTASSIUM: 4 meq/L (ref 3.5–5.1)
SODIUM: 135 meq/L — AB (ref 136–145)
TOTAL PROTEIN: 7.7 g/dL (ref 6.4–8.3)
Total Bilirubin: 1.18 mg/dL (ref 0.20–1.20)

## 2014-03-07 LAB — CBC WITH DIFFERENTIAL/PLATELET
BASO%: 0.4 % (ref 0.0–2.0)
Basophils Absolute: 0 10*3/uL (ref 0.0–0.1)
EOS%: 2.4 % (ref 0.0–7.0)
Eosinophils Absolute: 0.2 10*3/uL (ref 0.0–0.5)
HCT: 33.1 % — ABNORMAL LOW (ref 38.4–49.9)
HGB: 11.1 g/dL — ABNORMAL LOW (ref 13.0–17.1)
LYMPH%: 12 % — ABNORMAL LOW (ref 14.0–49.0)
MCH: 32.3 pg (ref 27.2–33.4)
MCHC: 33.5 g/dL (ref 32.0–36.0)
MCV: 96.2 fL (ref 79.3–98.0)
MONO#: 1.5 10*3/uL — AB (ref 0.1–0.9)
MONO%: 19.5 % — ABNORMAL HIGH (ref 0.0–14.0)
NEUT%: 65.7 % (ref 39.0–75.0)
NEUTROS ABS: 5 10*3/uL (ref 1.5–6.5)
PLATELETS: 100 10*3/uL — AB (ref 140–400)
RBC: 3.44 10*6/uL — ABNORMAL LOW (ref 4.20–5.82)
RDW: 18.5 % — AB (ref 11.0–14.6)
WBC: 7.6 10*3/uL (ref 4.0–10.3)
lymph#: 0.9 10*3/uL (ref 0.9–3.3)

## 2014-03-07 LAB — TSH CHCC: TSH: 0.09 m[IU]/L — AB (ref 0.320–4.118)

## 2014-03-07 MED ORDER — ONDANSETRON 8 MG/50ML IVPB (CHCC)
8.0000 mg | Freq: Once | INTRAVENOUS | Status: AC
  Administered 2014-03-07: 8 mg via INTRAVENOUS

## 2014-03-07 MED ORDER — MORPHINE SULFATE 4 MG/ML IJ SOLN
INTRAMUSCULAR | Status: AC
  Filled 2014-03-07: qty 1

## 2014-03-07 MED ORDER — MORPHINE SULFATE 4 MG/ML IJ SOLN
2.0000 mg | Freq: Once | INTRAMUSCULAR | Status: AC
  Administered 2014-03-07: 2 mg via INTRAVENOUS

## 2014-03-07 MED ORDER — ONDANSETRON 8 MG/NS 50 ML IVPB
INTRAVENOUS | Status: AC
  Filled 2014-03-07: qty 8

## 2014-03-07 MED ORDER — SODIUM CHLORIDE 0.9 % IV SOLN
INTRAVENOUS | Status: AC
  Administered 2014-03-07: 13:00:00 via INTRAVENOUS

## 2014-03-07 NOTE — Telephone Encounter (Signed)
Hospice referral called into hospice of [facility] per Dr Vista Mink.  Informed Sonia Baller at [facility]

## 2014-03-08 ENCOUNTER — Encounter: Payer: Self-pay | Admitting: Internal Medicine

## 2014-03-08 ENCOUNTER — Encounter: Payer: Self-pay | Admitting: Nurse Practitioner

## 2014-03-08 ENCOUNTER — Other Ambulatory Visit: Payer: Self-pay | Admitting: Nurse Practitioner

## 2014-03-08 DIAGNOSIS — N289 Disorder of kidney and ureter, unspecified: Secondary | ICD-10-CM | POA: Insufficient documentation

## 2014-03-08 DIAGNOSIS — G893 Neoplasm related pain (acute) (chronic): Secondary | ICD-10-CM | POA: Insufficient documentation

## 2014-03-08 DIAGNOSIS — R531 Weakness: Secondary | ICD-10-CM | POA: Insufficient documentation

## 2014-03-08 NOTE — Progress Notes (Signed)
Per Dr. Julien Nordmann-- patient is on Hospice so he didn't fill out form for [facility] auth.

## 2014-03-08 NOTE — Assessment & Plan Note (Signed)
Patient is status post carboplatin/Alimta chemotherapy last received on 01/03/2014.  Patient completed whole brain irradiation for previously diagnosed brain metastasis on 02/28/2014.  Patient presented to the [facility] to initiate Nivolumab immunotherapy.  However, patient has become progressively weaker within the past 1-2 weeks.  He now requires a 3 person assist to move from one area to another.  He has had minimal appetite; and appears dehydrated.  Patient spoke at length with Dr. Earlie Server; and the decision was made to hold any further active treatment.  Patient was in agreement to initiate hospice referral.

## 2014-03-08 NOTE — Assessment & Plan Note (Signed)
Patient has had little appetite and admits to minimal oral intake recently.  Patient does appears moderately dehydrated; and will receive 1 L normal saline IV fluid rehydration today.

## 2014-03-08 NOTE — Assessment & Plan Note (Signed)
Patient is experiencing progressive weakness; requiring increased assistance with any ambulation whatsoever.  Patient does appear very frail and fatigued.  Hopefully, receiving some IV fluid rehydration will help somewhat.

## 2014-03-08 NOTE — Assessment & Plan Note (Signed)
Patient suffers with chronic renal insufficiency.  Creatinine remained stable at 1.7.

## 2014-03-08 NOTE — Progress Notes (Signed)
will   SYMPTOM MANAGEMENT CLINIC   HPI: Bryan Rice 54 y.o. male diagnosed with lung cancer with brain metastasis.  Patient is status post carboplatin/Alimta chemotherapy regimen; as well as whole brain radiation.  Here today to initiate Nivolumab immunotherapy.  Patient presented to the [facility] today to receive his first cycle of Nivolumab.  He is complaining of progressive generalized weakness and fatigue for the past 1-2 weeks.  He states he has had minimal appetite and little oral intake.  He feels fairly dehydrated today.  Also complaining of some chronic, increased generalized pain.  He states he has been taking Tylenol with only minimal effectiveness.  He denies any nausea, vomiting, diarrhea, or constipation.  He denies any recent fevers or chills.   HPI  ROS  Past Medical History  Diagnosis Date  . Alcohol abuse   . Tobacco abuse   . Atrial fibrillation     s/p TEE-DCCV 11/12;  amiodarone started 02/03/11  . Chronic systolic heart failure   . Arthritis     left elbow  . Gout   . Cardiomyopathy     In the setting of afib with rvr 11/12;  Echo 01/19/11:  Diffuse HK worse in Inf wall and septum, mod LVE, mild LVH, EF 30%, mild BAE  . DM2 (diabetes mellitus, type 2)     Hemoglobin A1c 7.1 in 11/12/pt.states he's not diabetic  . 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 Cardioversion  01/20/2011    Procedure: CARDIOVERSION;  Surgeon: Fay Records, MD;  Location: [facility];  Service: Cardiovascular;  Laterality: N/A;  . Tee without cardioversion  01/20/2011    Procedure: TRANSESOPHAGEAL ECHOCARDIOGRAM (TEE);  Surgeon: Fay Records, MD;  Location[facility] ENDOSCOPY;  Service: Cardiovascular;  Laterality: N/A;  . Cardioversion  01/20/2011    Procedure: CARDIOVERSION;  Surgeon: Fay Records, MD;  Location: [facility];  Service: Cardiovascular;  Laterality: N/A;  . Cardioversion  02/27/2011   Procedure: CARDIOVERSION;  Surgeon: Lelon Perla, MD;  Location: [facility] OR;  Service: Cardiovascular;  Laterality: N/A;    has Atrial fibrillation; Chronic systolic heart failure; DM (diabetes mellitus); Tobacco use disorder; Cardiomyopathy, secondary; ETOH abuse; Encounter for long-term (current) use of anticoagulants; Snoring; Gout; Brain metastases; Lung cancer, primary, with metastasis from lung to other site; Physical deconditioning; Rash; Hypoalbuminemia; Other pancytopenia; Dehydration; Neoplasm related pain; Renal insufficiency; and Weakness on his problem list.     is allergic to morphine and related.    Medication List       This list is accurate as of: 03/07/14 11:59 PM.  Always use your most recent med list.               acetaminophen 325 MG tablet  Commonly known as:  TYLENOL  Take 650 mg by mouth every 6 (six) hours as needed.     amiodarone 200 MG tablet  Commonly known as:  PACERONE  Take 200 mg by mouth daily.     bisacodyl 5 MG EC tablet  Commonly known as:  DULCOLAX  Take 5 mg by mouth daily as needed for moderate constipation.     prochlorperazine 10 MG tablet  Commonly known as:  COMPAZINE  Take 1 tablet (10 mg total) by mouth every 6 (six) hours as needed for nausea or vomiting.         PHYSICAL EXAMINATION  121/76, HR 105, temp 97.8  Physical Exam  Constitutional: He is oriented to person, place, and time. Vital signs are normal. He appears malnourished and dehydrated. He appears unhealthy. He appears cachectic.  HENT:  Head: Normocephalic and atraumatic.  Mouth/Throat: Oropharynx is clear and moist.  Eyes: Conjunctivae and EOM are normal. Pupils are equal, round, and reactive to light. Right eye exhibits no discharge. Left eye exhibits no discharge. No scleral icterus.  Neck: Normal range of motion. Neck supple. No JVD present. No tracheal deviation present. No thyromegaly present.  Cardiovascular: Normal rate, regular rhythm, normal heart sounds  and intact distal pulses.   Pulmonary/Chest: Effort normal and breath sounds normal. No respiratory distress. He has no wheezes. He has no rales. He exhibits no tenderness.  Abdominal: Soft. Bowel sounds are normal. He exhibits no distension and no mass. There is no tenderness. There is no rebound and no guarding.  Musculoskeletal: Normal range of motion. He exhibits no edema or tenderness.  Lymphadenopathy:    He has no cervical adenopathy.  Neurological: He is alert and oriented to person, place, and time.  Patient appears very weak and frail.  Patient was in wheelchair during exam.  Skin: Skin is warm and dry. No rash noted. No erythema. There is pallor.  Psychiatric: Affect normal.  Nursing note and vitals reviewed.   LABORATORY DATA:. Appointment on 03/07/2014  Component Date Value Ref Range Status  . WBC 03/07/2014 7.6  4.0 - 10.3 10e3/uL Final  . NEUT# 03/07/2014 5.0  1.5 - 6.5 10e3/uL Final  . HGB 03/07/2014 11.1* 13.0 - 17.1 g/dL Final  . HCT 97/52/9553 33.1* 38.4 - 49.9 % Final  . Platelets 03/07/2014 100* 140 - 400 10e3/uL Final  . MCV 03/07/2014 96.2  79.3 - 98.0 fL Final  . MCH 03/07/2014 32.3  27.2 - 33.4 pg Final  . MCHC 03/07/2014 33.5  32.0 - 36.0 g/dL Final  . RBC 97/14/1067 3.44* 4.20 - 5.82 10e6/uL Final  . RDW 03/07/2014 18.5* 11.0 - 14.6 % Final  . lymph# 03/07/2014 0.9  0.9 - 3.3 10e3/uL Final  . MONO# 03/07/2014 1.5* 0.1 - 0.9 10e3/uL Final  . Eosinophils Absolute 03/07/2014 0.2  0.0 - 0.5 10e3/uL Final  . Basophils Absolute 03/07/2014 0.0  0.0 - 0.1 10e3/uL Final  . NEUT% 03/07/2014 65.7  39.0 - 75.0 % Final  . LYMPH% 03/07/2014 12.0* 14.0 - 49.0 % Final  . MONO% 03/07/2014 19.5* 0.0 - 14.0 % Final  . EOS% 03/07/2014 2.4  0.0 - 7.0 % Final  . BASO% 03/07/2014 0.4  0.0 - 2.0 % Final  . TSH 03/07/2014 0.090* 0.320 - 4.118 m(IU)/L Final  . Sodium 03/07/2014 135* 136 - 145 mEq/L Final  . Potassium 03/07/2014 4.0  3.5 - 5.1 mEq/L Final  . Chloride 03/07/2014  97* 98 - 109 mEq/L Final  . CO2 03/07/2014 25  22 - 29 mEq/L Final  . Glucose 03/07/2014 95  70 - 140 mg/dl Final  . BUN 76/16/0760 35.4* 7.0 - 26.0 mg/dL Final  . Creatinine 66/78/5547 1.7* 0.7 - 1.3 mg/dL Final  . Total Bilirubin 03/07/2014 1.18  0.20 - 1.20 mg/dL Final  . Alkaline Phosphatase 03/07/2014 143  40 - 150 U/L Final  . AST 03/07/2014 41* 5 - 34 U/L Final  . ALT 03/07/2014 18  0 - 55 U/L Final  . Total Protein 03/07/2014 7.7  6.4 - 8.3 g/dL Final  . Albumin 68/91/5525 3.0* 3.5 - 5.0 g/dL Final  . Calcium  03/07/2014 10.1  8.4 - 10.4 mg/dL Final  . Anion Gap 03/07/2014 12* 3 - 11 mEq/L Final  . EGFR 03/07/2014 45* >90 ml/min/1.73 m2 Final   eGFR is calculated using the CKD-EPI Creatinine Equation (2009)     RADIOGRAPHIC STUDIES: No results found.  ASSESSMENT/PLAN:    Dehydration Patient has had little appetite and admits to minimal oral intake recently.  Patient does appears moderately dehydrated; and will receive 1 L normal saline IV fluid rehydration today.   Hypoalbuminemia Albumin has decreased from 3.3 down to 3.0.  Patient was encouraged to push protein in his diet is much as possible.   Lung cancer, primary, with metastasis from lung to other site Patient is status post carboplatin/Alimta chemotherapy last received on 01/03/2014.  Patient completed whole brain irradiation for previously diagnosed brain metastasis on 02/28/2014.  Patient presented to the [facility] to initiate Nivolumab immunotherapy.  However, patient has become progressively weaker within the past 1-2 weeks.  He now requires a 3 person assist to move from one area to another.  He has had minimal appetite; and appears dehydrated.  Patient spoke at length with Dr. Earlie Server; and the decision was made to hold any further active treatment.  Patient was in agreement to initiate hospice referral.   Neoplasm related pain Patient is complaining of increasing, overall generalized pain.  He has been  taking Tylenol with minimal effectiveness.  Since patient will be obtaining a hospice referral and is pending hospice evaluation-will allow hospice team to manage patient's chronic pain.  Patient was given morphine 2 mg IV while at the cancer center receiving IV fluid rehydration.   Renal insufficiency Patient suffers with chronic renal insufficiency.  Creatinine remained stable at 1.7.   Weakness Patient is experiencing progressive weakness; requiring increased assistance with any ambulation whatsoever.  Patient does appear very frail and fatigued.  Hopefully, receiving some IV fluid rehydration will help somewhat.   Patient stated understanding of all instructions; and was in agreement with this plan of care. The patient knows to call the clinic with any problems, questions or concerns.   This was a shared visit with Dr. Julien Nordmann today.  Total time spent with patient was 25 minutes;  with greater than 75 percent of that time spent in face to face counseling regarding his symptoms, and coordination of care and follow up.  Disclaimer: This note was dictated with voice recognition software. Similar sounding words can inadvertently be transcribed and may not be corrected upon review.   Drue Second, NP 03/08/2014   ADDENDUM: Hematology/Oncology Attending: I had a face to face encounter with the patient. I recommended his care plan. This is a very pleasant 54 years old white male with metastatic non-small cell lung cancer with recurrent brain metastasis as well as progression of his disease. He is status post several chemotherapy regimens in the past but unfortunately continues to have evidence for disease progression. He was supposed to start the first cycle of his immunotherapy with Nivolumab today. The patient presented to the [facility] with significant fatigue and weakness and inability to move her legs or function in any reasonable fashion.  I had a lengthy discussion with the  patient today about his condition. I strongly recommended for him to discontinue any further treatment at this point and to consider palliative care and hospice.  He is in agreement with the current plan. We will call the palliative care and hospice service of [facility] to see the patient. I will arrange for  the patient to receive 1 L of normal saline today before disposition to the skilled nursing facility. I'll be happy to continue to coordinate his care with the hospice service. He was advised to call immediately if he has any concerning symptoms.  Disclaimer: This note was dictated with voice recognition software. Similar sounding words can inadvertently be transcribed and may be missed upon review. Eilleen Kempf., MD 03/08/2014

## 2014-03-08 NOTE — Assessment & Plan Note (Signed)
Albumin has decreased from 3.3 down to 3.0.  Patient was encouraged to push protein in his diet is much as possible.

## 2014-03-08 NOTE — Assessment & Plan Note (Addendum)
Patient is complaining of increasing, overall generalized pain.  He has been taking Tylenol with minimal effectiveness.  Since patient will be obtaining a hospice referral and is pending hospice evaluation-will allow hospice team to manage patient's chronic pain.  Patient was given morphine 2 mg IV while at the cancer center receiving IV fluid rehydration.

## 2014-03-20 NOTE — Progress Notes (Signed)
  Radiation Oncology         ([phone]) [phone] ________________________________  Name: Bryan Rice  MRN: [id number]  Date: 02/13/2014  DOB: [birth year]  Simulation Verification Note  Status: outpatient  NARRATIVE: The patient was brought to the treatment unit and placed in the planned treatment position. The clinical setup was verified. Then port films were obtained and uploaded to the radiation oncology medical record software.  The treatment beams were carefully compared against the planned radiation fields. The position location and shape of the radiation fields was reviewed. They targeted volume of tissue appears to be appropriately covered by the radiation beams. Organs at risk appear to be excluded as planned.  Based on my personal review, I approved the simulation verification. The patient's treatment will proceed as planned.  ------------------------------------------------  Sheral Apley Tammi Klippel, M.D.

## 2014-03-21 ENCOUNTER — Encounter: Payer: Self-pay | Admitting: Medical Oncology

## 2014-03-21 ENCOUNTER — Ambulatory Visit: Payer: BLUE CROSS/BLUE SHIELD

## 2014-03-21 ENCOUNTER — Other Ambulatory Visit: Payer: BLUE CROSS/BLUE SHIELD

## 2014-03-21 ENCOUNTER — Ambulatory Visit: Payer: BLUE CROSS/BLUE SHIELD | Admitting: Physician Assistant

## 2014-03-21 NOTE — Progress Notes (Signed)
Pts obituary in news and record today.

## 2014-03-27 DEATH — deceased

## 2014-04-03 ENCOUNTER — Ambulatory Visit
Admission: RE | Admit: 2014-04-03 | Payer: BC Managed Care – PPO | Source: Ambulatory Visit | Admitting: Radiation Oncology

## 2014-04-04 ENCOUNTER — Ambulatory Visit: Payer: BLUE CROSS/BLUE SHIELD

## 2014-04-04 ENCOUNTER — Other Ambulatory Visit: Payer: BLUE CROSS/BLUE SHIELD

## 2014-04-18 ENCOUNTER — Other Ambulatory Visit: Payer: BLUE CROSS/BLUE SHIELD

## 2014-04-18 ENCOUNTER — Ambulatory Visit: Payer: BLUE CROSS/BLUE SHIELD

## 2015-05-11 ENCOUNTER — Other Ambulatory Visit: Payer: Self-pay | Admitting: Nurse Practitioner

## 2015-12-09 IMAGING — CT CT CHEST W/ CM
2 of 5 series · 16 of 46 positions shown, 18 images · IV contrast (OMNIPAQUE)
Comparison: 12/16/2013

CLINICAL DATA: Followup metastatic lung carcinoma. Recently
completed chemotherapy. Productive cough and hoarseness.

EXAM:
CT CHEST, ABDOMEN, AND PELVIS WITH CONTRAST
TECHNIQUE: Multidetector CT imaging of the chest, abdomen and pelvis was
performed following the standard protocol during bolus
administration of intravenous contrast.
CONTRAST:  100mL OMNIPAQUE IOHEXOL 300 MG/ML  SOLN

[Series 2: cap with st · axial · 0.85mm/px · z∈[-604,-10]mm · 13 of 135 slices shown, 15 images]
[im 8/135  soft-tissue]
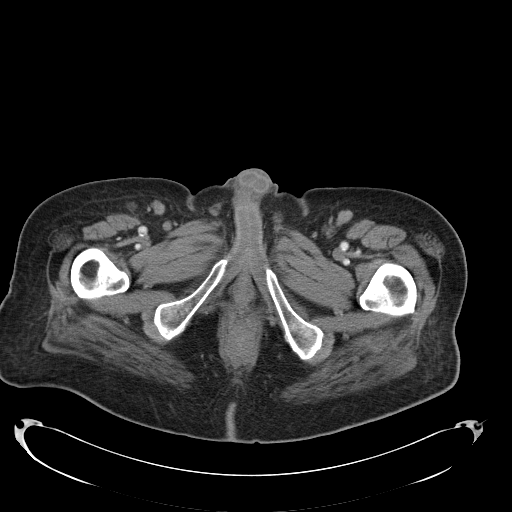
[im 8/135  bone]
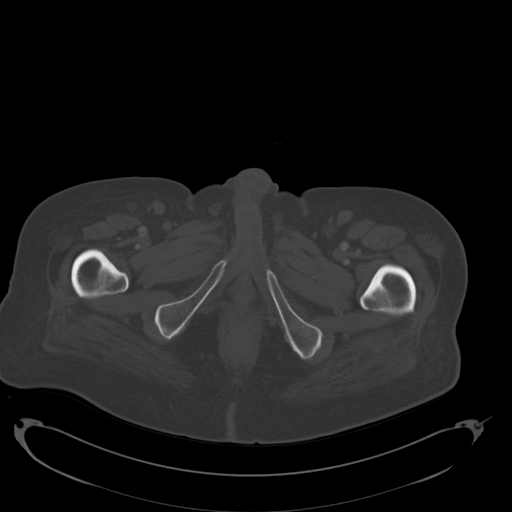
[im 15/135  soft-tissue]
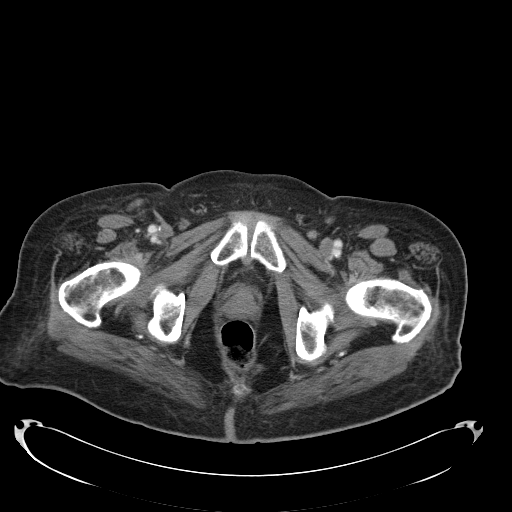
[im 30/135  soft-tissue]
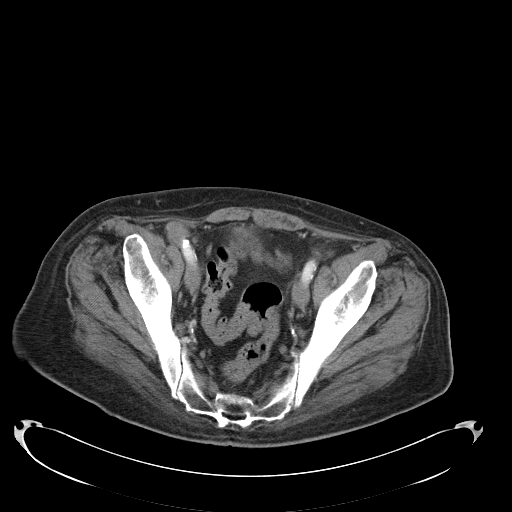
[im 38/135  soft-tissue]
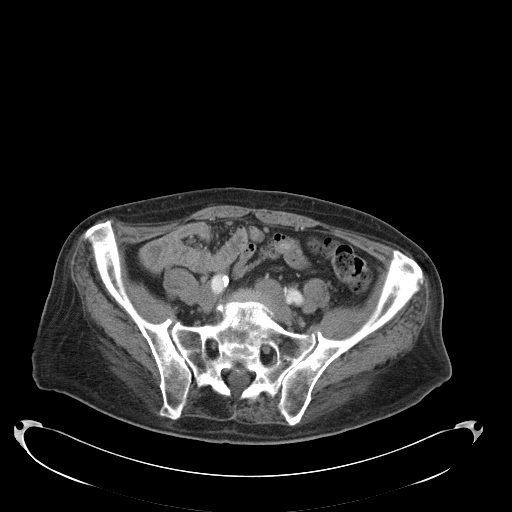
[im 45/135  soft-tissue]
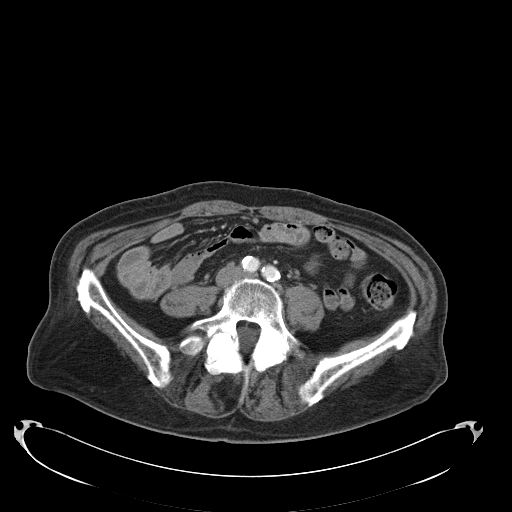
[im 60/135  soft-tissue]
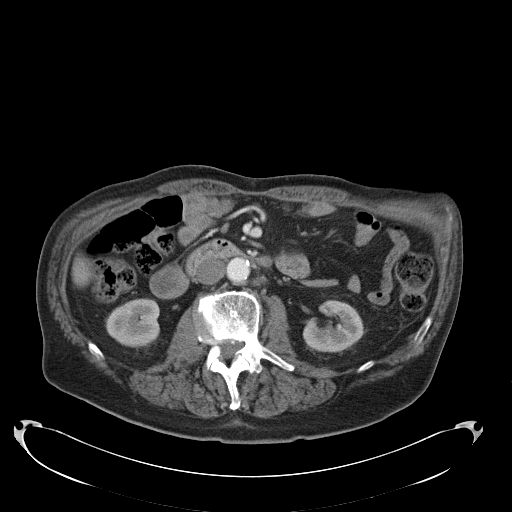
[im 68/135  soft-tissue]
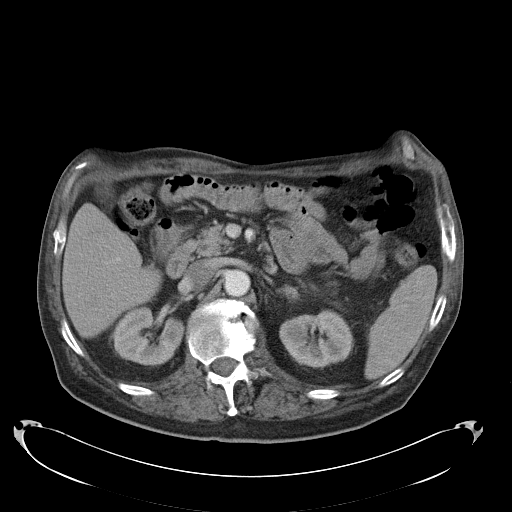
[im 75/135  soft-tissue]
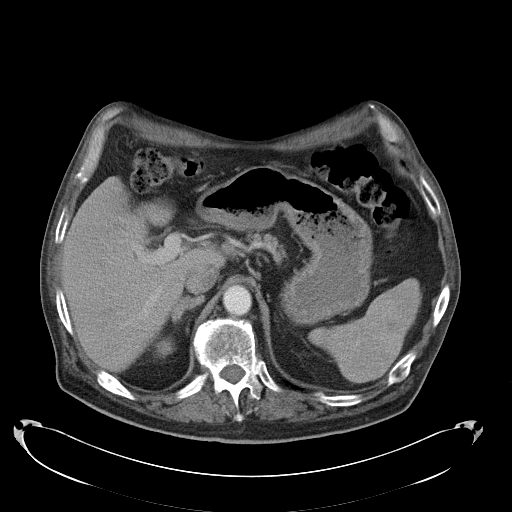
[im 90/135  soft-tissue]
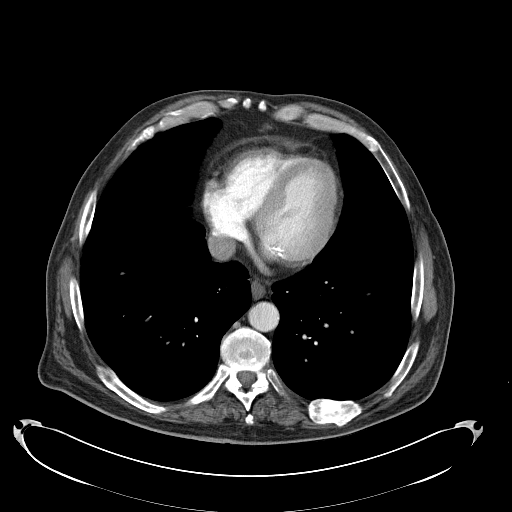
[im 90/135  bone]
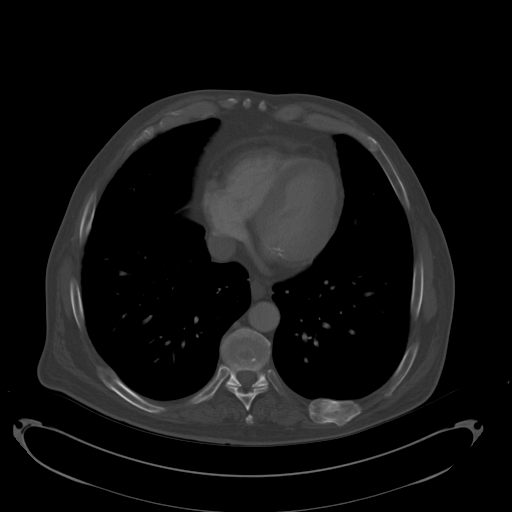
[im 97/135  soft-tissue]
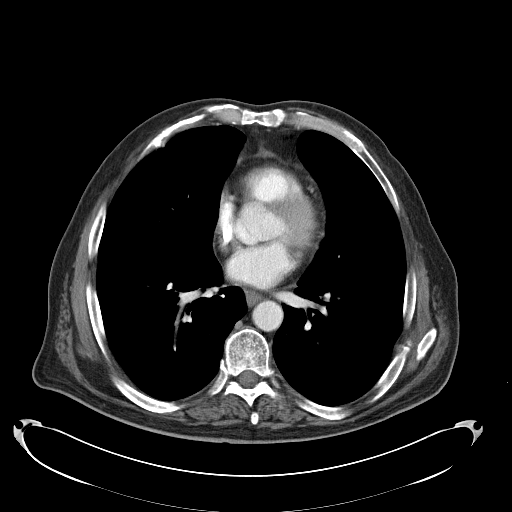
[im 105/135  soft-tissue]
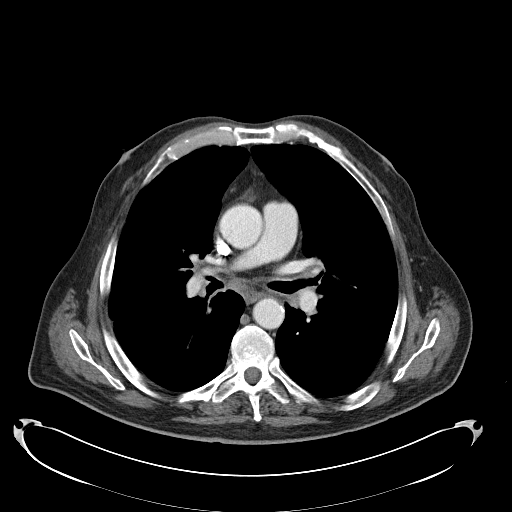
[im 120/135  soft-tissue]
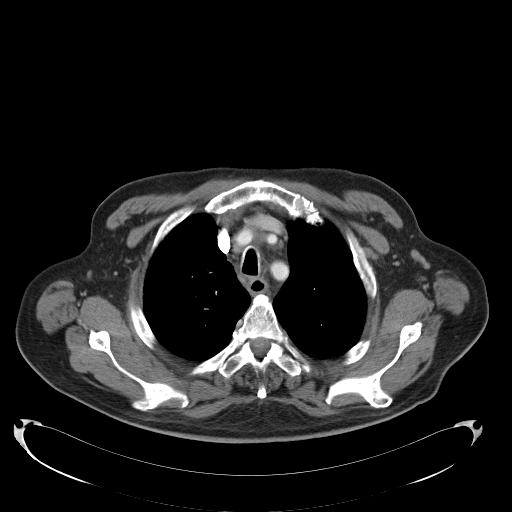
[im 127/135  soft-tissue]
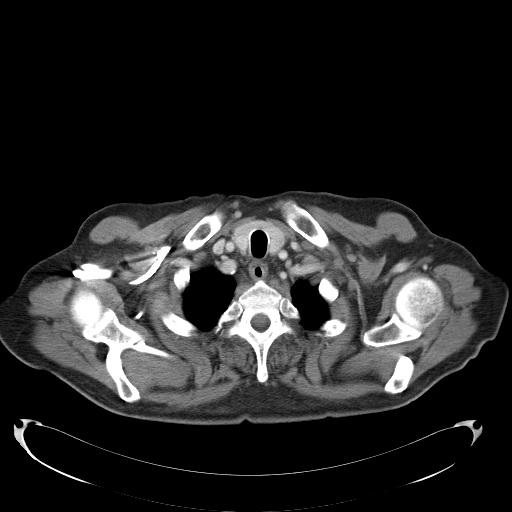

[Series 602: <mpr thick range> · coronal · 1.32mm/px · 3 of 99 slices shown]
[im 33/99  soft-tissue]
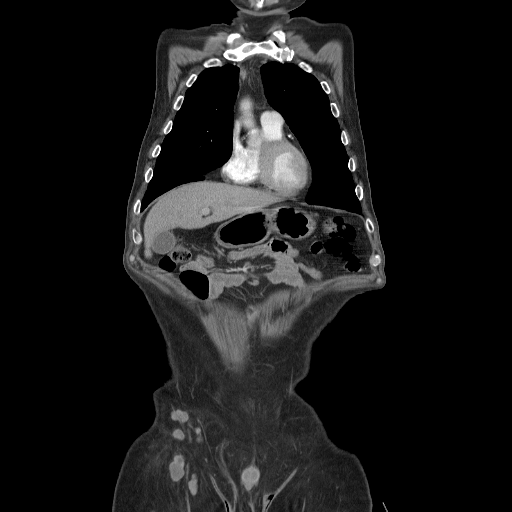
[im 44/99  soft-tissue]
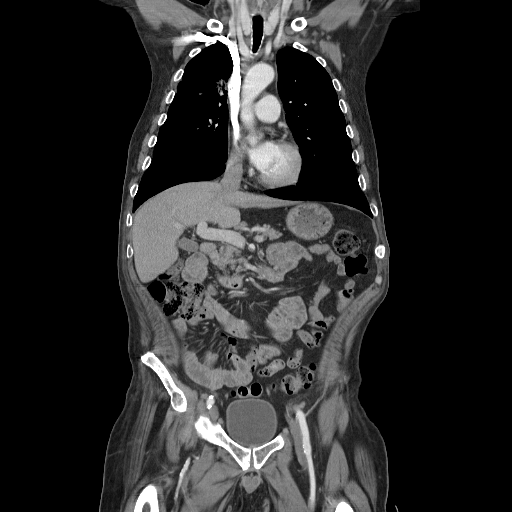
[im 55/99  soft-tissue]
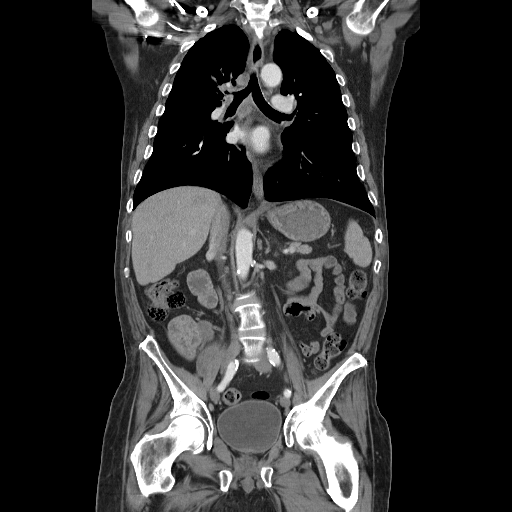

[16 of 46 positions shown; findings below may reference images not displayed]

FINDINGS: CT CHEST FINDINGS

Mediastinum/Hilar Regions: Mild mediastinal lymphadenopathy shows no
significant change, with largest lymph nodes in the AP window
measuring 1.4 cm on image 25, unchanged from prior. Mild right hilar
lymphadenopathy also stable. No new areas of lymphadenopathy
identified.

Other Thoracic Lymphadenopathy:  None.

Lungs: Spiculated nodule in the right lower lobe currently measures
1.8 x 2.6 cm on image 29 of series 4, without significant change
compared to prior exam. Mild paramediastinal opacity in the right
upper and middle lobes is most consistent with radiation change. 6
mm pulmonary nodule in the left lower lobe on image 47 is stable. No
new or enlarging pulmonary nodules or masses identified.

Pleura:  No evidence of effusion or mass.

Vascular/Cardiac:  No acute findings identified.

Other:  None.

Musculoskeletal: No suspicious bone lesions identified. Multiple old
left rib fracture deformities are unchanged in appearance PET

CT ABDOMEN AND PELVIS FINDINGS

Hepatobiliary: No masses or other significant abnormality
identified.

Pancreas: No mass, inflammatory changes, or other parenchymal
abnormality identified.

Spleen:  Within normal limits in size and appearance.

Adrenal Glands: Bilateral adrenal masses show no significant change
in size or appearance 3.2 cm on the right and 2.9 cm left.

Kidneys/Urinary Tract: No masses identified. No evidence of
hydronephrosis.

Stomach/Bowel/Peritoneum: No evidence of wall thickening, mass, or
obstruction.

Vascular/Lymphatic: No pathologically enlarged lymph nodes
identified within the abdomen. Mild right external iliac
lymphadenopathy measures 1.8 cm on image 106 of series 2 compared to
1.2 cm previously. Shotty bilateral inguinal lymph nodes also seen,
largest on the right measuring 1.5 cm on image 125, series 2
compared to 1.3 cm previously.

Reproductive:  No mass or other significant abnormality identified.

Other:  None.

Musculoskeletal:  No suspicious bone lesions identified.
IMPRESSION: No significant change in size or appearance of primary carcinoma in
the superior segment of the right lower lobe.

Stable mild mediastinal and right hilar lymphadenopathy.

Stable bilateral adrenal metastases.

Minimal increase in size of several right external iliac and
inguinal lymph nodes. Slight progression in metastatic disease
cannot be excluded. No other new or progressive disease identified.
